# Patient Record
Sex: Male | Born: 1940
Health system: Southern US, Community
[De-identification: ages and names within clinical notes are randomized; demographics above are authoritative.]

## PROBLEM LIST (undated history)

## (undated) DIAGNOSIS — C801 Malignant (primary) neoplasm, unspecified: Secondary | ICD-10-CM

## (undated) DIAGNOSIS — M1990 Unspecified osteoarthritis, unspecified site: Secondary | ICD-10-CM

## (undated) DIAGNOSIS — G4730 Sleep apnea, unspecified: Secondary | ICD-10-CM

## (undated) DIAGNOSIS — M9260 Juvenile osteochondrosis of tarsus, unspecified ankle: Secondary | ICD-10-CM

## (undated) DIAGNOSIS — E785 Hyperlipidemia, unspecified: Secondary | ICD-10-CM

## (undated) DIAGNOSIS — I10 Essential (primary) hypertension: Secondary | ICD-10-CM

## (undated) DIAGNOSIS — K219 Gastro-esophageal reflux disease without esophagitis: Secondary | ICD-10-CM

## (undated) DIAGNOSIS — E119 Type 2 diabetes mellitus without complications: Secondary | ICD-10-CM

## (undated) DIAGNOSIS — Z87442 Personal history of urinary calculi: Secondary | ICD-10-CM

## (undated) DIAGNOSIS — C61 Malignant neoplasm of prostate: Secondary | ICD-10-CM

## (undated) HISTORY — DX: Type 2 diabetes mellitus without complications: E11.9

## (undated) HISTORY — PX: OTHER SURGICAL HISTORY: SHX169

## (undated) HISTORY — DX: Essential (primary) hypertension: I10

## (undated) HISTORY — PX: APPENDECTOMY: SHX54

## (undated) HISTORY — DX: Hyperlipidemia, unspecified: E78.5

## (undated) HISTORY — DX: Gastro-esophageal reflux disease without esophagitis: K21.9

## (undated) HISTORY — PX: REPLACEMENT TOTAL KNEE: SUR1224

---

## 2006-09-04 HISTORY — PX: COLONOSCOPY: SHX174

## 2007-09-05 HISTORY — PX: JOINT REPLACEMENT: SHX530

## 2010-09-04 HISTORY — PX: PROSTATE BIOPSY: SHX241

## 2011-09-12 DIAGNOSIS — R972 Elevated prostate specific antigen [PSA]: Secondary | ICD-10-CM | POA: Diagnosis not present

## 2011-09-21 DIAGNOSIS — N318 Other neuromuscular dysfunction of bladder: Secondary | ICD-10-CM | POA: Diagnosis not present

## 2011-09-21 DIAGNOSIS — R972 Elevated prostate specific antigen [PSA]: Secondary | ICD-10-CM | POA: Diagnosis not present

## 2011-10-06 DIAGNOSIS — E119 Type 2 diabetes mellitus without complications: Secondary | ICD-10-CM | POA: Diagnosis not present

## 2011-10-06 DIAGNOSIS — D510 Vitamin B12 deficiency anemia due to intrinsic factor deficiency: Secondary | ICD-10-CM | POA: Diagnosis not present

## 2012-03-27 DIAGNOSIS — Z0000 Encounter for general adult medical examination without abnormal findings: Secondary | ICD-10-CM | POA: Diagnosis not present

## 2012-03-27 DIAGNOSIS — D510 Vitamin B12 deficiency anemia due to intrinsic factor deficiency: Secondary | ICD-10-CM | POA: Diagnosis not present

## 2012-03-27 DIAGNOSIS — Z79899 Other long term (current) drug therapy: Secondary | ICD-10-CM | POA: Diagnosis not present

## 2012-03-27 DIAGNOSIS — E119 Type 2 diabetes mellitus without complications: Secondary | ICD-10-CM | POA: Diagnosis not present

## 2012-03-27 DIAGNOSIS — Z125 Encounter for screening for malignant neoplasm of prostate: Secondary | ICD-10-CM | POA: Diagnosis not present

## 2012-05-08 DIAGNOSIS — Z1211 Encounter for screening for malignant neoplasm of colon: Secondary | ICD-10-CM | POA: Diagnosis not present

## 2012-05-20 DIAGNOSIS — D128 Benign neoplasm of rectum: Secondary | ICD-10-CM | POA: Diagnosis not present

## 2012-05-20 DIAGNOSIS — Z1211 Encounter for screening for malignant neoplasm of colon: Secondary | ICD-10-CM | POA: Diagnosis not present

## 2012-05-20 DIAGNOSIS — K620 Anal polyp: Secondary | ICD-10-CM | POA: Diagnosis not present

## 2012-05-20 DIAGNOSIS — K621 Rectal polyp: Secondary | ICD-10-CM | POA: Diagnosis not present

## 2012-05-20 DIAGNOSIS — Z800 Family history of malignant neoplasm of digestive organs: Secondary | ICD-10-CM | POA: Diagnosis not present

## 2012-05-20 DIAGNOSIS — K648 Other hemorrhoids: Secondary | ICD-10-CM | POA: Diagnosis not present

## 2012-05-20 DIAGNOSIS — Z86010 Personal history of colonic polyps: Secondary | ICD-10-CM | POA: Diagnosis not present

## 2012-05-20 DIAGNOSIS — K5730 Diverticulosis of large intestine without perforation or abscess without bleeding: Secondary | ICD-10-CM | POA: Diagnosis not present

## 2012-05-20 DIAGNOSIS — D129 Benign neoplasm of anus and anal canal: Secondary | ICD-10-CM | POA: Diagnosis not present

## 2012-05-21 DIAGNOSIS — R030 Elevated blood-pressure reading, without diagnosis of hypertension: Secondary | ICD-10-CM | POA: Diagnosis not present

## 2012-07-04 DIAGNOSIS — IMO0002 Reserved for concepts with insufficient information to code with codable children: Secondary | ICD-10-CM | POA: Diagnosis not present

## 2012-07-04 DIAGNOSIS — M1710 Unilateral primary osteoarthritis, unspecified knee: Secondary | ICD-10-CM | POA: Diagnosis not present

## 2012-07-16 DIAGNOSIS — M1710 Unilateral primary osteoarthritis, unspecified knee: Secondary | ICD-10-CM | POA: Diagnosis not present

## 2012-07-16 DIAGNOSIS — IMO0002 Reserved for concepts with insufficient information to code with codable children: Secondary | ICD-10-CM | POA: Diagnosis not present

## 2012-08-12 ENCOUNTER — Ambulatory Visit: Payer: Self-pay | Admitting: General Practice

## 2012-08-12 LAB — URINALYSIS, COMPLETE
Bacteria: NONE SEEN
Blood: NEGATIVE
Leukocyte Esterase: NEGATIVE
Nitrite: NEGATIVE
Ph: 5 (ref 4.5–8.0)
Protein: NEGATIVE
RBC,UR: 1 /HPF (ref 0–5)
Squamous Epithelial: NONE SEEN

## 2012-08-12 LAB — BASIC METABOLIC PANEL
Anion Gap: 7 (ref 7–16)
BUN: 18 mg/dL (ref 7–18)
Calcium, Total: 9.4 mg/dL (ref 8.5–10.1)
Creatinine: 0.81 mg/dL (ref 0.60–1.30)
EGFR (Non-African Amer.): >60
Glucose: 214 mg/dL — ABNORMAL HIGH (ref 65–99)
Osmolality: 280 (ref 275–301)
Potassium: 4.1 mmol/L (ref 3.5–5.1)

## 2012-08-12 LAB — CBC
HGB: 15.8 g/dL (ref 13.0–18.0)
MCH: 34.1 pg — ABNORMAL HIGH (ref 26.0–34.0)
MCHC: 34.2 g/dL (ref 32.0–36.0)
MCV: 100 fL (ref 80–100)
Platelet: 192 10*3/uL (ref 150–440)
RBC: 4.63 10*6/uL (ref 4.40–5.90)
RDW: 12.5 % (ref 11.5–14.5)

## 2012-08-12 LAB — APTT: Activated PTT: 32.7 secs (ref 23.6–35.9)

## 2012-08-12 LAB — PROTIME-INR
INR: 1
Prothrombin Time: 13.4 secs (ref 11.5–14.7)

## 2012-08-13 LAB — URINE CULTURE

## 2012-08-26 ENCOUNTER — Inpatient Hospital Stay: Payer: Self-pay | Admitting: General Practice

## 2012-08-26 DIAGNOSIS — Z96659 Presence of unspecified artificial knee joint: Secondary | ICD-10-CM | POA: Diagnosis not present

## 2012-08-26 DIAGNOSIS — M1710 Unilateral primary osteoarthritis, unspecified knee: Secondary | ICD-10-CM | POA: Diagnosis not present

## 2012-08-26 DIAGNOSIS — G8918 Other acute postprocedural pain: Secondary | ICD-10-CM | POA: Diagnosis not present

## 2012-08-26 DIAGNOSIS — Z471 Aftercare following joint replacement surgery: Secondary | ICD-10-CM | POA: Diagnosis not present

## 2012-08-26 DIAGNOSIS — M25569 Pain in unspecified knee: Secondary | ICD-10-CM | POA: Diagnosis not present

## 2012-08-26 DIAGNOSIS — IMO0002 Reserved for concepts with insufficient information to code with codable children: Secondary | ICD-10-CM | POA: Diagnosis not present

## 2012-08-27 LAB — BASIC METABOLIC PANEL
Anion Gap: 8 (ref 7–16)
Chloride: 106 mmol/L (ref 98–107)
Creatinine: 0.8 mg/dL (ref 0.60–1.30)
EGFR (Non-African Amer.): >60
Glucose: 188 mg/dL — ABNORMAL HIGH (ref 65–99)
Osmolality: 280 (ref 275–301)

## 2012-08-27 LAB — PLATELET COUNT: Platelet: 169 10*3/uL (ref 150–440)

## 2012-08-28 DIAGNOSIS — R918 Other nonspecific abnormal finding of lung field: Secondary | ICD-10-CM | POA: Diagnosis not present

## 2012-08-28 DIAGNOSIS — I499 Cardiac arrhythmia, unspecified: Secondary | ICD-10-CM

## 2012-08-28 LAB — HEMOGLOBIN: HGB: 13.6 g/dL (ref 13.0–18.0)

## 2012-08-28 LAB — BASIC METABOLIC PANEL
Anion Gap: 6 — ABNORMAL LOW (ref 7–16)
BUN: 8 mg/dL (ref 7–18)
Calcium, Total: 8.3 mg/dL — ABNORMAL LOW (ref 8.5–10.1)
Chloride: 103 mmol/L (ref 98–107)
Co2: 25 mmol/L (ref 21–32)
Creatinine: 0.61 mg/dL (ref 0.60–1.30)
EGFR (African American): >60
Osmolality: 271 (ref 275–301)
Potassium: 3.8 mmol/L (ref 3.5–5.1)

## 2012-08-28 LAB — PLATELET COUNT: Platelet: 181 10*3/uL (ref 150–440)

## 2012-08-31 DIAGNOSIS — Z471 Aftercare following joint replacement surgery: Secondary | ICD-10-CM | POA: Diagnosis not present

## 2012-08-31 DIAGNOSIS — Z96659 Presence of unspecified artificial knee joint: Secondary | ICD-10-CM | POA: Diagnosis not present

## 2012-08-31 DIAGNOSIS — IMO0001 Reserved for inherently not codable concepts without codable children: Secondary | ICD-10-CM | POA: Diagnosis not present

## 2012-08-31 DIAGNOSIS — N189 Chronic kidney disease, unspecified: Secondary | ICD-10-CM | POA: Diagnosis not present

## 2012-08-31 DIAGNOSIS — E119 Type 2 diabetes mellitus without complications: Secondary | ICD-10-CM | POA: Diagnosis not present

## 2012-09-02 DIAGNOSIS — IMO0001 Reserved for inherently not codable concepts without codable children: Secondary | ICD-10-CM | POA: Diagnosis not present

## 2012-09-02 DIAGNOSIS — E119 Type 2 diabetes mellitus without complications: Secondary | ICD-10-CM | POA: Diagnosis not present

## 2012-09-02 DIAGNOSIS — Z471 Aftercare following joint replacement surgery: Secondary | ICD-10-CM | POA: Diagnosis not present

## 2012-09-02 DIAGNOSIS — Z96659 Presence of unspecified artificial knee joint: Secondary | ICD-10-CM | POA: Diagnosis not present

## 2012-09-02 DIAGNOSIS — N189 Chronic kidney disease, unspecified: Secondary | ICD-10-CM | POA: Diagnosis not present

## 2012-09-03 DIAGNOSIS — Z471 Aftercare following joint replacement surgery: Secondary | ICD-10-CM | POA: Diagnosis not present

## 2012-09-03 DIAGNOSIS — IMO0001 Reserved for inherently not codable concepts without codable children: Secondary | ICD-10-CM | POA: Diagnosis not present

## 2012-09-03 DIAGNOSIS — E119 Type 2 diabetes mellitus without complications: Secondary | ICD-10-CM | POA: Diagnosis not present

## 2012-09-03 DIAGNOSIS — N189 Chronic kidney disease, unspecified: Secondary | ICD-10-CM | POA: Diagnosis not present

## 2012-09-03 DIAGNOSIS — Z96659 Presence of unspecified artificial knee joint: Secondary | ICD-10-CM | POA: Diagnosis not present

## 2012-09-04 DIAGNOSIS — N189 Chronic kidney disease, unspecified: Secondary | ICD-10-CM | POA: Diagnosis not present

## 2012-09-04 DIAGNOSIS — Z96659 Presence of unspecified artificial knee joint: Secondary | ICD-10-CM | POA: Diagnosis not present

## 2012-09-04 DIAGNOSIS — Z471 Aftercare following joint replacement surgery: Secondary | ICD-10-CM | POA: Diagnosis not present

## 2012-09-04 DIAGNOSIS — IMO0001 Reserved for inherently not codable concepts without codable children: Secondary | ICD-10-CM | POA: Diagnosis not present

## 2012-09-04 DIAGNOSIS — E119 Type 2 diabetes mellitus without complications: Secondary | ICD-10-CM | POA: Diagnosis not present

## 2012-09-05 ENCOUNTER — Emergency Department: Payer: Self-pay | Admitting: Emergency Medicine

## 2012-09-05 DIAGNOSIS — R61 Generalized hyperhidrosis: Secondary | ICD-10-CM | POA: Diagnosis not present

## 2012-09-05 DIAGNOSIS — Z79899 Other long term (current) drug therapy: Secondary | ICD-10-CM | POA: Diagnosis not present

## 2012-09-05 DIAGNOSIS — I10 Essential (primary) hypertension: Secondary | ICD-10-CM | POA: Diagnosis not present

## 2012-09-05 DIAGNOSIS — R000 Tachycardia, unspecified: Secondary | ICD-10-CM | POA: Diagnosis not present

## 2012-09-05 DIAGNOSIS — Z471 Aftercare following joint replacement surgery: Secondary | ICD-10-CM | POA: Diagnosis not present

## 2012-09-05 DIAGNOSIS — IMO0001 Reserved for inherently not codable concepts without codable children: Secondary | ICD-10-CM | POA: Diagnosis not present

## 2012-09-05 DIAGNOSIS — Z96659 Presence of unspecified artificial knee joint: Secondary | ICD-10-CM | POA: Diagnosis not present

## 2012-09-05 DIAGNOSIS — E119 Type 2 diabetes mellitus without complications: Secondary | ICD-10-CM | POA: Diagnosis not present

## 2012-09-05 DIAGNOSIS — N189 Chronic kidney disease, unspecified: Secondary | ICD-10-CM | POA: Diagnosis not present

## 2012-09-05 DIAGNOSIS — Z9889 Other specified postprocedural states: Secondary | ICD-10-CM | POA: Diagnosis not present

## 2012-09-05 LAB — BASIC METABOLIC PANEL
Anion Gap: 8 (ref 7–16)
BUN: 12 mg/dL (ref 7–18)
Calcium, Total: 9.5 mg/dL (ref 8.5–10.1)
Co2: 25 mmol/L (ref 21–32)
Creatinine: 0.81 mg/dL (ref 0.60–1.30)
EGFR (African American): >60
EGFR (Non-African Amer.): >60
Glucose: 143 mg/dL — ABNORMAL HIGH (ref 65–99)
Sodium: 133 mmol/L — ABNORMAL LOW (ref 136–145)

## 2012-09-05 LAB — CBC WITH DIFFERENTIAL/PLATELET
Basophil #: 0.1 10*3/uL (ref 0.0–0.1)
Basophil %: 0.8 %
Eosinophil #: 0.2 10*3/uL (ref 0.0–0.7)
HCT: 44.2 % (ref 40.0–52.0)
HGB: 15 g/dL (ref 13.0–18.0)
MCH: 33.4 pg (ref 26.0–34.0)
MCHC: 34 g/dL (ref 32.0–36.0)
MCV: 98 fL (ref 80–100)
Monocyte #: 1.1 x10 3/mm — ABNORMAL HIGH (ref 0.2–1.0)
Neutrophil #: 10.2 10*3/uL — ABNORMAL HIGH (ref 1.4–6.5)
Platelet: 356 10*3/uL (ref 150–440)
RBC: 4.5 10*6/uL (ref 4.40–5.90)
RDW: 12.7 % (ref 11.5–14.5)
WBC: 13.4 10*3/uL — ABNORMAL HIGH (ref 3.8–10.6)

## 2012-09-05 LAB — URINALYSIS, COMPLETE
Bacteria: NONE SEEN
Bilirubin,UR: NEGATIVE
Glucose,UR: NEGATIVE mg/dL (ref 0–75)
Ketone: NEGATIVE
Ph: 6 (ref 4.5–8.0)
RBC,UR: 1 /HPF (ref 0–5)
Specific Gravity: 1.005 (ref 1.003–1.030)
Squamous Epithelial: <1
WBC UR: 3 /HPF (ref 0–5)

## 2012-09-05 LAB — TROPONIN I: Troponin-I: <0.02 ng/mL

## 2012-09-06 DIAGNOSIS — Z471 Aftercare following joint replacement surgery: Secondary | ICD-10-CM | POA: Diagnosis not present

## 2012-09-06 DIAGNOSIS — Z96659 Presence of unspecified artificial knee joint: Secondary | ICD-10-CM | POA: Diagnosis not present

## 2012-09-06 DIAGNOSIS — E119 Type 2 diabetes mellitus without complications: Secondary | ICD-10-CM | POA: Diagnosis not present

## 2012-09-06 DIAGNOSIS — N189 Chronic kidney disease, unspecified: Secondary | ICD-10-CM | POA: Diagnosis not present

## 2012-09-06 DIAGNOSIS — IMO0001 Reserved for inherently not codable concepts without codable children: Secondary | ICD-10-CM | POA: Diagnosis not present

## 2012-09-09 DIAGNOSIS — Z96659 Presence of unspecified artificial knee joint: Secondary | ICD-10-CM | POA: Diagnosis not present

## 2012-09-09 DIAGNOSIS — Z471 Aftercare following joint replacement surgery: Secondary | ICD-10-CM | POA: Diagnosis not present

## 2012-09-09 DIAGNOSIS — E119 Type 2 diabetes mellitus without complications: Secondary | ICD-10-CM | POA: Diagnosis not present

## 2012-09-09 DIAGNOSIS — N189 Chronic kidney disease, unspecified: Secondary | ICD-10-CM | POA: Diagnosis not present

## 2012-09-09 DIAGNOSIS — IMO0001 Reserved for inherently not codable concepts without codable children: Secondary | ICD-10-CM | POA: Diagnosis not present

## 2012-09-10 DIAGNOSIS — Z96659 Presence of unspecified artificial knee joint: Secondary | ICD-10-CM | POA: Diagnosis not present

## 2012-09-10 DIAGNOSIS — M25569 Pain in unspecified knee: Secondary | ICD-10-CM | POA: Diagnosis not present

## 2012-09-10 DIAGNOSIS — M25669 Stiffness of unspecified knee, not elsewhere classified: Secondary | ICD-10-CM | POA: Diagnosis not present

## 2012-09-10 DIAGNOSIS — M6281 Muscle weakness (generalized): Secondary | ICD-10-CM | POA: Diagnosis not present

## 2012-09-13 DIAGNOSIS — M25669 Stiffness of unspecified knee, not elsewhere classified: Secondary | ICD-10-CM | POA: Diagnosis not present

## 2012-09-13 DIAGNOSIS — Z96659 Presence of unspecified artificial knee joint: Secondary | ICD-10-CM | POA: Diagnosis not present

## 2012-09-13 DIAGNOSIS — M6281 Muscle weakness (generalized): Secondary | ICD-10-CM | POA: Diagnosis not present

## 2012-09-13 DIAGNOSIS — M25569 Pain in unspecified knee: Secondary | ICD-10-CM | POA: Diagnosis not present

## 2012-09-16 DIAGNOSIS — M25669 Stiffness of unspecified knee, not elsewhere classified: Secondary | ICD-10-CM | POA: Diagnosis not present

## 2012-09-16 DIAGNOSIS — M6281 Muscle weakness (generalized): Secondary | ICD-10-CM | POA: Diagnosis not present

## 2012-09-16 DIAGNOSIS — Z96659 Presence of unspecified artificial knee joint: Secondary | ICD-10-CM | POA: Diagnosis not present

## 2012-09-16 DIAGNOSIS — M25569 Pain in unspecified knee: Secondary | ICD-10-CM | POA: Diagnosis not present

## 2012-09-19 DIAGNOSIS — M25669 Stiffness of unspecified knee, not elsewhere classified: Secondary | ICD-10-CM | POA: Diagnosis not present

## 2012-09-19 DIAGNOSIS — M6281 Muscle weakness (generalized): Secondary | ICD-10-CM | POA: Diagnosis not present

## 2012-09-19 DIAGNOSIS — Z96659 Presence of unspecified artificial knee joint: Secondary | ICD-10-CM | POA: Diagnosis not present

## 2012-09-19 DIAGNOSIS — M25569 Pain in unspecified knee: Secondary | ICD-10-CM | POA: Diagnosis not present

## 2012-09-23 DIAGNOSIS — M25569 Pain in unspecified knee: Secondary | ICD-10-CM | POA: Diagnosis not present

## 2012-09-23 DIAGNOSIS — M25669 Stiffness of unspecified knee, not elsewhere classified: Secondary | ICD-10-CM | POA: Diagnosis not present

## 2012-09-23 DIAGNOSIS — M6281 Muscle weakness (generalized): Secondary | ICD-10-CM | POA: Diagnosis not present

## 2012-09-23 DIAGNOSIS — Z96659 Presence of unspecified artificial knee joint: Secondary | ICD-10-CM | POA: Diagnosis not present

## 2012-09-27 DIAGNOSIS — Z96659 Presence of unspecified artificial knee joint: Secondary | ICD-10-CM | POA: Diagnosis not present

## 2012-09-27 DIAGNOSIS — M25669 Stiffness of unspecified knee, not elsewhere classified: Secondary | ICD-10-CM | POA: Diagnosis not present

## 2012-09-27 DIAGNOSIS — M25569 Pain in unspecified knee: Secondary | ICD-10-CM | POA: Diagnosis not present

## 2012-09-27 DIAGNOSIS — M6281 Muscle weakness (generalized): Secondary | ICD-10-CM | POA: Diagnosis not present

## 2012-09-30 DIAGNOSIS — Z96659 Presence of unspecified artificial knee joint: Secondary | ICD-10-CM | POA: Diagnosis not present

## 2012-09-30 DIAGNOSIS — M25569 Pain in unspecified knee: Secondary | ICD-10-CM | POA: Diagnosis not present

## 2012-09-30 DIAGNOSIS — M25669 Stiffness of unspecified knee, not elsewhere classified: Secondary | ICD-10-CM | POA: Diagnosis not present

## 2012-09-30 DIAGNOSIS — M6281 Muscle weakness (generalized): Secondary | ICD-10-CM | POA: Diagnosis not present

## 2012-10-07 DIAGNOSIS — M25669 Stiffness of unspecified knee, not elsewhere classified: Secondary | ICD-10-CM | POA: Diagnosis not present

## 2012-10-07 DIAGNOSIS — M25569 Pain in unspecified knee: Secondary | ICD-10-CM | POA: Diagnosis not present

## 2012-10-07 DIAGNOSIS — M6281 Muscle weakness (generalized): Secondary | ICD-10-CM | POA: Diagnosis not present

## 2012-10-07 DIAGNOSIS — Z96659 Presence of unspecified artificial knee joint: Secondary | ICD-10-CM | POA: Diagnosis not present

## 2012-10-08 DIAGNOSIS — Z96659 Presence of unspecified artificial knee joint: Secondary | ICD-10-CM | POA: Diagnosis not present

## 2012-10-14 DIAGNOSIS — E1149 Type 2 diabetes mellitus with other diabetic neurological complication: Secondary | ICD-10-CM | POA: Diagnosis not present

## 2012-10-14 DIAGNOSIS — B351 Tinea unguium: Secondary | ICD-10-CM | POA: Diagnosis not present

## 2012-10-14 DIAGNOSIS — L851 Acquired keratosis [keratoderma] palmaris et plantaris: Secondary | ICD-10-CM | POA: Diagnosis not present

## 2012-10-14 DIAGNOSIS — G909 Disorder of the autonomic nervous system, unspecified: Secondary | ICD-10-CM | POA: Diagnosis not present

## 2012-11-27 DIAGNOSIS — E119 Type 2 diabetes mellitus without complications: Secondary | ICD-10-CM | POA: Diagnosis not present

## 2013-01-13 DIAGNOSIS — M7730 Calcaneal spur, unspecified foot: Secondary | ICD-10-CM | POA: Diagnosis not present

## 2013-01-13 DIAGNOSIS — M7660 Achilles tendinitis, unspecified leg: Secondary | ICD-10-CM | POA: Diagnosis not present

## 2013-02-26 DIAGNOSIS — M6528 Calcific tendinitis, other site: Secondary | ICD-10-CM | POA: Insufficient documentation

## 2013-02-26 DIAGNOSIS — M79609 Pain in unspecified limb: Secondary | ICD-10-CM | POA: Diagnosis not present

## 2013-02-26 DIAGNOSIS — M7660 Achilles tendinitis, unspecified leg: Secondary | ICD-10-CM | POA: Insufficient documentation

## 2013-02-26 DIAGNOSIS — M7730 Calcaneal spur, unspecified foot: Secondary | ICD-10-CM | POA: Insufficient documentation

## 2013-04-08 DIAGNOSIS — M7660 Achilles tendinitis, unspecified leg: Secondary | ICD-10-CM | POA: Diagnosis not present

## 2013-04-08 DIAGNOSIS — T148XXA Other injury of unspecified body region, initial encounter: Secondary | ICD-10-CM | POA: Diagnosis not present

## 2013-04-08 DIAGNOSIS — M7730 Calcaneal spur, unspecified foot: Secondary | ICD-10-CM | POA: Diagnosis not present

## 2013-04-09 DIAGNOSIS — M659 Synovitis and tenosynovitis, unspecified: Secondary | ICD-10-CM | POA: Diagnosis not present

## 2013-04-09 DIAGNOSIS — M7660 Achilles tendinitis, unspecified leg: Secondary | ICD-10-CM | POA: Diagnosis not present

## 2013-05-01 DIAGNOSIS — N529 Male erectile dysfunction, unspecified: Secondary | ICD-10-CM | POA: Diagnosis not present

## 2013-05-01 DIAGNOSIS — G5600 Carpal tunnel syndrome, unspecified upper limb: Secondary | ICD-10-CM | POA: Diagnosis not present

## 2013-05-20 DIAGNOSIS — M659 Synovitis and tenosynovitis, unspecified: Secondary | ICD-10-CM | POA: Diagnosis not present

## 2013-05-20 DIAGNOSIS — M7660 Achilles tendinitis, unspecified leg: Secondary | ICD-10-CM | POA: Diagnosis not present

## 2013-05-24 IMAGING — CT CT CHEST W/ CM
1 series · 15 of 32 positions shown, 19 images · IV contrast (APPLIED)
Comparison: none

REASON FOR EXAM: TACHYCARDIA AND SWEATING, RECENT SURGERY
COMMENTS:

PROCEDURE:     CT  - CT CHEST (FOR PE) W  - September 05, 2012  [DATE]
RESULT:     Comparison: None
TECHNIQUE: Multiple thin section axial images were obtained from the lung
apices to the upper abdomen following 100 ml Isovue 370 intravenous
contrast, according to the PE protocol. These images were also reviewed on a
[employer] multiplanar work station.

[Series 4: soft tissue · axial · 0.82mm/px · z∈[-345,-84]mm · 15 of 99 slices shown, 19 images]
[im 8/99  mediastinal]
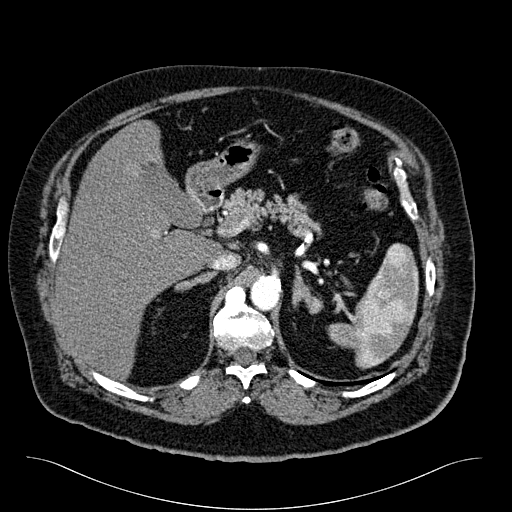
[im 8/99  lung]
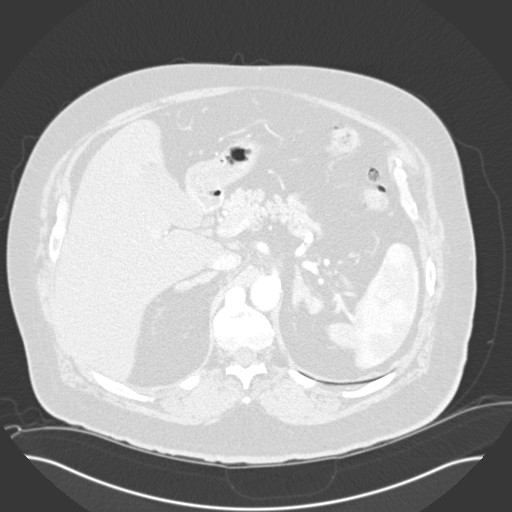
[im 15/99  lung]
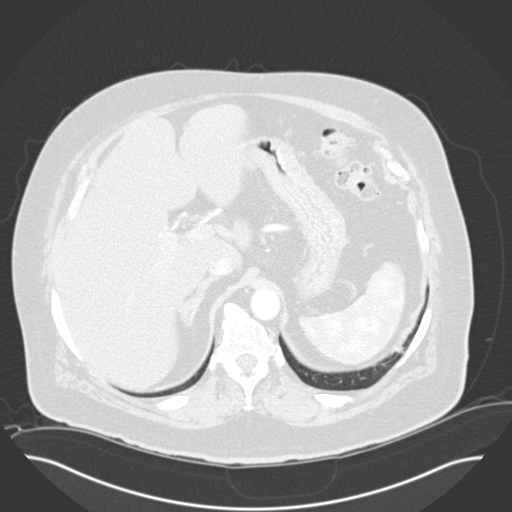
[im 20/99  lung]
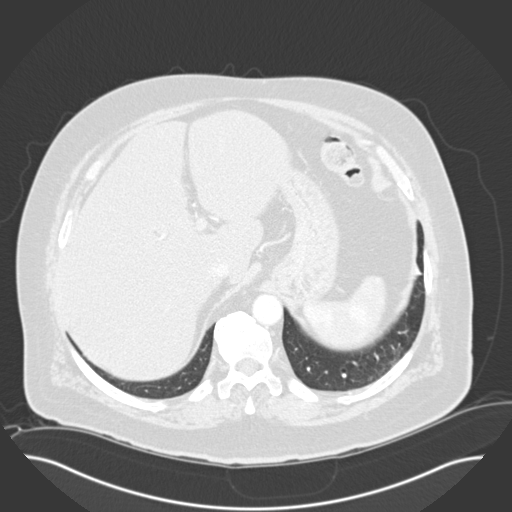
[im 26/99  lung]
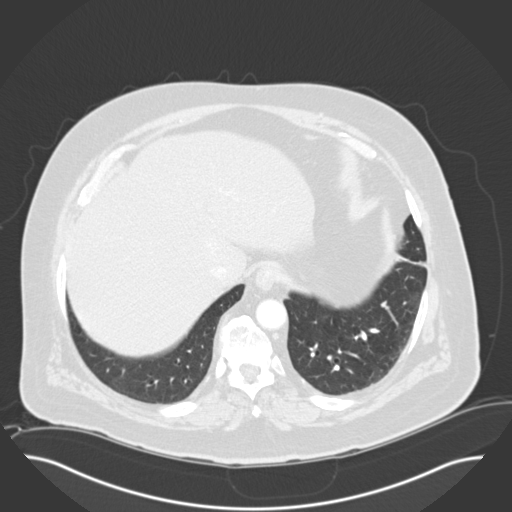
[im 33/99  mediastinal]
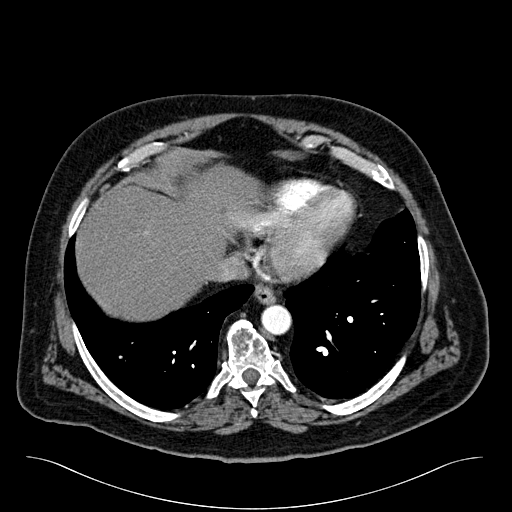
[im 33/99  lung]
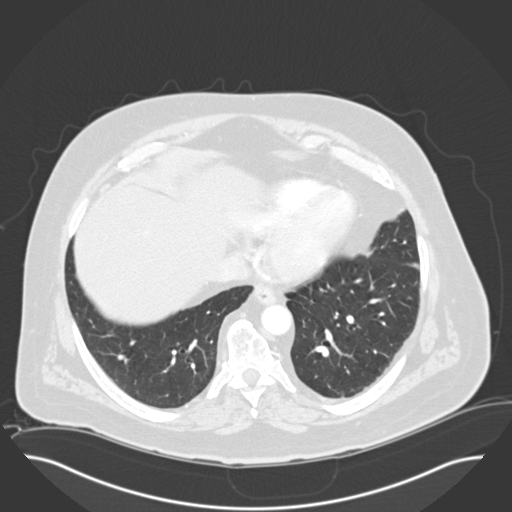
[im 40/99  lung]
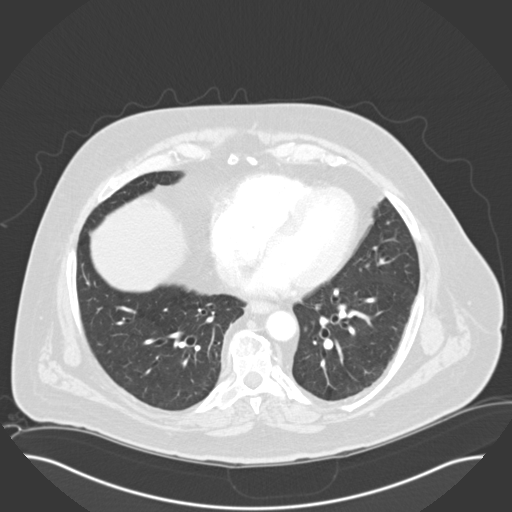
[im 47/99  lung]
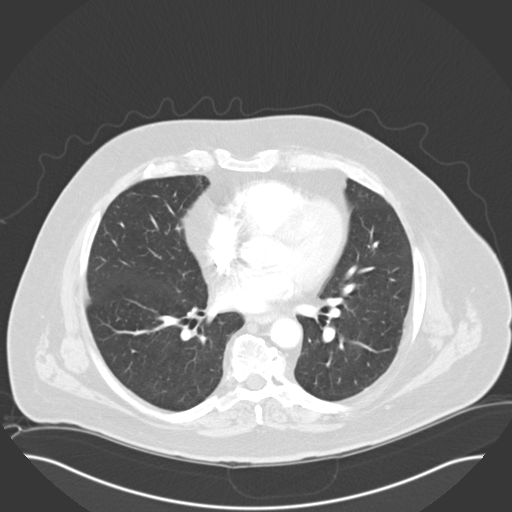
[im 51/99  lung]
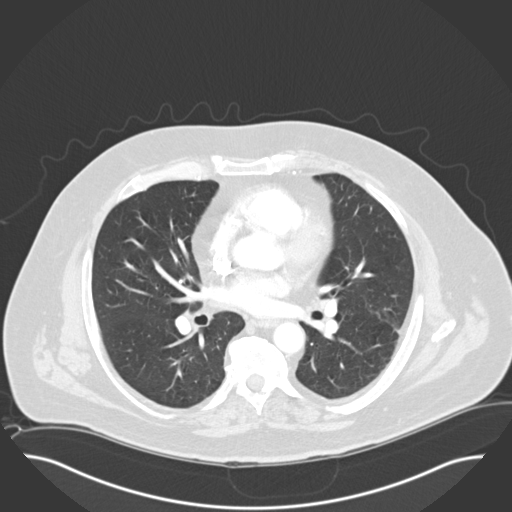
[im 55/99  mediastinal]
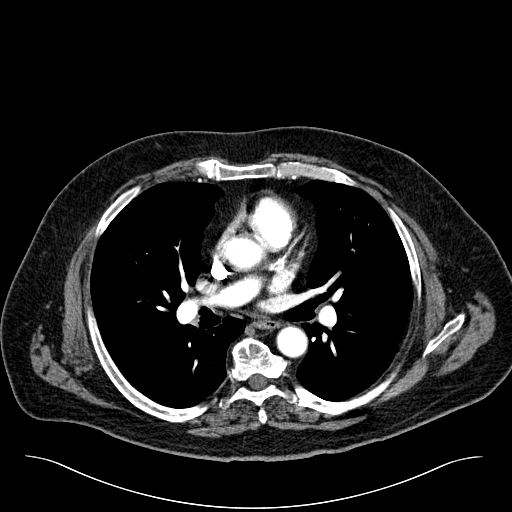
[im 55/99  lung]
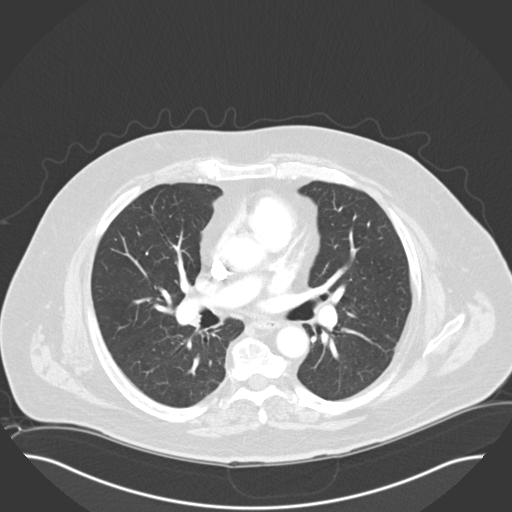
[im 62/99  lung]
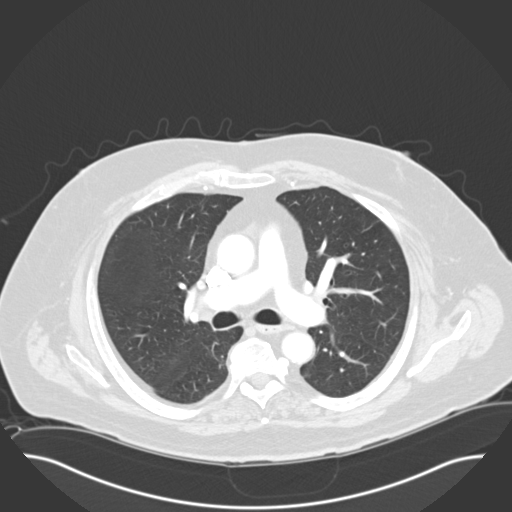
[im 69/99  lung]
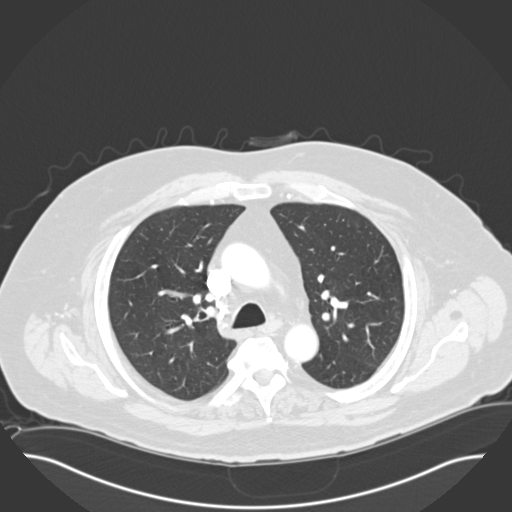
[im 77/99  lung]
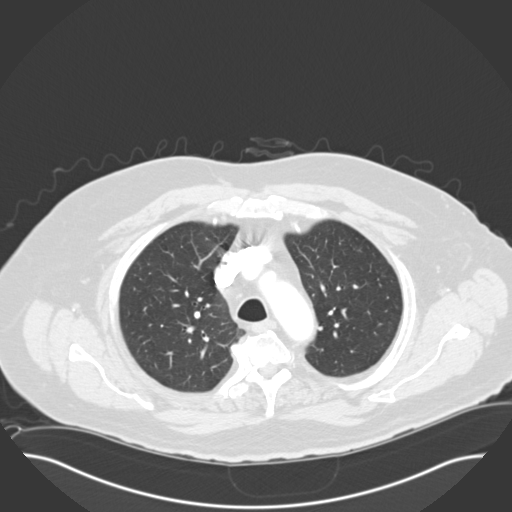
[im 80/99  mediastinal]
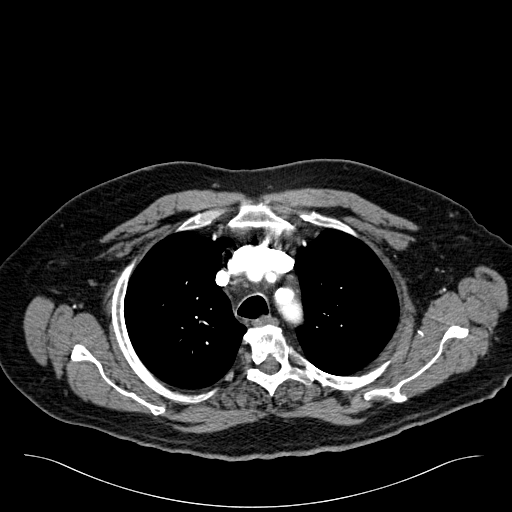
[im 80/99  lung]
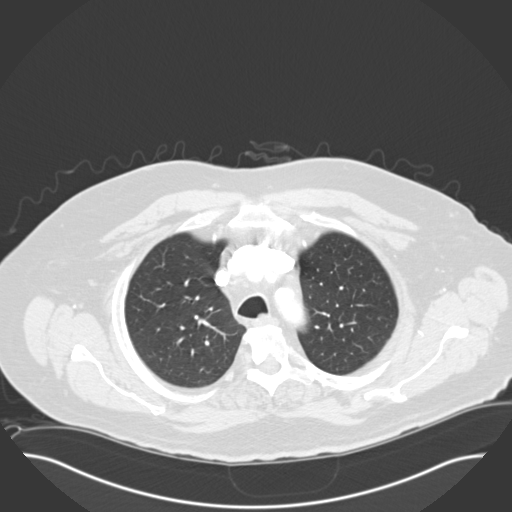
[im 88/99  lung]
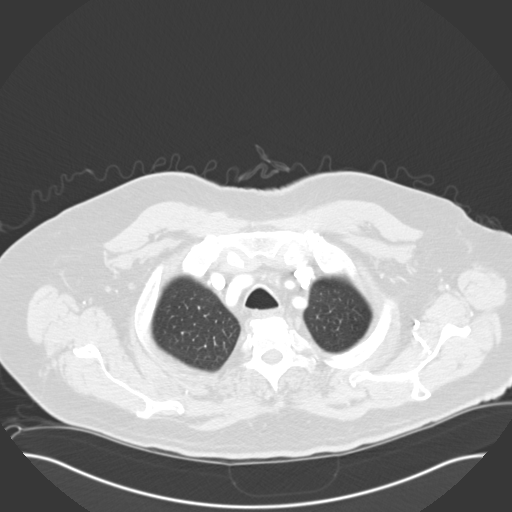
[im 95/99  lung]
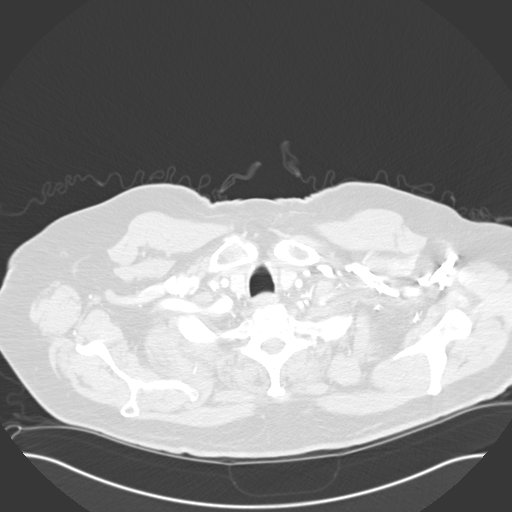

[15 of 32 positions shown; findings below may reference images not displayed]

FINDINGS: No mediastinal, hilar or, or axillary lymphadenopathy. There are
calcifications in the coronary arteries. Calcified stones are present within
the gallbladder. There is mild thickening of the left adrenal gland, without
definite discrete mass.

The thoracic aorta is normal in caliber. There is poor opacification of the
segmental pulmonary arteries in the upper lobes, which limits evaluation in
this region.

Minimal basilar opacities are likely secondary to atelectasis. Minimal
dependent low-attenuation in the trachea likely represents retained mucus.

No aggressive lytic or sclerotic osseous lesions are identified.
IMPRESSION: 1. No pulmonary embolus identified. Evaluation of the segmental pulmonary
arteries in the upper lobes is limited.
2. Cholelithiasis.

## 2013-05-28 DIAGNOSIS — Z125 Encounter for screening for malignant neoplasm of prostate: Secondary | ICD-10-CM | POA: Diagnosis not present

## 2013-05-28 DIAGNOSIS — E119 Type 2 diabetes mellitus without complications: Secondary | ICD-10-CM | POA: Diagnosis not present

## 2013-05-28 DIAGNOSIS — Z79899 Other long term (current) drug therapy: Secondary | ICD-10-CM | POA: Diagnosis not present

## 2013-05-28 DIAGNOSIS — Z0000 Encounter for general adult medical examination without abnormal findings: Secondary | ICD-10-CM | POA: Diagnosis not present

## 2013-07-18 DIAGNOSIS — N400 Enlarged prostate without lower urinary tract symptoms: Secondary | ICD-10-CM | POA: Diagnosis not present

## 2013-07-18 DIAGNOSIS — R972 Elevated prostate specific antigen [PSA]: Secondary | ICD-10-CM | POA: Diagnosis not present

## 2013-07-18 DIAGNOSIS — N390 Urinary tract infection, site not specified: Secondary | ICD-10-CM | POA: Diagnosis not present

## 2013-07-20 DIAGNOSIS — R972 Elevated prostate specific antigen [PSA]: Secondary | ICD-10-CM | POA: Insufficient documentation

## 2013-08-19 DIAGNOSIS — C61 Malignant neoplasm of prostate: Secondary | ICD-10-CM | POA: Diagnosis not present

## 2013-08-19 DIAGNOSIS — R972 Elevated prostate specific antigen [PSA]: Secondary | ICD-10-CM | POA: Diagnosis not present

## 2013-08-22 ENCOUNTER — Inpatient Hospital Stay: Payer: Self-pay | Admitting: Specialist

## 2013-08-22 DIAGNOSIS — I498 Other specified cardiac arrhythmias: Secondary | ICD-10-CM | POA: Diagnosis not present

## 2013-08-22 DIAGNOSIS — N390 Urinary tract infection, site not specified: Secondary | ICD-10-CM | POA: Diagnosis not present

## 2013-08-22 DIAGNOSIS — A419 Sepsis, unspecified organism: Secondary | ICD-10-CM | POA: Diagnosis not present

## 2013-08-22 DIAGNOSIS — R7881 Bacteremia: Secondary | ICD-10-CM | POA: Diagnosis not present

## 2013-08-22 LAB — CBC
HCT: 41.4 % (ref 40.0–52.0)
HGB: 14.1 g/dL (ref 13.0–18.0)
MCH: 33 pg (ref 26.0–34.0)
MCHC: 34 g/dL (ref 32.0–36.0)
MCV: 97 fL (ref 80–100)
Platelet: 135 10*3/uL — ABNORMAL LOW (ref 150–440)
RBC: 4.27 10*6/uL — ABNORMAL LOW (ref 4.40–5.90)
RDW: 12.6 % (ref 11.5–14.5)
WBC: 11.6 10*3/uL — ABNORMAL HIGH (ref 3.8–10.6)

## 2013-08-22 LAB — URINALYSIS, COMPLETE
Bilirubin,UR: NEGATIVE
Nitrite: POSITIVE
Ph: 5 (ref 4.5–8.0)
Protein: 100
RBC,UR: 6 /HPF (ref 0–5)
Specific Gravity: 1.026 (ref 1.003–1.030)

## 2013-08-22 LAB — COMPREHENSIVE METABOLIC PANEL
Bilirubin,Total: 1 mg/dL (ref 0.2–1.0)
Calcium, Total: 8.9 mg/dL (ref 8.5–10.1)
Chloride: 100 mmol/L (ref 98–107)
Co2: 25 mmol/L (ref 21–32)
Creatinine: 0.92 mg/dL (ref 0.60–1.30)
EGFR (African American): >60
Osmolality: 271 (ref 275–301)
Potassium: 3.5 mmol/L (ref 3.5–5.1)
SGOT(AST): 25 U/L (ref 15–37)

## 2013-08-23 LAB — CBC WITH DIFFERENTIAL/PLATELET
Basophil #: 0.1 10*3/uL (ref 0.0–0.1)
Eosinophil #: 0 10*3/uL (ref 0.0–0.7)
Eosinophil %: 0.1 %
HCT: 38.8 % — ABNORMAL LOW (ref 40.0–52.0)
HGB: 13.3 g/dL (ref 13.0–18.0)
Lymphocyte #: 0.8 10*3/uL — ABNORMAL LOW (ref 1.0–3.6)
Lymphocyte %: 7.2 %
MCH: 33.6 pg (ref 26.0–34.0)
MCHC: 34.3 g/dL (ref 32.0–36.0)
MCV: 98 fL (ref 80–100)
Monocyte #: 0.8 x10 3/mm (ref 0.2–1.0)
Monocyte %: 7.6 %
Neutrophil %: 84.6 %
Platelet: 129 10*3/uL — ABNORMAL LOW (ref 150–440)
RBC: 3.96 10*6/uL — ABNORMAL LOW (ref 4.40–5.90)
RDW: 12.6 % (ref 11.5–14.5)
WBC: 10.9 10*3/uL — ABNORMAL HIGH (ref 3.8–10.6)

## 2013-08-23 LAB — COMPREHENSIVE METABOLIC PANEL
Albumin: 2.8 g/dL — ABNORMAL LOW (ref 3.4–5.0)
Anion Gap: 8 (ref 7–16)
BUN: 10 mg/dL (ref 7–18)
Bilirubin,Total: 0.8 mg/dL (ref 0.2–1.0)
Chloride: 101 mmol/L (ref 98–107)
Creatinine: 0.85 mg/dL (ref 0.60–1.30)
EGFR (African American): >60
EGFR (Non-African Amer.): >60
Glucose: 178 mg/dL — ABNORMAL HIGH (ref 65–99)
Potassium: 3.1 mmol/L — ABNORMAL LOW (ref 3.5–5.1)
SGPT (ALT): 37 U/L (ref 12–78)
Sodium: 132 mmol/L — ABNORMAL LOW (ref 136–145)
Total Protein: 6.3 g/dL — ABNORMAL LOW (ref 6.4–8.2)

## 2013-08-24 LAB — BASIC METABOLIC PANEL
BUN: 8 mg/dL (ref 7–18)
Chloride: 104 mmol/L (ref 98–107)
Co2: 25 mmol/L (ref 21–32)
Creatinine: 0.76 mg/dL (ref 0.60–1.30)
Osmolality: 273 (ref 275–301)

## 2013-08-24 LAB — CBC WITH DIFFERENTIAL/PLATELET
Basophil #: 0.1 10*3/uL (ref 0.0–0.1)
Basophil %: 0.8 %
HGB: 13.5 g/dL (ref 13.0–18.0)
MCH: 33.7 pg (ref 26.0–34.0)
MCHC: 34.4 g/dL (ref 32.0–36.0)
MCV: 98 fL (ref 80–100)
Monocyte #: 0.8 x10 3/mm (ref 0.2–1.0)
Monocyte %: 10.6 %
Neutrophil #: 5.2 10*3/uL (ref 1.4–6.5)
Neutrophil %: 73.3 %
RBC: 4.03 10*6/uL — ABNORMAL LOW (ref 4.40–5.90)
RDW: 12.9 % (ref 11.5–14.5)
WBC: 7.1 10*3/uL (ref 3.8–10.6)

## 2013-08-24 LAB — URINE CULTURE

## 2013-08-27 LAB — CULTURE, BLOOD (SINGLE)

## 2013-09-02 DIAGNOSIS — C61 Malignant neoplasm of prostate: Secondary | ICD-10-CM | POA: Insufficient documentation

## 2013-09-02 DIAGNOSIS — Z8546 Personal history of malignant neoplasm of prostate: Secondary | ICD-10-CM | POA: Insufficient documentation

## 2013-09-02 DIAGNOSIS — A4151 Sepsis due to Escherichia coli [E. coli]: Secondary | ICD-10-CM | POA: Insufficient documentation

## 2013-09-04 HISTORY — PX: JOINT REPLACEMENT: SHX530

## 2013-09-05 DIAGNOSIS — C61 Malignant neoplasm of prostate: Secondary | ICD-10-CM | POA: Diagnosis not present

## 2013-09-05 DIAGNOSIS — N390 Urinary tract infection, site not specified: Secondary | ICD-10-CM | POA: Diagnosis not present

## 2013-09-05 DIAGNOSIS — Z86010 Personal history of colonic polyps: Secondary | ICD-10-CM | POA: Diagnosis not present

## 2013-09-05 DIAGNOSIS — N289 Disorder of kidney and ureter, unspecified: Secondary | ICD-10-CM | POA: Diagnosis not present

## 2013-09-12 DIAGNOSIS — N210 Calculus in bladder: Secondary | ICD-10-CM | POA: Diagnosis not present

## 2013-09-12 DIAGNOSIS — C61 Malignant neoplasm of prostate: Secondary | ICD-10-CM | POA: Diagnosis not present

## 2013-09-16 DIAGNOSIS — C61 Malignant neoplasm of prostate: Secondary | ICD-10-CM | POA: Diagnosis not present

## 2013-09-19 DIAGNOSIS — C61 Malignant neoplasm of prostate: Secondary | ICD-10-CM | POA: Diagnosis not present

## 2013-09-26 DIAGNOSIS — Z800 Family history of malignant neoplasm of digestive organs: Secondary | ICD-10-CM | POA: Diagnosis not present

## 2013-09-26 DIAGNOSIS — D126 Benign neoplasm of colon, unspecified: Secondary | ICD-10-CM | POA: Diagnosis not present

## 2013-10-01 DIAGNOSIS — A419 Sepsis, unspecified organism: Secondary | ICD-10-CM | POA: Diagnosis not present

## 2013-10-01 DIAGNOSIS — N210 Calculus in bladder: Secondary | ICD-10-CM | POA: Diagnosis present

## 2013-10-01 DIAGNOSIS — R319 Hematuria, unspecified: Secondary | ICD-10-CM | POA: Diagnosis not present

## 2013-10-01 DIAGNOSIS — C679 Malignant neoplasm of bladder, unspecified: Secondary | ICD-10-CM | POA: Diagnosis not present

## 2013-10-01 DIAGNOSIS — C61 Malignant neoplasm of prostate: Secondary | ICD-10-CM | POA: Diagnosis not present

## 2013-10-01 DIAGNOSIS — A4151 Sepsis due to Escherichia coli [E. coli]: Secondary | ICD-10-CM | POA: Diagnosis not present

## 2013-10-01 DIAGNOSIS — E119 Type 2 diabetes mellitus without complications: Secondary | ICD-10-CM | POA: Diagnosis not present

## 2013-10-09 DIAGNOSIS — R300 Dysuria: Secondary | ICD-10-CM | POA: Diagnosis not present

## 2013-10-09 DIAGNOSIS — R6883 Chills (without fever): Secondary | ICD-10-CM | POA: Diagnosis not present

## 2013-10-13 ENCOUNTER — Ambulatory Visit: Payer: Self-pay | Admitting: Gastroenterology

## 2013-10-13 DIAGNOSIS — Z86010 Personal history of colonic polyps: Secondary | ICD-10-CM | POA: Diagnosis not present

## 2013-10-13 DIAGNOSIS — D128 Benign neoplasm of rectum: Secondary | ICD-10-CM | POA: Diagnosis not present

## 2013-10-13 DIAGNOSIS — C61 Malignant neoplasm of prostate: Secondary | ICD-10-CM | POA: Diagnosis not present

## 2013-10-13 DIAGNOSIS — I252 Old myocardial infarction: Secondary | ICD-10-CM | POA: Diagnosis not present

## 2013-10-13 DIAGNOSIS — Z09 Encounter for follow-up examination after completed treatment for conditions other than malignant neoplasm: Secondary | ICD-10-CM | POA: Diagnosis not present

## 2013-10-13 DIAGNOSIS — E119 Type 2 diabetes mellitus without complications: Secondary | ICD-10-CM | POA: Diagnosis not present

## 2013-10-13 DIAGNOSIS — D129 Benign neoplasm of anus and anal canal: Secondary | ICD-10-CM | POA: Diagnosis not present

## 2013-10-13 DIAGNOSIS — Z96659 Presence of unspecified artificial knee joint: Secondary | ICD-10-CM | POA: Diagnosis not present

## 2013-10-13 DIAGNOSIS — Z800 Family history of malignant neoplasm of digestive organs: Secondary | ICD-10-CM | POA: Diagnosis not present

## 2013-10-13 DIAGNOSIS — Z79899 Other long term (current) drug therapy: Secondary | ICD-10-CM | POA: Diagnosis not present

## 2013-10-14 LAB — PATHOLOGY REPORT

## 2013-10-23 DIAGNOSIS — R059 Cough, unspecified: Secondary | ICD-10-CM | POA: Diagnosis not present

## 2013-10-23 DIAGNOSIS — E119 Type 2 diabetes mellitus without complications: Secondary | ICD-10-CM | POA: Diagnosis not present

## 2013-10-23 DIAGNOSIS — R7401 Elevation of levels of liver transaminase levels: Secondary | ICD-10-CM | POA: Diagnosis not present

## 2013-10-23 DIAGNOSIS — R7402 Elevation of levels of lactic acid dehydrogenase (LDH): Secondary | ICD-10-CM | POA: Diagnosis not present

## 2013-10-23 DIAGNOSIS — R7989 Other specified abnormal findings of blood chemistry: Secondary | ICD-10-CM | POA: Diagnosis not present

## 2013-10-23 DIAGNOSIS — R05 Cough: Secondary | ICD-10-CM | POA: Diagnosis not present

## 2013-10-23 DIAGNOSIS — N390 Urinary tract infection, site not specified: Secondary | ICD-10-CM | POA: Diagnosis not present

## 2013-11-04 DIAGNOSIS — C61 Malignant neoplasm of prostate: Secondary | ICD-10-CM | POA: Diagnosis not present

## 2013-11-12 DIAGNOSIS — Z86010 Personal history of colonic polyps: Secondary | ICD-10-CM | POA: Diagnosis not present

## 2013-11-14 DIAGNOSIS — C61 Malignant neoplasm of prostate: Secondary | ICD-10-CM | POA: Diagnosis not present

## 2013-11-17 ENCOUNTER — Ambulatory Visit: Payer: Self-pay | Admitting: Gastroenterology

## 2013-11-17 DIAGNOSIS — D128 Benign neoplasm of rectum: Secondary | ICD-10-CM | POA: Diagnosis not present

## 2013-11-17 DIAGNOSIS — Z8546 Personal history of malignant neoplasm of prostate: Secondary | ICD-10-CM | POA: Diagnosis not present

## 2013-11-17 DIAGNOSIS — Z79899 Other long term (current) drug therapy: Secondary | ICD-10-CM | POA: Diagnosis not present

## 2013-11-17 DIAGNOSIS — D126 Benign neoplasm of colon, unspecified: Secondary | ICD-10-CM | POA: Diagnosis not present

## 2013-11-17 DIAGNOSIS — E669 Obesity, unspecified: Secondary | ICD-10-CM | POA: Diagnosis not present

## 2013-11-17 DIAGNOSIS — Z860100 Personal history of colon polyps, unspecified: Secondary | ICD-10-CM | POA: Diagnosis not present

## 2013-11-17 DIAGNOSIS — K5730 Diverticulosis of large intestine without perforation or abscess without bleeding: Secondary | ICD-10-CM | POA: Diagnosis not present

## 2013-11-17 DIAGNOSIS — E119 Type 2 diabetes mellitus without complications: Secondary | ICD-10-CM | POA: Diagnosis not present

## 2013-11-17 DIAGNOSIS — Z96659 Presence of unspecified artificial knee joint: Secondary | ICD-10-CM | POA: Diagnosis not present

## 2013-11-17 DIAGNOSIS — Z800 Family history of malignant neoplasm of digestive organs: Secondary | ICD-10-CM | POA: Diagnosis not present

## 2013-11-17 DIAGNOSIS — D129 Benign neoplasm of anus and anal canal: Secondary | ICD-10-CM | POA: Diagnosis not present

## 2013-11-17 LAB — HM COLONOSCOPY

## 2013-11-18 DIAGNOSIS — C61 Malignant neoplasm of prostate: Secondary | ICD-10-CM | POA: Diagnosis not present

## 2013-11-19 DIAGNOSIS — C61 Malignant neoplasm of prostate: Secondary | ICD-10-CM | POA: Diagnosis not present

## 2013-11-19 LAB — PATHOLOGY REPORT

## 2013-11-20 DIAGNOSIS — C61 Malignant neoplasm of prostate: Secondary | ICD-10-CM | POA: Diagnosis not present

## 2013-11-21 DIAGNOSIS — C61 Malignant neoplasm of prostate: Secondary | ICD-10-CM | POA: Diagnosis not present

## 2013-11-24 DIAGNOSIS — C61 Malignant neoplasm of prostate: Secondary | ICD-10-CM | POA: Diagnosis not present

## 2013-11-25 DIAGNOSIS — C61 Malignant neoplasm of prostate: Secondary | ICD-10-CM | POA: Diagnosis not present

## 2013-11-26 DIAGNOSIS — C61 Malignant neoplasm of prostate: Secondary | ICD-10-CM | POA: Diagnosis not present

## 2013-11-27 DIAGNOSIS — C61 Malignant neoplasm of prostate: Secondary | ICD-10-CM | POA: Diagnosis not present

## 2013-11-28 DIAGNOSIS — C61 Malignant neoplasm of prostate: Secondary | ICD-10-CM | POA: Diagnosis not present

## 2013-12-01 DIAGNOSIS — C61 Malignant neoplasm of prostate: Secondary | ICD-10-CM | POA: Diagnosis not present

## 2013-12-02 DIAGNOSIS — C61 Malignant neoplasm of prostate: Secondary | ICD-10-CM | POA: Diagnosis not present

## 2013-12-03 DIAGNOSIS — C61 Malignant neoplasm of prostate: Secondary | ICD-10-CM | POA: Diagnosis not present

## 2013-12-05 DIAGNOSIS — C61 Malignant neoplasm of prostate: Secondary | ICD-10-CM | POA: Diagnosis not present

## 2013-12-08 DIAGNOSIS — C61 Malignant neoplasm of prostate: Secondary | ICD-10-CM | POA: Diagnosis not present

## 2013-12-09 DIAGNOSIS — C61 Malignant neoplasm of prostate: Secondary | ICD-10-CM | POA: Diagnosis not present

## 2013-12-10 DIAGNOSIS — C61 Malignant neoplasm of prostate: Secondary | ICD-10-CM | POA: Diagnosis not present

## 2013-12-11 DIAGNOSIS — C61 Malignant neoplasm of prostate: Secondary | ICD-10-CM | POA: Diagnosis not present

## 2013-12-12 DIAGNOSIS — C61 Malignant neoplasm of prostate: Secondary | ICD-10-CM | POA: Diagnosis not present

## 2013-12-15 DIAGNOSIS — C61 Malignant neoplasm of prostate: Secondary | ICD-10-CM | POA: Diagnosis not present

## 2013-12-16 DIAGNOSIS — C61 Malignant neoplasm of prostate: Secondary | ICD-10-CM | POA: Diagnosis not present

## 2013-12-17 DIAGNOSIS — C61 Malignant neoplasm of prostate: Secondary | ICD-10-CM | POA: Diagnosis not present

## 2013-12-18 DIAGNOSIS — C61 Malignant neoplasm of prostate: Secondary | ICD-10-CM | POA: Diagnosis not present

## 2013-12-19 DIAGNOSIS — C61 Malignant neoplasm of prostate: Secondary | ICD-10-CM | POA: Diagnosis not present

## 2013-12-22 DIAGNOSIS — C61 Malignant neoplasm of prostate: Secondary | ICD-10-CM | POA: Diagnosis not present

## 2013-12-23 DIAGNOSIS — C61 Malignant neoplasm of prostate: Secondary | ICD-10-CM | POA: Diagnosis not present

## 2013-12-24 DIAGNOSIS — C61 Malignant neoplasm of prostate: Secondary | ICD-10-CM | POA: Diagnosis not present

## 2013-12-25 DIAGNOSIS — C61 Malignant neoplasm of prostate: Secondary | ICD-10-CM | POA: Diagnosis not present

## 2013-12-26 DIAGNOSIS — C61 Malignant neoplasm of prostate: Secondary | ICD-10-CM | POA: Diagnosis not present

## 2013-12-29 DIAGNOSIS — C61 Malignant neoplasm of prostate: Secondary | ICD-10-CM | POA: Diagnosis not present

## 2013-12-30 DIAGNOSIS — C61 Malignant neoplasm of prostate: Secondary | ICD-10-CM | POA: Diagnosis not present

## 2013-12-31 DIAGNOSIS — C61 Malignant neoplasm of prostate: Secondary | ICD-10-CM | POA: Diagnosis not present

## 2014-01-01 DIAGNOSIS — C61 Malignant neoplasm of prostate: Secondary | ICD-10-CM | POA: Diagnosis not present

## 2014-01-02 DIAGNOSIS — C61 Malignant neoplasm of prostate: Secondary | ICD-10-CM | POA: Diagnosis not present

## 2014-01-05 DIAGNOSIS — C61 Malignant neoplasm of prostate: Secondary | ICD-10-CM | POA: Diagnosis not present

## 2014-01-06 DIAGNOSIS — C61 Malignant neoplasm of prostate: Secondary | ICD-10-CM | POA: Diagnosis not present

## 2014-01-08 DIAGNOSIS — C61 Malignant neoplasm of prostate: Secondary | ICD-10-CM | POA: Diagnosis not present

## 2014-01-12 DIAGNOSIS — C61 Malignant neoplasm of prostate: Secondary | ICD-10-CM | POA: Diagnosis not present

## 2014-01-13 DIAGNOSIS — C61 Malignant neoplasm of prostate: Secondary | ICD-10-CM | POA: Diagnosis not present

## 2014-01-14 DIAGNOSIS — C61 Malignant neoplasm of prostate: Secondary | ICD-10-CM | POA: Diagnosis not present

## 2014-01-19 DIAGNOSIS — C61 Malignant neoplasm of prostate: Secondary | ICD-10-CM | POA: Diagnosis not present

## 2014-03-19 DIAGNOSIS — M7660 Achilles tendinitis, unspecified leg: Secondary | ICD-10-CM | POA: Diagnosis not present

## 2014-03-20 DIAGNOSIS — M7660 Achilles tendinitis, unspecified leg: Secondary | ICD-10-CM | POA: Diagnosis not present

## 2014-03-20 DIAGNOSIS — M7730 Calcaneal spur, unspecified foot: Secondary | ICD-10-CM | POA: Diagnosis not present

## 2014-04-28 DIAGNOSIS — C61 Malignant neoplasm of prostate: Secondary | ICD-10-CM | POA: Diagnosis not present

## 2014-05-13 DIAGNOSIS — M779 Enthesopathy, unspecified: Secondary | ICD-10-CM | POA: Diagnosis not present

## 2014-07-21 DIAGNOSIS — Z79899 Other long term (current) drug therapy: Secondary | ICD-10-CM | POA: Diagnosis not present

## 2014-07-21 DIAGNOSIS — E1122 Type 2 diabetes mellitus with diabetic chronic kidney disease: Secondary | ICD-10-CM | POA: Diagnosis not present

## 2014-07-21 LAB — LIPID PANEL
Cholesterol: 185 mg/dL (ref 0–200)
HDL: 40 mg/dL (ref 35–70)
LDL CALC: 104 mg/dL
TRIGLYCERIDES: 204 mg/dL — AB (ref 40–160)

## 2014-07-21 LAB — HEPATIC FUNCTION PANEL
ALT: 100 U/L — AB (ref 10–40)
AST: 56 U/L — AB (ref 14–40)

## 2014-08-13 DIAGNOSIS — M65312 Trigger thumb, left thumb: Secondary | ICD-10-CM | POA: Diagnosis not present

## 2014-08-13 DIAGNOSIS — M79645 Pain in left finger(s): Secondary | ICD-10-CM | POA: Diagnosis not present

## 2014-09-01 DIAGNOSIS — C61 Malignant neoplasm of prostate: Secondary | ICD-10-CM | POA: Diagnosis not present

## 2014-09-15 DIAGNOSIS — L02821 Furuncle of head [any part, except face]: Secondary | ICD-10-CM | POA: Diagnosis not present

## 2014-09-15 DIAGNOSIS — L821 Other seborrheic keratosis: Secondary | ICD-10-CM | POA: Diagnosis not present

## 2014-09-15 DIAGNOSIS — D2371 Other benign neoplasm of skin of right lower limb, including hip: Secondary | ICD-10-CM | POA: Diagnosis not present

## 2014-09-15 DIAGNOSIS — L570 Actinic keratosis: Secondary | ICD-10-CM | POA: Diagnosis not present

## 2014-09-15 DIAGNOSIS — L738 Other specified follicular disorders: Secondary | ICD-10-CM | POA: Diagnosis not present

## 2014-09-18 ENCOUNTER — Ambulatory Visit: Payer: PRIVATE HEALTH INSURANCE | Admitting: Internal Medicine

## 2014-09-21 DIAGNOSIS — E139 Other specified diabetes mellitus without complications: Secondary | ICD-10-CM | POA: Diagnosis not present

## 2014-10-05 LAB — HM DIABETES EYE EXAM

## 2014-12-01 DIAGNOSIS — C61 Malignant neoplasm of prostate: Secondary | ICD-10-CM | POA: Diagnosis not present

## 2014-12-22 NOTE — Consult Note (Signed)
Brief Consult Note: Diagnosis: tachycardia likely related to ETOH withdrawal.   Patient was seen by consultant.   Consult note dictated.   Orders entered.   Comments: 74 yo male with h/o DM and etoh abituation, who comes for TKR now on POD#2 with tachycardia up to 128. pt has significant h/o etoh 3 scotch glases ( shots ) a night. he has exactly the same problem happening a year ago after his R TKR. ciwa protocol. ekg : sinus tachy.  Electronic Signatures: James Ivanoff, Roselie Awkward (MD)  (Signed 25-Dec-13 22:10)  Authored: Brief Consult Note   Last Updated: 25-Dec-13 22:10 by Corinth Sink (MD)

## 2014-12-22 NOTE — Consult Note (Signed)
PATIENT NAMEEliodoro, [name] MR#:  [id number] DATE OF BIRTH:  [birth year]  DATE OF CONSULTATION:  08/28/2012  REFERRING PHYSICIAN:  Darrick Penna, MD CONSULTING PHYSICIAN:  [place] Sink, MD  PRIMARY ORTHOPEDIC PHYSICIAN:  Hessie Knows, MD.   REASON FOR CONSULTATION: Tachycardia.   REASON FOR HOSPITALIZATION: Elective total knee replacement.   HISTORY OF PRESENT ILLNESS: The patient is a very nice 74 year old gentleman who has a history of significant osteoarthritis of the knees. He has also a history of diabetes which has been well controlled. He takes at home aspirin and metformin, and overall, he has been healthy. He has a history of alcohol habituation, drinking about 3 glasses of scotch at night. He underwent a total knee replacement of the right knee about a year ago, and he states that at that moment his only complication was going into detox and he feels like things are going similar right now.   I was asked to see the patient due to significant and sustained tachycardia with a heart rate which has been going around 100s to 128. His blood pressure has remained elevated during the whole hospitalization, likely due to pain but also due to withdrawal. The patient had borderline low oxygen saturations with lowest oxygen saturation of 90 today at 1400 and right now is staying around 92. The patient does not have any significant shortness of breath or significant chest pain. He is on Lovenox at 30 mg every 12 hours subcutaneously.   When I evaluated the patient, the patient stated that he has been feeling a little bit jittery, a little bit anxious. I have spoken with the nurse taking care of the patient. She tells me that he has been occasionally slightly confused. He is at this moment alert, oriented x3 but he is a little bit tremulous.   The patient underwent his surgery on Monday and has not had any significant complications other than this.   REVIEW OF SYSTEMS: A 12 system  review of systems was done.  GENERAL: The patient denies any weight loss, weight gain, increased pain. Actually, he says  the pain is better controlled this time than it was with the first surgery.  EYES: Denies any problems with his eyes like changes in his visual fields. No blurry vision. No double vision.  EAR, NOSE AND THROAT: No difficulty swallowing. No rhinorrhea. The patient is not wearing any oxygen right now despite the fact that his oxygen saturation has been borderline in the 90s to 92.  RESPIRATORY: Denies any cough or hemoptysis. He denies any significant shortness of breath, although on physical exam he has atelectasis audible.   CARDIOVASCULAR: No chest pain. No orthopnea. No paroxysmal nocturnal dyspnea.  GASTROINTESTINAL: No melena. No hematemesis. He has not had a bowel movement since prior to his surgery. He is feeling that he will have one either today or tomorrow. No GI upset.  GENITOURINARY: No dysuria. No hematuria. No significant changes in frequency. No prostatitis.  ENDOCRINOLOGY: No polyuria, polydipsia, polyphagia. He has diabetes. His blood sugars are significantly elevated in the 190s to 200s. No thyroid problems, cold or heat intolerance.  MUSCULOSKELETAL: Positive pain in the left lower extremity at the level of the knee after surgery. Positive mild swelling.  PSYCHIATRIC: No depression or anxiety. Positive alcohol habituation. The patient feels a little bit anxious today but not as baseline.   NEUROLOGIC: Positive tremors, likely due to withdraw. Negative CVAs and TIAs.   A 12 system review of systems is  done and all negative except for mentioned above.   PAST MEDICAL HISTORY:  1. Diabetes type 2, noninsulin dependent.  2. Osteoarthritis.  3. Alcohol habituation.    PAST SURGICAL HISTORY:  1. Positive for right total knee replacement 1 year ago.  2. Appendectomy in 1992.   FAMILY HISTORY: Positive for colon cancer in both father and mother and positive  diabetes in his mom. No coronary artery disease.   SOCIAL HISTORY: The patient is married, lives with his wife. He denies any tobacco use at this moment. He drinks about 3 scotch shots at night, and he has been doing that for many years. He is retired.   CURRENT MEDICATIONS AT HOME: Metformin 1000 mg twice daily, aspirin 325 mg once daily, multivitamins with iron once a day.   During the hospitalization, the patient also is taking docusate with senna, metoclopramide, multivitamins, Protonix 40 mg twice a day, Lovenox 30 mg subcutaneously twice daily, insulin sliding scale and metformin 1000 mg once a day.   PHYSICAL EXAMINATION:  VITAL SIGNS: Blood pressure 147/78, respiratory 20 to 22, oxygen saturation between 90 and 92 on room air, pulse of 128 and a temperature of 99.4 as T-max.  GENERAL: The patient is alert. He is oriented x3. There is no significant acute distress, although the patient is a little bit tremulous, slightly anxious but hemodynamically stable.  HEENT: Pupils are equal and reactive. Extraocular movements are intact. Mucosa is moist. Anicteric sclerae. Pink conjunctivae. No oral lesions. No oropharyngeal exudates.  NECK: Supple. No JVD. No thyromegaly. No adenopathy. Trachea is central. No carotid bruits. No rigidity.  CARDIOVASCULAR: Regular rate and rhythm, tachycardic. Negative S3 or S4. No displacement of PMI. No tenderness to palpation of anterior chest wall.  LUNGS: Showing some bibasilar crackles which are likely due to atelectasis. Good air expansion. Oxygen saturation about 92% on room air. No use of accessory muscles.  ABDOMEN: Soft, nontender, nondistended. No hepatosplenomegaly. No masses. Bowel sounds are positive. Small periumbilical hernia, not incarcerated. No guarding.  GENITALS: Negative for external lesions.  EXTREMITIES: Positive for mild pitting edema of the left lower extremity due to postsurgical changes and compression from bandages and stockings. Incision  is covered. Dressing is clean. Pulses +2. Capillary refill less than 3 distally. Strength seems to be appropriate in all 4 extremities, but a little bit difficult to assess in the left lower extremity. Bevelyn Buckles is indeterminate. The patient has some soreness of the calf muscles but not necessarily exquisite tenderness.  NEUROLOGIC: Cranial nerves II through XII are intact. No focal findings.  PSYCHIATRIC: Mood is a little bit anxious with tremor but alert, oriented x3.  LYMPHATIC: Negative for lymphadenopathy in neck or supraclavicular areas.  SKIN: Without any significant rashes or petechiae.   RESULTS: Glucose running in the 180s to 240s, sodium 134, potassium 3.8, chloride 103, calcium 8.3. Hemoglobin is 13.6, platelets 181.   EKG: Sinus tachycardia.   ASSESSMENT AND PLAN: A 74 year old gentleman status post left total knee replacement, has history of diabetes, has history of alcoholic habituation. Consulted to evaluate the patient due to tachycardia. The patient likely going into detoxification.  1. Postoperative day #2 of total knee replacement of the left knee. The patient recovering very well. Pain well controlled. No complications.  2. Tachycardia: The patient has sinus tachycardia which is likely a sign of beginning of detoxification. We are going to start him on the full [facility] Withdrawal Assessment (CIWA) protocol with Ativan, thiamine, folic acid and multivitamins. We are going  to add on Haldol in case the patient gets really agitated. At this moment, I think his tachycardia is due to his this reason, but since the patient has risk for deep vein thrombosis due to previous surgery immobilization and he has tachycardia with decreased oxygen saturations despite the fact that he has not had shortness of breath or chest pain, I will have to proceed to rule out the possibility of having a pulmonary embolism. We are going to get a CT scan of the chest with intravenous contrast stat. He is  on Lovenox twice a day which should protect him. He also has some crackles in the bases which is likely due to atelectasis, but since pulmonary embolism would be a life-threatening condition, I prefer to rule it out.  2. Diabetes seems to be uncontrolled. We are going to add on some premeal insulin and adjust his sliding scale.  3. Alcohol habituation: CIWA protocol.  4. Low sodium, likely due to intravenous fluids. At this moment, the patient is mostly receiving thiamine, folic acid and magnesium by CIWA protocol.   5. Other medical problems seem to be stable.   CODE STATUS: The patient is a FULL CODE.   TIME SPENT: I spent about 45 minutes with this consult.      ____________________________ [place] Sink, MD rsg:gb D: 08/28/2012 22:26:00 ET T: 08/29/2012 03:58:15 ET JOB#: [id number]  cc: [place] Sink, MD, <Dictator> Tamalyn Wadsworth America Brown MD ELECTRONICALLY SIGNED 09/03/2012 10:55

## 2014-12-22 NOTE — Op Note (Signed)
PATIENT NAMEAmadu Allen, Thomas Allen MR#:  [id number] DATE OF BIRTH:  [birth year]  DATE OF PROCEDURE:  08/26/2012  PREOPERATIVE DIAGNOSIS:  Degenerative arthrosis of the left knee.   POSTOPERATIVE DIAGNOSIS:  Degenerative arthrosis of the left knee.   PROCEDURE PERFORMED:  Left total knee arthroplasty using computer-assisted navigation.   SURGEON: Laurice Record. Hooten, M.D.   ASSISTANT: Vance Peper, PA-C (required to maintain retraction throughout the procedure)   ANESTHESIA:  Femoral nerve block and spinal.   ESTIMATED BLOOD LOSS:  50 mL.   FLUIDS REPLACED:  1700 mL of crystalloid.   TOURNIQUET TIME: 113 minutes.   DRAINS: Two medium drains to reinfusion system.   SOFT TISSUE RELEASES:  Anterior cruciate ligament, posterior cruciate ligament, superficial and deep medial collateral ligament, patellofemoral ligament.  IMPLANTS UTILIZED: DePuy PFC Sigma size 4N (narrow) posterior stabilized femoral component (cemented), size 3 MBT tibial component (cemented), 35 mm 3-peg oval dome patella (cemented), and a 10 mm stabilized rotating platform polyethylene insert.   INDICATIONS FOR SURGERY:  The patient is a 74 year old gentleman who has been seen for complaints of progressive left knee pain. X-rays demonstrated severe degenerative changes in a tricompartmental fashion with relative varus deformity. He also had developed significant flexion contracture. He had not seen any significant improvement despite conservative nonsurgical intervention. After discussion of the risks and benefits of surgical intervention, the patient expressed understanding of the risks and benefits and agreed with plans for surgical intervention.   PROCEDURE IN DETAIL: The patient was brought into the operating room, after adequate femoral nerve block and spinal anesthesia was achieved, a tourniquet was placed on the patient's upper left thigh. The patient's left knee and leg were cleaned and prepped with alcohol and DuraPrep draped in  the usual sterile fashion. A "timeout" was performed as per usual protocol. The left lower extremity was exsanguinated using an Esmarch, tourniquet was inflated to 300 mmHg.  An anterior longitudinal incision was made followed by a standard mid vastus approach. A large effusion was evacuated. The deep fibers of the medial collateral ligament were elevated in a subperiosteal fashion off the medial flare of the tibia so as to maintain a continuous soft tissue sleeve. The patella was subluxed laterally and the patellofemoral ligament was incised. Inspection of the knee demonstrated severe degenerative changes in a tricompartmental fashion with full thickness loss of articular cartilage to the medial compartment. Prominent osteophytes were debrided using a rongeur. Anterior and posterior cruciate ligaments were excised. Two 4.0 mm Schanz pins were inserted into the femur and into the tibia for attachment of the trackers  that were used for computer-assisted navigation. Hip center was identified using a circumduction technique.  Distal landmarks were mapped using the computer. Distal femur and proximal tibia were mapped using the computer. Distal femoral cutting guide was positioned using computer-assisted navigation so as to achieve a 5 degree distal valgus cut. Cut was performed and verified using the computer. Despite verification by the computer, there was some concern that insufficient amount of distal femur had been resected. However, the case was continued with placement of sizing guide for the distal femur, which measured at the size 4N (narrow) femoral component. The femoral cutting guide was positioned and the anterior cut was performed and verified using the computer. This was followed by completion of the posterior and chamfer cuts. Femoral cutting guide for central box was then positioned and the central box cut was performed.  Attention was then directed to the proximal tibia. Medial and lateral  menisci  were excised. The extramedullary tibial cutting guide was positioned using computer-assisted navigation so as to achieve a 0-degree varus valgus alignment and 0-degree posterior slope.  The cut was performed and verified using the computer. The proximal tibia was sized and it was felt that a size 3 tibial tray was appropriate. Tibial and femoral trials were inserted followed by insertion of a 10 mm polyethylene insert. Good balancing was noted in flexion; however, the knee was felt to be tight medially and it was difficult to achieve full extension. A Cobb elevator was used to elevate the superficial fibers of the medial collateral ligament. A curved osteotome was used to remove prominent posterior osteophytes off of the femoral condyles. Despite these measures, the knee was still felt to be tight in extension.  The femoral trial was removed. The cutting guide was reapplied and an additional 2 mm of bone was resected from the distal femur. This was confirmed using the computer. The cutting guide was placed for recutting the chamfer cuts and verifying appropriate position with both the posterior and anterior cuts. The cutting guide for central box was then repositioned and additional 2 mm of bone was resected from the box  proximally. Femoral and tibial trials were reinserted followed by insertion of a 10 mm polyethylene trial. Full extension was noted. Excellent mediolateral soft tissue balancing was appreciated. The patella was then cut and prepared so as to accommodate a 35 mm 3-peg oval dome patella. Patellar trial was placed and the knee was placed through a range of motion with excellent patellar tracking appreciated.   Femoral trial was removed. Central post hole for the tibial component was reamed followed by insertion of a keel punch. Tibial trial was then removed. Cut surfaces of bone were irrigated with copious amounts of normal saline with antibiotic solution using pulsatile lavage and then suctioned  dry. Polymethyl methacrylate cement with gentamicin was mixed in the usual fashion using a vacuum mixer. Cement was applied to the cut surface of the proximal tibia as well as along the undersurface of a size 3 MBT tibial component. The tibial component was positioned and impacted into place. Excess cement was removed using freer elevators. Cement was then applied to the cut surface of the femur as well as on the posterior flanges of a size 4N (narrow) posterior stabilized femoral component. Femoral component was positioned and impacted into place. Excess cement was removed using freer elevators. A 10 mm polyethylene trial was inserted and the knee was brought in full extension with steady axial compression applied. Finally, cement was applied to the backside of a 35 mm 3-peg oval dome patella and the patellar component was positioned and patellar clamp applied. Excess cement was removed using freer elevators.   After adequate curing of cement, the tourniquet was deflated after total tourniquet time of 113 minutes. Hemostasis was achieved using electrocautery. The knee was irrigated with copious amounts of normal saline with antibiotic solution using pulsatile lavage and then suctioned dry. The knee was inspected for any residual cement debris and 30 mL of 0.25% Marcaine with epinephrine was injected along the posterior capsule. A 10 mm stabilized rotating platform polyethylene insert was inserted and the knee was placed through a range of motion. Excellent patellar tracking was appreciated and excellent mediolateral soft tissue balancing was appreciated both in full extension and in flexion.  Two medium drains were placed in the wound bed and brought out through a separate stab incision to be attached to a reinfusion  system. The medial parapatellar portion of the incision was reapproximated using interrupted sutures of #1 Vicryl. The subcutaneous tissue was approximated in layers using first #0 Vicryl followed by  #2-0 Vicryl. Skin was closed with skin staples. A sterile dressing was applied.   The patient tolerated the procedure well. He was transported to the recovery room in stable condition.    ____________________________ Laurice Record. Holley Bouche., MD jph:ct D: 08/26/2012 19:47:54 ET T: 08/27/2012 09:42:32 ET JOB#: [id number]  cc: Laurice Record. Holley Bouche., MD, <Dictator> JAMES P Holley Bouche MD ELECTRONICALLY SIGNED 09/01/2012 20:35

## 2014-12-22 NOTE — Discharge Summary (Signed)
PATIENT NAMEDeionte Allen, Thomas Allen MR#:  [id number] DATE OF BIRTH:  05/08/1941  DATE OF ADMISSION:  08/26/2012 DATE OF DISCHARGE:  08/29/2012  ADMITTING DIAGNOSIS: Degenerative arthrosis of the left knee.   DISCHARGE DIAGNOSES:  1. Degenerative arthrosis of the knee. 2. Sinus tachycardia secondary to EtOH withdrawal.   CONSULTANT: [place]en[place] Sink, MD - Hospitalist.   HISTORY: The patient is a 74 year old who has been followed at [facility] for progression of left knee pain. The patient has had a long history of left knee pain that he states probably originated from injuries he sustained while wrestling in college. He had reported some medial left knee pain with weight-bearing activities. He denied any significant swelling of the knee. He was having difficulty with ascending and descending stairs. On occasion he was noted to have some night pain. He had been exercising on a regular basis, but due to the severity of pain he had to curtail his activities of weight training as well as swimming. The patient at the time of surgery was not using any ambulatory aid. He was limited with the use of antiinflammatories secondary to the potential renal effects. The left knee pain had increased to the point that it was significantly interfering with his activities of daily living. X-rays taken in [facility] showed narrowing of the medial cartilage space with associated varus alignment. Osteophyte as well as subchondral sclerosis was noted. After discussion of the risks and benefits of surgical intervention, the patient expressed his understanding of the risks and benefits and agreed with plans for surgical intervention.   PROCEDURE: Left total knee arthroplasty using computer-assisted navigation.   ANESTHESIA: Femoral nerve block with spinal.   SOFT TISSUE RELEASE: Anterior cruciate ligament, posterior cruciate ligament and superficial and deep medial collateral ligaments as well as patellofemoral  ligament.   IMPLANTS UTILIZED: DePuy PFC Sigma size 4 narrow posterior stabilized femoral component (cemented), size 3 MBT tibial component (cemented), 35 mm three pegged oval dome patella (cemented) and a 10 mm stabilized rotating platform polyethylene insert.   HOSPITAL COURSE: The patient tolerated the procedure very well. He had no complications. He was then taken to the PAC-U where he was stabilized and then transferred to the orthopedic floor. The patient began receiving anticoagulation therapy of Lovenox 30 mg subcutaneous every 12 hours per anesthesia protocol. He was fitted with TED stockings bilaterally. These were allowed to be removed 1 hour per 8 hour shift. The left one was applied on day 2 following removal of the Hemovac and dressing change. The patient was also fitted with the AV-I compression foot pumps bilaterally set at 80 mmHg. There has been no evidence of any DVTs of the lower extremities. Calves have been nontender. Negative Homans sign. His heels were elevated off the bed using rolled towels.   The patient has denied any chest pain or any shortness of breath. Vital signs have been stable, except for run of sinus tachycardia that occurred on day 2. He has been afebrile. Due to this sinus tachycardia, a medical consult was obtained by the hospitalist, Dr. Laurin Coder. After evaluation this was felt to be secondary to EtOH abuse. He was having no chest pain or any shortness of breath. The patient did appear to go into slight alcohol withdrawal and subsequently developed a little bit of tremors and was placed on CIWA protocol. This seemed to help him. After some discussion with the patient, the patient takes 3 jiggers per night before going to bed. Once the CIWA  protocol was initiated the patient has done very well. He had no other complications.   Physical therapy was initiated on day 1 gait training and transfers. He has done very well. Upon being discharged he was ambulating greater than  200 feet. He was able go up and down four sets of steps. He  was independent with bed to chair transfers. Occupational therapy was also initiated on day 1 for ADLs and assistive devices. The patient has progressed very nicely and has had no complications.   The patient's IV, Foley and Hemovac were discontinued on day 2 along with a dressing change. The wound was free of any drainage or signs of infection. The Polar Care was reapplied to the surgical leg maintaining a temperature of 40 to 50 degrees Fahrenheit.   The patient is being discharged to home in improved stable condition. He may weight bear as tolerated. He is to continue using a walker until cleared by physical therapy to go to a quad cane. He will receive home health PT. He is to continue wearing his TED stockings. These are to be worn during the day but may be removed at night. Recommend that he uses Polar Care around-the-clock as much as he can for the first 2 weeks. He is to maintain a temperature of 40 to 50 degrees Fahrenheit. He is placed on an ADA diet. He has a follow-up appointment with [facility] on January 7th at 8:45 with Vance Peper, PA. He is to call the clinic sooner if any temperatures of 101.5 or greater or any problems. He was instructed on wound care. He is not to get the wound wet until staples are removed in 2 weeks. At 2 weeks, he will restart outpatient physical therapy. He is to resume his regular medications that he was on prior to admission. He was given a prescription for Lovenox 40 mg subcutaneously daily for 14 days and then discontinue and begin taking one 81 mg enteric-coated aspirin. He was given a prescription for Ultram 50 to 100 mg every 4 to 6 hours p.r.Allen. for pain and Roxicodone 5 to 10 mg every 4 to 6 hours p.r.Allen. for pain.   PAST MEDICAL HISTORY: 1. Chickenpox.  2. Diabetes. 3. EtOH abuse.  ____________________________ Vance Peper, PA jrw:[id number] D: 08/30/2012 08:01:31 ET T: 08/30/2012 13:25:57  ET JOB#: [id number]  cc: Vance Peper, PA, <Dictator> Esco Joslyn PA ELECTRONICALLY SIGNED 09/02/2012 18:21

## 2014-12-25 NOTE — H&P (Signed)
PATIENT NAMELeomar Allen, Woodruff N MR#:  [id number] DATE OF BIRTH:  Feb 28, 1941  DATE OF ADMISSION:  08/22/2013  PRIMARY CARE PHYSICIAN: Dr. Army Melia.  The patient is a 74 year old Caucasian male with past medical history significant for history of diabetes mellitus, osteoarthritis, history of alcohol abuse, who presented to the hospital with complaints of fevers and chills. According to the patient, he was doing well up until yesterday when he said he had violent shivers and chills. He underwent a prostate biopsy on Tuesday, 3 days ago. He was given ampicillin before and after procedure; however, yesterday, he started having fatigue and weakness, as well as increasing fevers and chills. His temperature went as high as 101.7. He decided to come to the emergency room for further evaluation. He is also complaining of pain in penile shaft with urination since yesterday, which is described as a burning sensation. In the Emergency Room, he was noted to have pyuria with white blood cell count of approximately 106, and hospitalist services were contacted for admission.   PAST MEDICAL HISTORY: Significant for history of diabetes mellitus, on oral medications; osteoarthritis, alcohol abuse, history of bilateral knee replacements due to injury during college years. He had alcohol withdrawal symptoms in December 2013, when he was admitted for degenerative arthrosis of the knee, left total knee arthroplasty.   MEDICATIONS: Metformin 1 gram twice daily, as well as multivitamins.   ALLERGIES: No known drug allergies.   FAMILY HISTORY: Colon cancer in the patient's father, as well as mother, and diabetes mellitus in the patient's mother. No early coronary artery disease.   SOCIAL HISTORY: The patient is married, lives with his wife. No tobacco use at present; however, he used to smoke for at least 20 years, more than 1 pack, sometimes up to 2 packs a day. Quit approximately in 1980s. Drinks at least 1 scotch bottle a week,  and he has been doing this for many years. He denies any withdrawal symptoms at home. He is retired.   REVIEW OF SYSTEMS:  CONSTITUTIONAL:  Positive for fevers, chills, as well as weakness and dysuria. Otherwise, denies any weight loss or gain.  EYES:  In regards to eyes, denies blurry vision, double vision, glaucoma or cataracts.  EARS, NOSE, THROAT: Denies any tinnitus, allergies, epistaxis, sinus pain, dentures or difficulty swallowing. RESPIRATORY: Denies any wheezes, asthma, COPD.  CARDIOVASCULAR: Denies chest pains, orthopnea, edema, arrhythmias, palpitations or syncope.  GASTROINTESTINAL:  Denies any nausea, vomiting, diarrhea or constipation.  GENITOURINARY: Denies any hematuria, increased frequency or incontinence.  ENDOCRINOLOGY:  Denies any polydipsia,, nocturia, thyroid problems, heat or cold intolerance.  HEMATOLOGIC AND LYMPHATICS:  Denies any anemia, easy bruising, bleeding or swollen glands. SKIN: Denies any acne, rashes or change in moles.  MUSCULOSKELETAL: Denies arthritis, cramps, swelling or gout.  NEUROLOGIC: No numbness, weakness, epilepsy.  PSYCHIATRY: Denies anxiety, insomnia or depression.   PHYSICAL EXAMINATION: VITAL SIGNS: On arrival to the hospital, temperature was 99.4. Pulse 104, respirations 22,  blood pressure 135/66, saturation was 95% on room air.  GENERAL: This is well-developed, well-nourished, obese Caucasian male in no significant distress, sitting on the stretcher.  HEENT: His pupils are equal and reactive to light.  Extraocular muscles are intact. No icterus or conjunctivitis.  Has normal hearing.  No pharyngeal erythema. Mucosa is moist.  He is wrapping himself into blankets because he is chilly, and he had some shaking chills.  NECK: No masses.  Supple, nontender. Thyroid is not enlarged. No adenopathy. No JVD, carotid bruits bilaterally.  Full range of motion. LUNGS:  Clear to auscultation with diminished breath sounds bilaterally. No wheezes, rales,  rhonchi or respiratory distress.  CARDIOVASCULAR: S1, S2 appreciated. No murmurs, gallops or rubs noted. PMI not lateralized.  Chest is nontender to palpation.  EXTREMITIES:  1+ pedal pulses. No lower extremity edema, calf tenderness or cyanosis was noted.  ABDOMEN: Soft, nontender. Bowel sounds are present. No hepatosplenomegaly or masses were noted.  RECTAL: Deferred.  MUSCULOSKELETAL: Able to move all extremities. No cyanosis, degenerative joint disease or kyphosis. Gait is not tested.  SKIN:  Denies any rashes, lesions, erythema, nodularity or induration. It was warm and dry to palpation.  LYMPHATIC: No adenopathy in the cervical region.  NEUROLOGIC: Cranial nerves grossly intact. Sensory is intact. No dysarthria or dysphasia.  PSYCHIATRIC:  The patient is alert and oriented to time, person and place, cooperative. Memory is good. No significant confusion, agitation or depression noted.   LABORATORY AND RADIOLOGICAL DATA: BMP revealed sodium 132. Phosphorus was 1.7 and magnesium was 1.3, albumin level was 3.1; otherwise, liver enzymes were unremarkable, as well as BMP. White blood cell count is elevated to 11.6. Hemoglobin was 14.1. Platelet count was 135. Coagulation panel was normal with pro time of 14.6, INR 1.1. Influenza test was negative. Urinalysis: Amber, hazy urine. Negative for glucose or bilirubin, 1+ ketones, specific gravity 1.026. PH was 5.0, 2+ blood, 100 mg/dL of protein. Positive for nitrites, 2+ leukocyte esterase, 6 red blood cells, 106 white blood cells, 2+ bacteria, 1 epithelial cell, as well as mucus was present. ABGs were done on venous blood. The pH was 7.40, pCO2 was 40, pO2 was not checked. Lactic acid level was 1.3, which was within normal limits.   ASSESSMENT AND PLAN:  Admit the patient to the medical floor. Get blood cultures as well as urine cultures taken. Would likely suspect bacteremia after prostate biopsy. We will  initiate the patient on Zosyn. We will follow the  patient's culture results.  1.  Urinary tract infection. Get cultures, continue Zosyn.  2. Diabetes mellitus.  Start the patient on sliding scale insulin, and continue the patient on metformin.  Get hemoglobin A1c. 3.  History of alcohol abuse. Watch for withdrawal.   TIME SPENT: 50 minutes.   ____________________________ Theodoro Grist, MD rv:[id number] D: 08/22/2013 19:52:49 ET T: 08/22/2013 21:16:24 ET JOB#: [id number]  cc: Theodoro Grist, MD, <Dictator> Halina Maidens, MD Noel MD ELECTRONICALLY SIGNED 08/30/2013 19:13

## 2014-12-25 NOTE — Discharge Summary (Signed)
PATIENT NAMEHavier Allen, [name] MR#:  [id number] DATE OF BIRTH:  June 08, 1941  DATE OF ADMISSION:  08/22/2013 DATE OF DISCHARGE:  08/25/2013  For a detailed note, please see the history and physical done on admission by Dr. Ether Griffins.   DIAGNOSES AT DISCHARGE: 1.  Sepsis secondary to urinary tract infection.  2.  Urinary tract infection secondary to Escherichia coli.  3.  Acute renal failure secondary to sepsis and acute tubular necrosis, now resolved.  4.  Diabetes.  5.  Hypokalemia.   DIET: The patient is being discharged on a carb-controlled diet.   ACTIVITY: As tolerated.   FOLLOW-UP: In the next 1 to 2 weeks with Dr. Halina Maidens.   DISCHARGE MEDICATIONS:  Metformin 1000 mg b.i.d., multivitamin daily and Ceftin 5 mg b.i.d. x 10 days.   PERTINENT STUDIES DONE DURING THE HOSPITAL COURSE: Are as follows: A chest x-ray done on admission showing bibasilar atelectasis. A urine culture to be positive for Escherichia coli, greater than 100,000 colonies. Blood cultures positive for Escherichia coli.   HOSPITAL COURSE: This is a 74 year old male with medical problems as mentioned above, presented to the hospital with fever, tachycardia and an abnormal urinalysis suspected of a urinary tract infection.  1.  Sepsis. The most likely cause of the patient's sepsis was urinary tract infection. The patient presented with an abnormal urinalysis with dysuria. He had fever and leukocytosis. The patient was started on aggressive IV fluids, and also started on broad-spectrum IV antibiotics with Zosyn. Blood cultures and urine cultures were obtained. The patient's blood cultures are positive for Escherichia coli and so were the urine cultures, which was likely the source of the patient's sepsis. After getting a few days of IV antibiotics, the patient has not been afebrile and hemodynamically stable over the past 48 hours. His white cell count has now normalized. He is, therefore, being discharged on oral Ceftin for  the next 10 days to finish treatment for his sepsis secondary to urinary tract infection.  2.  Urinary tract infection, likely the source of the patient's sepsis. The patient's urine cultures were positive for Escherichia coli. The patient was empirically treated in the hospital with IV Zosyn although is being discharged on oral Ceftin to finish treatment for his urinary tract infection.  3.  Acute renal failure. This was likely secondary to the sepsis and hypotension. The patient was given aggressive IV fluids and his renal function since then has improved and now normalized.  4.  Hypokalemia. The patient's potassium has been supplemented and is currently normal.  5.  Diabetes. The patient was maintained on some sliding scale insulin while in the hospital, but will  resume his metformin upon discharge.   CODE STATUS: The patient is a full code.   DISPOSITION: He is being discharged home.   TIME SPENT: 40 minutes   ____________________________ Belia Heman. Verdell Carmine, MD vjs:cc D: 08/25/2013 16:04:26 ET T: 08/25/2013 23:07:09 ET JOB#: [id number]  cc: Belia Heman. Verdell Carmine, MD, <Dictator> Halina Maidens, MD Henreitta Leber MD ELECTRONICALLY SIGNED 08/28/2013 13:42

## 2015-01-13 DIAGNOSIS — E1165 Type 2 diabetes mellitus with hyperglycemia: Secondary | ICD-10-CM | POA: Diagnosis not present

## 2015-01-13 DIAGNOSIS — N183 Chronic kidney disease, stage 3 (moderate): Secondary | ICD-10-CM | POA: Diagnosis not present

## 2015-01-13 DIAGNOSIS — N182 Chronic kidney disease, stage 2 (mild): Secondary | ICD-10-CM | POA: Diagnosis not present

## 2015-01-13 DIAGNOSIS — E1129 Type 2 diabetes mellitus with other diabetic kidney complication: Secondary | ICD-10-CM | POA: Diagnosis not present

## 2015-01-13 DIAGNOSIS — I10 Essential (primary) hypertension: Secondary | ICD-10-CM | POA: Diagnosis not present

## 2015-01-13 DIAGNOSIS — E1122 Type 2 diabetes mellitus with diabetic chronic kidney disease: Secondary | ICD-10-CM | POA: Diagnosis not present

## 2015-01-13 DIAGNOSIS — N951 Menopausal and female climacteric states: Secondary | ICD-10-CM | POA: Diagnosis not present

## 2015-01-13 DIAGNOSIS — K529 Noninfective gastroenteritis and colitis, unspecified: Secondary | ICD-10-CM | POA: Diagnosis not present

## 2015-01-13 LAB — BASIC METABOLIC PANEL
BUN: 14 mg/dL (ref 4–21)
Creatinine: 0.8 mg/dL (ref <=1.3)

## 2015-01-13 LAB — HEMOGLOBIN A1C: Hgb A1c MFr Bld: 6.7 % — AB (ref 4.0–6.0)

## 2015-03-30 ENCOUNTER — Encounter: Payer: Self-pay | Admitting: Internal Medicine

## 2015-03-30 DIAGNOSIS — I10 Essential (primary) hypertension: Secondary | ICD-10-CM | POA: Insufficient documentation

## 2015-03-30 DIAGNOSIS — N289 Disorder of kidney and ureter, unspecified: Secondary | ICD-10-CM | POA: Insufficient documentation

## 2015-03-30 DIAGNOSIS — N529 Male erectile dysfunction, unspecified: Secondary | ICD-10-CM | POA: Insufficient documentation

## 2015-03-30 DIAGNOSIS — F17201 Nicotine dependence, unspecified, in remission: Secondary | ICD-10-CM | POA: Insufficient documentation

## 2015-03-30 DIAGNOSIS — C61 Malignant neoplasm of prostate: Secondary | ICD-10-CM | POA: Insufficient documentation

## 2015-03-30 DIAGNOSIS — G5600 Carpal tunnel syndrome, unspecified upper limb: Secondary | ICD-10-CM | POA: Insufficient documentation

## 2015-03-30 DIAGNOSIS — R945 Abnormal results of liver function studies: Secondary | ICD-10-CM | POA: Insufficient documentation

## 2015-03-30 DIAGNOSIS — R030 Elevated blood-pressure reading, without diagnosis of hypertension: Secondary | ICD-10-CM | POA: Insufficient documentation

## 2015-03-30 DIAGNOSIS — F1020 Alcohol dependence, uncomplicated: Secondary | ICD-10-CM | POA: Insufficient documentation

## 2015-03-30 DIAGNOSIS — E119 Type 2 diabetes mellitus without complications: Secondary | ICD-10-CM | POA: Insufficient documentation

## 2015-03-30 DIAGNOSIS — Z86010 Personal history of colonic polyps: Secondary | ICD-10-CM | POA: Insufficient documentation

## 2015-03-30 DIAGNOSIS — R7989 Other specified abnormal findings of blood chemistry: Secondary | ICD-10-CM | POA: Insufficient documentation

## 2015-07-26 ENCOUNTER — Encounter: Payer: Self-pay | Admitting: Internal Medicine

## 2015-07-26 ENCOUNTER — Ambulatory Visit (INDEPENDENT_AMBULATORY_CARE_PROVIDER_SITE_OTHER): Payer: Medicare Other | Admitting: Internal Medicine

## 2015-07-26 VITALS — BP 130/76 | HR 84 | Ht 68.0 in | Wt 246.2 lb

## 2015-07-26 DIAGNOSIS — R030 Elevated blood-pressure reading, without diagnosis of hypertension: Secondary | ICD-10-CM

## 2015-07-26 DIAGNOSIS — Z0000 Encounter for general adult medical examination without abnormal findings: Secondary | ICD-10-CM | POA: Diagnosis not present

## 2015-07-26 DIAGNOSIS — F1020 Alcohol dependence, uncomplicated: Secondary | ICD-10-CM | POA: Diagnosis not present

## 2015-07-26 DIAGNOSIS — E119 Type 2 diabetes mellitus without complications: Secondary | ICD-10-CM | POA: Diagnosis not present

## 2015-07-26 DIAGNOSIS — F17201 Nicotine dependence, unspecified, in remission: Secondary | ICD-10-CM

## 2015-07-26 DIAGNOSIS — C61 Malignant neoplasm of prostate: Secondary | ICD-10-CM

## 2015-07-26 DIAGNOSIS — N289 Disorder of kidney and ureter, unspecified: Secondary | ICD-10-CM | POA: Diagnosis not present

## 2015-07-26 DIAGNOSIS — Z720 Tobacco use: Secondary | ICD-10-CM | POA: Diagnosis not present

## 2015-07-26 DIAGNOSIS — E118 Type 2 diabetes mellitus with unspecified complications: Secondary | ICD-10-CM | POA: Insufficient documentation

## 2015-07-26 LAB — POCT URINALYSIS DIPSTICK
Bilirubin, UA: NEGATIVE
Blood, UA: NEGATIVE
Glucose, UA: NEGATIVE
Ketones, UA: NEGATIVE
Leukocytes, UA: NEGATIVE
Nitrite, UA: NEGATIVE
Protein, UA: NEGATIVE
Spec Grav, UA: 1.02
Urobilinogen, UA: 0.2
pH, UA: 5

## 2015-07-26 NOTE — Progress Notes (Signed)
Patient: Thomas Allen, Male    DOB: Oct 24, 1940, 74 y.o.   MRN: [id number] Visit Date: 07/26/2015  Today's Provider: Halina Maidens, MD   Chief Complaint  Patient presents with  . Medicare Wellness  . Hypertension  . Diabetes   Subjective:    Annual wellness visit Thomas Allen is a 74 y.o. male who presents today for his Subsequent Annual Wellness Visit. He feels fairly well. He reports exercising walking and treadmill daily. He reports he is sleeping well.   ----------------------------------------------------------- Diabetes He presents for his follow-up diabetic visit. He has type 2 diabetes mellitus. His disease course has been stable. There are no hypoglycemic associated symptoms. Pertinent negatives for hypoglycemia include no confusion, headaches or nervousness/anxiousness. (Last A1C in September 6.8) Pertinent negatives for diabetes include no chest pain, no fatigue, no polydipsia and no polyuria. Current diabetic treatment includes oral agent (monotherapy). He is compliant with treatment all of the time. His breakfast blood glucose is taken between 7-8 am. His breakfast blood glucose range is generally 130-140 mg/dl.  Hypertension This is a chronic problem. The current episode started more than 1 year ago. The problem is unchanged. The problem is controlled. Pertinent negatives include no chest pain, headaches, neck pain, palpitations or shortness of breath. Past treatments include ACE inhibitors. The current treatment provides significant improvement. There are no compliance problems.    Prostate cancer - he is status post radiation therapy. He follows up with the radiation oncologist on a regular basis. Last PSA was 0.18 in September. He denies urinary issues such as frequency, dysuria, or blood in his urine. He does have some hot flashes during the night and a very low testosterone level which cannot be supplemented due to his history of prostate cancer.  Review of Systems   Constitutional: Negative for fever, chills and fatigue.  HENT: Negative for ear pain, hearing loss, sore throat, tinnitus, trouble swallowing and voice change.   Eyes: Negative for visual disturbance.  Respiratory: Negative for chest tightness and shortness of breath.   Cardiovascular: Negative for chest pain, palpitations and leg swelling.  Gastrointestinal: Positive for abdominal pain (reflux). Negative for diarrhea and constipation.  Endocrine: Negative for polydipsia and polyuria.  Genitourinary: Negative for dysuria and frequency.  Musculoskeletal: Positive for arthralgias (right carpal tunnel). Negative for myalgias, joint swelling, neck pain and neck stiffness.  Skin: Negative for color change and rash.  Allergic/Immunologic: Negative for environmental allergies and food allergies.  Neurological: Negative for headaches.  Psychiatric/Behavioral: Positive for sleep disturbance. Negative for confusion and dysphoric mood. The patient is not nervous/anxious.     Social History   Social History  . Marital Status: Married    Spouse Name: N/A  . Number of Children: N/A  . Years of Education: N/A   Occupational History  . Not on file.   Social History Main Topics  . Smoking status: Former Research scientist (life sciences)  . Smokeless tobacco: Not on file  . Alcohol Use: 12.0 oz/week    20 Standard drinks or equivalent per week  . Drug Use: Not on file  . Sexual Activity: Not on file   Other Topics Concern  . Not on file   Social History Narrative    Patient Active Problem List   Diagnosis Date Noted  . Continuous chronic alcoholism ([place]) 03/30/2015  . Carpal tunnel syndrome 03/30/2015  . Impaired renal function 03/30/2015  . Elevated blood pressure, situational 03/30/2015  . Abnormal LFTs 03/30/2015  . Failure of erection 03/30/2015  . H/O adenomatous  polyp of colon 03/30/2015  . CA of prostate ([place]) 03/30/2015  . Current tobacco use 03/30/2015  . Diabetes mellitus, type 2 ([place]) 03/30/2015   . Malignant neoplasm of prostate ([place]) 09/02/2013  . Sepsis due to Escherichia coli ([place]) 09/02/2013  . Elevated prostate specific antigen (PSA) 07/20/2013  . Posterior calcaneal exostosis 02/26/2013  . Calcific Achilles tendinitis 02/26/2013    Past Surgical History  Procedure Laterality Date  . Replacement total knee Bilateral   . Prostate biopsy  2012    malignant  . Colonoscopy  2008    benign polyps    His family history includes Diabetes in his mother.    Previous Medications   ASPIRIN 325 MG TABLET    Take by mouth.   LISINOPRIL (PRINIVIL,ZESTRIL) 5 MG TABLET    Take 1 tablet by mouth daily.   METFORMIN (GLUCOPHAGE) 1000 MG TABLET    Take 1 tablet by mouth 2 (two) times daily.    Patient Care Team: Glean Hess, MD as PCP - General (Internal Medicine) Lollie Sails, MD as Consulting Physician (Gastroenterology)     Objective:   Vitals: BP 130/76 mmHg  Pulse 84  Ht 5\' 8"  (1.727 m)  Wt 246 lb 3.2 oz (111.676 kg)  BMI 37.44 kg/m2  Physical Exam  Constitutional: He is oriented to person, place, and time. He appears well-developed and well-nourished. No distress.  HENT:  Head: Normocephalic and atraumatic.  Eyes: Conjunctivae are normal. Right eye exhibits no discharge. Left eye exhibits no discharge. No scleral icterus.  Neck: Normal range of motion. Neck supple. Carotid bruit is not present. No thyromegaly present.  Cardiovascular: Normal rate, regular rhythm and normal heart sounds.   Pulmonary/Chest: Effort normal and breath sounds normal. No respiratory distress.  Abdominal: Soft. Bowel sounds are normal. He exhibits no distension and no mass. There is no tenderness. There is no rebound and no guarding.  Musculoskeletal: Normal range of motion.  Lymphadenopathy:    He has no cervical adenopathy.    He has no axillary adenopathy.  Neurological: He is alert and oriented to person, place, and time.  Skin: Skin is warm and dry. No rash noted.   Psychiatric: He has a normal mood and affect. His behavior is normal. Thought content normal.  Nursing note and vitals reviewed.   Activities of Daily Living In your present state of health, do you have any difficulty performing the following activities: 07/26/2015  Hearing? N  Vision? N  Difficulty concentrating or making decisions? N  Walking or climbing stairs? N  Dressing or bathing? N  Doing errands, shopping? N    Fall Risk Assessment Fall Risk  07/26/2015  Falls in the past year? No     Patient reports there are safety devices in place in shower at home.   Depression Screen PHQ 2/9 Scores 07/26/2015  PHQ - 2 Score 0    Cognitive Testing - 6-CIT   Correct? Score   What year is it? yes 0 Yes = 0    No = 4  What month is it? yes 0 Yes = 0    No = 3  Remember:     Pia [address]au, [place], [place]     What time is it? yes 0 Yes = 0    No = 3  Count backwards from 20 to 1 yes 0 Correct = 0    1 error = 2   More than 1 error = 4  Say the  months of the year in reverse. yes 0 Correct = 0    1 error = 2   More than 1 error = 4  What address did I ask you to remember? yes 0 Correct = 0  1 error = 2    2 error = 4    3 error = 6    4 error = 8    All wrong = 10       TOTAL SCORE  0/28   Interpretation:  Normal  Normal (0-7) Abnormal (8-28)   Lab Results  Component Value Date   HGBA1C 6.7* 01/13/2015   Lab Results  Component Value Date   CREATININE 0.8 01/13/2015   Wt Readings from Last 3 Encounters:  07/26/15 246 lb 3.2 oz (111.676 kg)  01/13/15 246 lb (111.585 kg)        This SmartLink has not been configured with any valid records.    Assessment & Plan:     Annual Wellness Visit  Reviewed patient's Family Medical History Reviewed and updated list of patient's medical providers Assessment of cognitive impairment was done Assessed patient's functional ability Established a written schedule for health screening [place]  Completed and Reviewed  Exercise Activities and Dietary recommendations Goals    None       There is no immunization history on file for this patient.  Health Maintenance  Topic Date Due  . FOOT EXAM  11/22/1951  . URINE MICROALBUMIN  11/22/1951  . TETANUS/TDAP  11/21/1960  . ZOSTAVAX  11/21/2001  . PNA vac Low Risk Adult (1 of 2 - PCV13) 11/22/2006  . INFLUENZA VACCINE  04/05/2015  . HEMOGLOBIN A1C  07/16/2015  . OPHTHALMOLOGY EXAM  10/06/2015  . COLONOSCOPY  11/18/2023     Discussed health benefits of physical activity, and encouraged him to engage in regular exercise appropriate for his age and condition.    ------------------------------------------------------------------------------------------------------------ 1. Medicare annual wellness visit, subsequent Medicare wellness measure satisfied Patient declines all immunizations and vaccinations - POCT urinalysis dipstick  2. Type 2 diabetes mellitus without complication, without long-term current use of insulin (HCC) Last A1c was 6.8 2 months ago Continue oral agents and diet  3. Elevated blood pressure, situational Controlled on lisinopril  4. Impaired renal function Most recent creatinine was normal  5. Continuous chronic alcoholism ([place]) Patient continues to consume 3 shots of liquor per day Discussed reducing intake to 2 shots per day and increasing exercise  6. Tobacco use disorder, moderate, in sustained remission  7. Malignant neoplasm of prostate [facility]) Followed by radiation oncology Most recent PSA was 0.18  Halina Maidens, MD [facility]  07/26/2015

## 2015-07-26 NOTE — Patient Instructions (Signed)
Health Maintenance  Topic Date Due  . TETANUS/TDAP  11/21/1960  . ZOSTAVAX  11/21/2001  . PNA vac Low Risk Adult (1 of 2 - PCV13) 11/22/2006  . INFLUENZA VACCINE  04/05/2015  . HEMOGLOBIN A1C  07/16/2015  . OPHTHALMOLOGY EXAM  10/06/2015  . FOOT EXAM  07/25/2016  . URINE MICROALBUMIN  07/25/2016  . COLONOSCOPY  11/18/2023

## 2015-08-06 ENCOUNTER — Other Ambulatory Visit: Payer: Self-pay | Admitting: Internal Medicine

## 2015-11-17 ENCOUNTER — Other Ambulatory Visit: Payer: Self-pay | Admitting: Internal Medicine

## 2015-11-23 DIAGNOSIS — C61 Malignant neoplasm of prostate: Secondary | ICD-10-CM | POA: Diagnosis not present

## 2016-01-19 ENCOUNTER — Ambulatory Visit (INDEPENDENT_AMBULATORY_CARE_PROVIDER_SITE_OTHER): Payer: Medicare Other | Admitting: Internal Medicine

## 2016-01-19 ENCOUNTER — Encounter: Payer: Self-pay | Admitting: Internal Medicine

## 2016-01-19 VITALS — BP 117/72 | HR 97 | Temp 97.7°F | Resp 16 | Ht 68.0 in | Wt 239.0 lb

## 2016-01-19 DIAGNOSIS — G5603 Carpal tunnel syndrome, bilateral upper limbs: Secondary | ICD-10-CM

## 2016-01-19 DIAGNOSIS — R030 Elevated blood-pressure reading, without diagnosis of hypertension: Secondary | ICD-10-CM

## 2016-01-19 DIAGNOSIS — N183 Chronic kidney disease, stage 3 unspecified: Secondary | ICD-10-CM | POA: Insufficient documentation

## 2016-01-19 DIAGNOSIS — Z720 Tobacco use: Secondary | ICD-10-CM

## 2016-01-19 DIAGNOSIS — F1099 Alcohol use, unspecified with unspecified alcohol-induced disorder: Secondary | ICD-10-CM | POA: Diagnosis not present

## 2016-01-19 DIAGNOSIS — C61 Malignant neoplasm of prostate: Secondary | ICD-10-CM

## 2016-01-19 DIAGNOSIS — IMO0002 Reserved for concepts with insufficient information to code with codable children: Secondary | ICD-10-CM | POA: Insufficient documentation

## 2016-01-19 DIAGNOSIS — F17201 Nicotine dependence, unspecified, in remission: Secondary | ICD-10-CM

## 2016-01-19 DIAGNOSIS — E1122 Type 2 diabetes mellitus with diabetic chronic kidney disease: Secondary | ICD-10-CM | POA: Insufficient documentation

## 2016-01-19 MED ORDER — LOSARTAN POTASSIUM 50 MG PO TABS: 50.0000 mg | ORAL_TABLET | Freq: Every day | ORAL | Status: DC

## 2016-01-19 NOTE — Progress Notes (Signed)
Date:  01/19/2016   Name:  Thomas Allen   DOB:  May 26, 1941   MRN:  [id number]   Chief Complaint: Cough; Hypertension; and Diabetes Cough This is a chronic problem. The current episode started more than 1 year ago. The problem has been unchanged. The cough is non-productive. Pertinent negatives include no chest pain, ear pain, headaches, rash, shortness of breath or wheezing. Associated symptoms comments: Started with lisinopril therapy.  Hypertension This is a chronic problem. The current episode started more than 1 year ago. The problem is unchanged. The problem is controlled. Pertinent negatives include no chest pain, headaches, palpitations or shortness of breath. Past treatments include ACE inhibitors.  Diabetes He presents for his follow-up diabetic visit. He has type 2 diabetes mellitus. His disease course has been stable. Pertinent negatives for hypoglycemia include no dizziness, headaches or tremors. Pertinent negatives for diabetes include no chest pain, no fatigue, no polydipsia and no polyuria. His weight is stable. He monitors blood glucose at home 3-4 x per week. His breakfast blood glucose is taken between 7-8 am. His breakfast blood glucose range is generally 130-140 mg/dl.  He is still drinking 3 shots of alcohol daily.  We discussed trying to cut back to 2 to reach the acceptable male alcohol intake recommendations. He is also still having hot flashes since his radiation for prostate cancer.  His oncologist thought that they would have resolved by now. CTS - using wrist splints at night with good effect.  Not eager to have surgery but not sure if he needs it.  He denies weakness in his hands as well as persistent numbness.  Lab Results  Component Value Date   HGBA1C 6.7* 01/13/2015     Review of Systems  Constitutional: Positive for diaphoresis (sweats from testosterone depletion). Negative for appetite change, fatigue and unexpected weight change.  HENT: Negative for ear  pain and hearing loss.   Eyes: Negative for visual disturbance.  Respiratory: Positive for cough. Negative for shortness of breath and wheezing.   Cardiovascular: Negative for chest pain, palpitations and leg swelling.  Gastrointestinal: Negative for abdominal pain and blood in stool.  Endocrine: Negative for polydipsia and polyuria.  Genitourinary: Negative for dysuria, hematuria and difficulty urinating.  Musculoskeletal: Negative for back pain and arthralgias.  Skin: Negative for color change and rash.  Neurological: Negative for dizziness, tremors, numbness and headaches.  Psychiatric/Behavioral: Negative for sleep disturbance and dysphoric mood.    Patient Active Problem List   Diagnosis Date Noted  . Type 2 diabetes mellitus without complication, without long-term current use of insulin ([place]) 07/26/2015  . Continuous chronic alcoholism ([place]) 03/30/2015  . Carpal tunnel syndrome 03/30/2015  . Impaired renal function 03/30/2015  . Elevated blood pressure, situational 03/30/2015  . Abnormal LFTs 03/30/2015  . Failure of erection 03/30/2015  . H/O adenomatous polyp of colon 03/30/2015  . Tobacco use disorder, moderate, in sustained remission 03/30/2015  . Malignant neoplasm of prostate ([place]) 09/02/2013  . Posterior calcaneal exostosis 02/26/2013  . Calcific Achilles tendinitis 02/26/2013    Prior to Admission medications   Medication Sig Start Date End Date Taking? Authorizing Provider  aspirin 325 MG tablet Take by mouth.   Yes Historical Provider, MD  lisinopril (PRINIVIL,ZESTRIL) 5 MG tablet TAKE 1 TABLET BY MOUTH EVERY DAY 08/06/15  Yes Glean Hess, MD  metFORMIN (GLUCOPHAGE) 1000 MG tablet TAKE 1 TABLET BY MOUTH TWICE DAILY 11/17/15  Yes Glean Hess, MD    No Known Allergies  Past Surgical History  Procedure Laterality Date  . Replacement total knee Bilateral   . Prostate biopsy  2012    malignant  . Colonoscopy  2008    benign polyps    Social History    Substance Use Topics  . Smoking status: Former Research scientist (life sciences)  . Smokeless tobacco: None  . Alcohol Use: 12.0 oz/week    20 Standard drinks or equivalent per week     Medication list has been reviewed and updated.   Physical Exam  Constitutional: He is oriented to person, place, and time. He appears well-developed. No distress.  HENT:  Head: Normocephalic and atraumatic.  Neck: Normal range of motion. Neck supple. Carotid bruit is not present.  Cardiovascular: Normal rate, regular rhythm and normal heart sounds.   No murmur heard. Pulmonary/Chest: Effort normal and breath sounds normal. No respiratory distress. He has no wheezes. He has no rales.  Abdominal: Soft.  Musculoskeletal: Normal range of motion.  Neurological: He is alert and oriented to person, place, and time. He has normal reflexes.  Skin: Skin is warm and dry. No rash noted.  Psychiatric: He has a normal mood and affect. His behavior is normal. Thought content normal.  Nursing note and vitals reviewed.   BP 117/72 mmHg  Pulse 97  Temp(Src) 97.7 F (36.5 C)  Resp 16  Ht 5\' 8"  (1.727 m)  Wt 239 lb (108.41 kg)  BMI 36.35 kg/m2  SpO2 96%  Assessment and Plan: 1. Type 2 diabetes mellitus with stage 3 chronic kidney disease, without long-term current use of insulin (HCC) Stop ACEI; wait one week then gegin cozaar - losartan (COZAAR) 50 MG tablet; Take 1 tablet (50 mg total) by mouth daily.  Dispense: 90 tablet; Refill: 3 - Basic metabolic panel - Hemoglobin A1c  2. Elevated blood pressure, situational Begin cozaar  3. Bilateral carpal tunnel syndrome Continue use of splints while sleeping No indication for surgery at this time  4. Malignant neoplasm of prostate ([place]) Followed by Oncology Consult oncology regarding therapy to reduce hot flashes other than testosterone  5. Alcohol use disorder ([place]) Encouraged patient to cut back to no more than 2 drinks per day  6. Tobacco use disorder, moderate, in  sustained remission   Halina Maidens, MD [facility]  01/19/2016

## 2016-01-19 NOTE — Patient Instructions (Signed)
DASH Eating Plan  DASH stands for "Dietary Approaches to Stop Hypertension." The DASH eating plan is a healthy eating plan that has been shown to reduce high blood pressure (hypertension). Additional health benefits may include reducing the risk of type 2 diabetes mellitus, heart disease, and stroke. The DASH eating plan may also help with weight loss.  WHAT DO I NEED TO KNOW ABOUT THE DASH EATING PLAN?  For the DASH eating plan, you will follow these general guidelines:  · Choose foods with a percent daily value for sodium of less than 5% (as listed on the food label).  · Use salt-free seasonings or herbs instead of table salt or sea salt.  · Check with your health care provider or pharmacist before using salt substitutes.  · Eat lower-sodium products, often labeled as "lower sodium" or "no salt added."  · Eat fresh foods.  · Eat more vegetables, fruits, and low-fat dairy products.  · Choose whole grains. Look for the word "whole" as the first word in the ingredient list.  · Choose fish and skinless chicken or turkey more often than red meat. Limit fish, poultry, and meat to 6 oz (170 g) each day.  · Limit sweets, desserts, sugars, and sugary drinks.  · Choose heart-healthy fats.  · Limit cheese to 1 oz (28 g) per day.  · Eat more home-cooked food and less restaurant, buffet, and fast food.  · Limit fried foods.  · Cook foods using methods other than frying.  · Limit canned vegetables. If you do use them, rinse them well to decrease the sodium.  · When eating at a restaurant, ask that your food be prepared with less salt, or no salt if possible.  WHAT FOODS CAN I EAT?  Seek help from a dietitian for individual calorie needs.  Grains  Whole grain or whole wheat bread. Brown rice. Whole grain or whole wheat pasta. Quinoa, bulgur, and whole grain cereals. Low-sodium cereals. Corn or whole wheat flour tortillas. Whole grain cornbread. Whole grain crackers. Low-sodium crackers.  Vegetables  Fresh or frozen vegetables  (raw, steamed, roasted, or grilled). Low-sodium or reduced-sodium tomato and vegetable juices. Low-sodium or reduced-sodium tomato sauce and paste. Low-sodium or reduced-sodium canned vegetables.   Fruits  All fresh, canned (in natural juice), or frozen fruits.  Meat and Other Protein Products  Ground beef (85% or leaner), grass-fed beef, or beef trimmed of fat. Skinless chicken or turkey. Ground chicken or turkey. Pork trimmed of fat. All fish and seafood. Eggs. Dried beans, peas, or lentils. Unsalted nuts and seeds. Unsalted canned beans.  Dairy  Low-fat dairy products, such as skim or 1% milk, 2% or reduced-fat cheeses, low-fat ricotta or cottage cheese, or plain low-fat yogurt. Low-sodium or reduced-sodium cheeses.  Fats and Oils  Tub margarines without trans fats. Light or reduced-fat mayonnaise and salad dressings (reduced sodium). Avocado. Safflower, olive, or canola oils. Natural peanut or almond butter.  Other  Unsalted popcorn and pretzels.  The items listed above may not be a complete list of recommended foods or beverages. Contact your dietitian for more options.  WHAT FOODS ARE NOT RECOMMENDED?  Grains  White bread. White pasta. White rice. Refined cornbread. Bagels and croissants. Crackers that contain trans fat.  Vegetables  Creamed or fried vegetables. Vegetables in a cheese sauce. Regular canned vegetables. Regular canned tomato sauce and paste. Regular tomato and vegetable juices.  Fruits  Dried fruits. Canned fruit in light or heavy syrup. Fruit juice.  Meat and Other Protein   Products  Fatty cuts of meat. Ribs, chicken wings, bacon, sausage, bologna, salami, chitterlings, fatback, hot dogs, bratwurst, and packaged luncheon meats. Salted nuts and seeds. Canned beans with salt.  Dairy  Whole or 2% milk, cream, half-and-half, and cream cheese. Whole-fat or sweetened yogurt. Full-fat cheeses or blue cheese. Nondairy creamers and whipped toppings. Processed cheese, cheese spreads, or cheese  curds.  Condiments  Onion and garlic salt, seasoned salt, table salt, and sea salt. Canned and packaged gravies. Worcestershire sauce. Tartar sauce. Barbecue sauce. Teriyaki sauce. Soy sauce, including reduced sodium. Steak sauce. Fish sauce. Oyster sauce. Cocktail sauce. Horseradish. Ketchup and mustard. Meat flavorings and tenderizers. Bouillon cubes. Hot sauce. Tabasco sauce. Marinades. Taco seasonings. Relishes.  Fats and Oils  Butter, stick margarine, lard, shortening, ghee, and bacon fat. Coconut, palm kernel, or palm oils. Regular salad dressings.  Other  Pickles and olives. Salted popcorn and pretzels.  The items listed above may not be a complete list of foods and beverages to avoid. Contact your dietitian for more information.  WHERE CAN I FIND MORE INFORMATION?  National Heart, Lung, and Blood Institute: www.nhlbi.nih.gov/health/health-topics/topics/dash/     This information is not intended to replace advice given to you by your health care provider. Make sure you discuss any questions you have with your health care provider.     Document Released: 08/10/2011 Document Revised: 09/11/2014 Document Reviewed: 06/25/2013  Elsevier Interactive Patient Education ©2016 [employer]

## 2016-01-20 LAB — BASIC METABOLIC PANEL
BUN/Creatinine Ratio: 23 (ref 10–24)
BUN: 20 mg/dL (ref 8–27)
CO2: 21 mmol/L (ref 18–29)
CREATININE: 0.87 mg/dL (ref 0.76–1.27)
Calcium: 9.9 mg/dL (ref 8.6–10.2)
Chloride: 98 mmol/L (ref 96–106)
GFR calc Af Amer: 98 mL/min/{1.73_m2} (ref >59)
GFR, EST NON AFRICAN AMERICAN: 85 mL/min/{1.73_m2} (ref >59)
GLUCOSE: 223 mg/dL — AB (ref 65–99)
Potassium: 4.7 mmol/L (ref 3.5–5.2)
Sodium: 140 mmol/L (ref 134–144)

## 2016-01-20 LAB — HEMOGLOBIN A1C
ESTIMATED AVERAGE GLUCOSE: 163 mg/dL
HEMOGLOBIN A1C: 7.3 % — AB (ref 4.8–5.6)

## 2016-01-24 ENCOUNTER — Ambulatory Visit: Payer: Medicare Other | Admitting: Internal Medicine

## 2016-02-01 ENCOUNTER — Other Ambulatory Visit: Payer: Self-pay | Admitting: Internal Medicine

## 2016-02-01 MED ORDER — METFORMIN HCL 1000 MG PO TABS: 1000.0000 mg | ORAL_TABLET | Freq: Two times a day (BID) | ORAL | Status: DC

## 2016-07-28 ENCOUNTER — Encounter: Payer: Medicare Other | Admitting: Internal Medicine

## 2016-08-07 ENCOUNTER — Ambulatory Visit (INDEPENDENT_AMBULATORY_CARE_PROVIDER_SITE_OTHER): Payer: Medicare Other | Admitting: Internal Medicine

## 2016-08-07 ENCOUNTER — Encounter: Payer: Self-pay | Admitting: Internal Medicine

## 2016-08-07 ENCOUNTER — Other Ambulatory Visit: Payer: Self-pay | Admitting: Internal Medicine

## 2016-08-07 VITALS — BP 112/78 | HR 87 | Resp 16 | Ht 68.0 in | Wt 236.0 lb

## 2016-08-07 DIAGNOSIS — R030 Elevated blood-pressure reading, without diagnosis of hypertension: Secondary | ICD-10-CM

## 2016-08-07 DIAGNOSIS — IMO0002 Reserved for concepts with insufficient information to code with codable children: Secondary | ICD-10-CM

## 2016-08-07 DIAGNOSIS — C61 Malignant neoplasm of prostate: Secondary | ICD-10-CM

## 2016-08-07 DIAGNOSIS — Z0000 Encounter for general adult medical examination without abnormal findings: Secondary | ICD-10-CM

## 2016-08-07 DIAGNOSIS — E119 Type 2 diabetes mellitus without complications: Secondary | ICD-10-CM

## 2016-08-07 DIAGNOSIS — R7989 Other specified abnormal findings of blood chemistry: Secondary | ICD-10-CM

## 2016-08-07 DIAGNOSIS — R945 Abnormal results of liver function studies: Secondary | ICD-10-CM

## 2016-08-07 DIAGNOSIS — F1099 Alcohol use, unspecified with unspecified alcohol-induced disorder: Secondary | ICD-10-CM | POA: Diagnosis not present

## 2016-08-07 LAB — POCT URINALYSIS DIPSTICK
Bilirubin, UA: NEGATIVE
Glucose, UA: NEGATIVE
KETONES UA: NEGATIVE
LEUKOCYTES UA: NEGATIVE
NITRITE UA: NEGATIVE
PH UA: 6
PROTEIN UA: NEGATIVE
RBC UA: NEGATIVE
Spec Grav, UA: 1.02
Urobilinogen, UA: 0.2

## 2016-08-07 MED ORDER — METFORMIN HCL 1000 MG PO TABS: 1000.0000 mg | ORAL_TABLET | Freq: Two times a day (BID) | ORAL | 1 refills | Status: DC

## 2016-08-07 NOTE — Progress Notes (Signed)
Patient: Thomas Allen, Male    DOB: [birth year], 75 y.o.   MRN: [id number] Visit Date: 08/07/2016  Today's Provider: Halina Maidens, MD   Chief Complaint  Patient presents with  . Medicare Wellness   Subjective:    Annual wellness visit Thomas Allen is a 75 y.o. male who presents today for his Subsequent Annual Wellness Visit. He feels fairly well. He reports exercising walking three times a week. He reports he is sleeping well.   ----------------------------------------------------------- Diabetes  He presents for his follow-up diabetic visit. He has type 2 diabetes mellitus. His disease course has been stable. Pertinent negatives for hypoglycemia include no dizziness or headaches. Pertinent negatives for diabetes include no chest pain and no fatigue. Current diabetic treatment includes oral agent (monotherapy). He is compliant with treatment most of the time. His weight is stable. He monitors blood glucose at home 1-2 x per week. His breakfast blood glucose is taken between 6-7 am. His breakfast blood glucose range is generally 130-140 mg/dl. An ACE inhibitor/angiotensin II receptor blocker is not being taken. Eye exam is not current.  Hypertension  This is a recurrent problem. The problem has been resolved since onset. Pertinent negatives include no chest pain, headaches, palpitations or shortness of breath. Risk factors for coronary artery disease include diabetes mellitus. Past treatments include angiotensin blockers. There is no history of kidney disease or CAD/MI.  Prostate cancer - completed external beam radiation and Lupron therapy.  He was started on Megace for hot flashes by his oncologist. Sleep Apnea - dx with OSA about 8 years ago in [place].  Used CPAP for a while but when he moved here the machine was stolen.  He is not sure that he felt better using it.  His wife has not witnessed any apnea or gasping.   Lab Results  Component Value Date   HGBA1C 7.3 (H) 01/19/2016   Lab  Results  Component Value Date   ALT 100 (A) 07/21/2014   AST 56 (A) 07/21/2014   ALKPHOS 52 08/23/2013   BILITOT 0.8 08/23/2013   Lab Results  Component Value Date   CREATININE 0.87 01/19/2016     Review of Systems  Constitutional: Negative for appetite change, chills, diaphoresis, fatigue and unexpected weight change.  HENT: Negative for hearing loss, tinnitus, trouble swallowing and voice change.   Eyes: Negative for visual disturbance.  Respiratory: Negative for choking, chest tightness, shortness of breath and wheezing.   Cardiovascular: Negative for chest pain, palpitations and leg swelling.  Gastrointestinal: Negative for abdominal pain, blood in stool, constipation and diarrhea.  Genitourinary: Negative for difficulty urinating, dysuria, frequency and hematuria.  Musculoskeletal: Negative for arthralgias, back pain and myalgias.  Skin: Negative for color change and rash.  Neurological: Negative for dizziness, syncope and headaches.  Hematological: Negative for adenopathy.  Psychiatric/Behavioral: Negative for dysphoric mood and sleep disturbance.    Social History   Social History  . Marital status: Married    Spouse name: N/A  . Number of children: N/A  . Years of education: N/A   Occupational History  . Not on file.   Social History Main Topics  . Smoking status: Former Research scientist (life sciences)  . Smokeless tobacco: Never Used  . Alcohol use 12.0 oz/week    20 Standard drinks or equivalent per week  . Drug use: No  . Sexual activity: Not on file   Other Topics Concern  . Not on file   Social History Narrative  . No narrative on file  Patient Active Problem List   Diagnosis Date Noted  . Alcohol use disorder ([place]) 01/19/2016  . Type 2 diabetes mellitus without complication, without long-term current use of insulin ([place]) 07/26/2015  . Carpal tunnel syndrome 03/30/2015  . Elevated blood pressure, situational 03/30/2015  . Abnormal LFTs 03/30/2015  . Failure of  erection 03/30/2015  . H/O adenomatous polyp of colon 03/30/2015  . Tobacco use disorder, moderate, in sustained remission 03/30/2015  . Malignant neoplasm of prostate ([place]) 09/02/2013  . Posterior calcaneal exostosis 02/26/2013  . Calcific Achilles tendinitis 02/26/2013    Past Surgical History:  Procedure Laterality Date  . COLONOSCOPY  2008   benign polyps  . PROSTATE BIOPSY  2012   malignant  . REPLACEMENT TOTAL KNEE Bilateral     His family history includes Diabetes in his mother.     Previous Medications   ASPIRIN 325 MG TABLET    Take by mouth.   MEGESTROL (MEGACE) 20 MG TABLET    Take 1 tablet by mouth daily.   METFORMIN (GLUCOPHAGE) 1000 MG TABLET    Take 1 tablet (1,000 mg total) by mouth 2 (two) times daily.    Patient Care Team: Glean Hess, MD as PCP - General (Internal Medicine) Lollie Sails, MD as Consulting Physician (Gastroenterology)      Objective:   Vitals: BP 112/78   Pulse 87   Resp 16   Ht 5\' 8"  (1.727 m)   Wt 236 lb (107 kg)   SpO2 100%   BMI 35.88 kg/m   Physical Exam  Constitutional: He is oriented to person, place, and time. He appears well-developed and well-nourished.  HENT:  Head: Normocephalic.  Right Ear: Tympanic membrane, external ear and ear canal normal.  Left Ear: Tympanic membrane, external ear and ear canal normal.  Nose: Nose normal.  Mouth/Throat: Uvula is midline and oropharynx is clear and moist.  Eyes: Conjunctivae and EOM are normal. Pupils are equal, round, and reactive to light.  Neck: Normal range of motion. Neck supple. Carotid bruit is not present. No thyromegaly present.  Cardiovascular: Normal rate, regular rhythm, normal heart sounds and intact distal pulses.   Pulmonary/Chest: Effort normal and breath sounds normal. He has no wheezes. Right breast exhibits no mass. Left breast exhibits no mass.  Abdominal: Soft. Normal appearance and bowel sounds are normal. There is no hepatosplenomegaly. There is no  tenderness.  Musculoskeletal: Normal range of motion.  Lymphadenopathy:    He has no cervical adenopathy.  Neurological: He is alert and oriented to person, place, and time. He has normal reflexes.  Skin: Skin is warm, dry and intact.  Psychiatric: He has a normal mood and affect. His speech is normal and behavior is normal. Judgment and thought content normal.  Nursing note and vitals reviewed.   Activities of Daily Living In your present state of health, do you have any difficulty performing the following activities: 08/07/2016 01/19/2016  Hearing? N N  Vision? N N  Difficulty concentrating or making decisions? N N  Walking or climbing stairs? N N  Dressing or bathing? N N  Doing errands, shopping? N N  Preparing Food and eating ? N -  Using the Toilet? N -  In the past six months, have you accidently leaked urine? N -  Do you have problems with loss of bowel control? N -  Managing your Medications? N -  Managing your Finances? N -  Housekeeping or managing your Housekeeping? N -  Some recent data might  be hidden    Fall Risk Assessment Fall Risk  08/07/2016 01/19/2016 07/26/2015  Falls in the past year? No No No     Depression Screen PHQ 2/9 Scores 08/07/2016 01/19/2016 07/26/2015  PHQ - 2 Score 0 0 0   6CIT Screen 08/07/2016  What Year? 0 points  What month? 0 points  What time? 0 points  Count back from 20 0 points  Months in reverse 0 points  Repeat phrase 0 points  Total Score 0      Medicare Annual Wellness Visit Summary:  Reviewed patient's Family Medical History Reviewed and updated list of patient's medical providers Assessment of cognitive impairment was done Assessed patient's functional ability Established a written schedule for health screening [place] Completed and Reviewed  Exercise Activities and Dietary recommendations Goals    None       There is no immunization history on file for this patient.  Health Maintenance   Topic Date Due  . OPHTHALMOLOGY EXAM  10/06/2015  . HEMOGLOBIN A1C  07/21/2016  . FOOT EXAM  07/25/2016  . INFLUENZA VACCINE  09/05/2023 (Originally 04/04/2016)  . ZOSTAVAX  09/05/2023 (Originally 11/21/2001)  . TETANUS/TDAP  09/05/2023 (Originally 11/21/1960)  . PNA vac Low Risk Adult (1 of 2 - PCV13) 09/05/2023 (Originally 11/22/2006)  . COLONOSCOPY  11/18/2023    Discussed health benefits of physical activity, and encouraged him to engage in regular exercise appropriate for his age and condition.    ------------------------------------------------------------------------------------------------------------  Assessment & Plan:   1. Medicare annual wellness visit, subsequent Measures satisfied Patient declines all vaccinations - POCT urinalysis dipstick  2. Type 2 diabetes mellitus without complication, without long-term current use of insulin (HCC) Controlled - adjust medication if needed - Comprehensive metabolic panel - Hemoglobin A1c - Lipid panel - TSH - Microalbumin / creatinine urine ratio  3. Elevated blood pressure, situational Controlled on no medication Should probably be on ACE or ARB but pt declines - CBC with Differential/Platelet  4. Malignant neoplasm of prostate ([place]) Followed by [facility] on megace for hot flashes  5. Alcohol use disorder (HCC) Improved - down to 2 drinks per day  6. Abnormal LFTs Need Hep C testing - Comprehensive metabolic panel   Halina Maidens, MD [facility]  08/07/2016

## 2016-08-07 NOTE — Patient Instructions (Addendum)
Schedule Eye exam - [place] [phone] [facility])   Health Maintenance  Topic Date Due  . OPHTHALMOLOGY EXAM  10/06/2015  . INFLUENZA VACCINE  09/05/2023 (Originally 04/04/2016)  . ZOSTAVAX  09/05/2023 (Originally 11/21/2001)  . TETANUS/TDAP  09/05/2023 (Originally 11/21/1960)  . PNA vac Low Risk Adult (1 of 2 - PCV13) 09/05/2023 (Originally 11/22/2006)  . HEMOGLOBIN A1C  02/05/2017  . FOOT EXAM  08/07/2017  . COLONOSCOPY  11/18/2023

## 2016-08-08 LAB — COMPREHENSIVE METABOLIC PANEL
A/G RATIO: 1.4 (ref 1.2–2.2)
ALBUMIN: 4.3 g/dL (ref 3.5–4.8)
ALT: 59 IU/L — ABNORMAL HIGH (ref 0–44)
AST: 35 IU/L (ref 0–40)
Alkaline Phosphatase: 57 IU/L (ref 39–117)
BUN / CREAT RATIO: 18 (ref 10–24)
BUN: 15 mg/dL (ref 8–27)
Bilirubin Total: 0.7 mg/dL (ref 0.0–1.2)
CALCIUM: 9.7 mg/dL (ref 8.6–10.2)
CO2: 17 mmol/L — ABNORMAL LOW (ref 18–29)
Chloride: 100 mmol/L (ref 96–106)
Creatinine, Ser: 0.85 mg/dL (ref 0.76–1.27)
GFR, EST AFRICAN AMERICAN: 99 mL/min/{1.73_m2} (ref >59)
GFR, EST NON AFRICAN AMERICAN: 86 mL/min/{1.73_m2} (ref >59)
GLOBULIN, TOTAL: 3 g/dL (ref 1.5–4.5)
Glucose: 178 mg/dL — ABNORMAL HIGH (ref 65–99)
POTASSIUM: 4.6 mmol/L (ref 3.5–5.2)
SODIUM: 137 mmol/L (ref 134–144)
TOTAL PROTEIN: 7.3 g/dL (ref 6.0–8.5)

## 2016-08-08 LAB — HEMOGLOBIN A1C
Est. average glucose Bld gHb Est-mCnc: 166 mg/dL
Hgb A1c MFr Bld: 7.4 % — ABNORMAL HIGH (ref 4.8–5.6)

## 2016-08-08 LAB — LIPID PANEL
CHOL/HDL RATIO: 4.4 ratio (ref 0.0–5.0)
CHOLESTEROL TOTAL: 166 mg/dL (ref 100–199)
HDL: 38 mg/dL — ABNORMAL LOW (ref >39)
LDL CALC: 94 mg/dL (ref 0–99)
Triglycerides: 171 mg/dL — ABNORMAL HIGH (ref 0–149)
VLDL CHOLESTEROL CAL: 34 mg/dL (ref 5–40)

## 2016-08-08 LAB — CBC WITH DIFFERENTIAL/PLATELET
BASOS: 0 %
Basophils Absolute: 0 10*3/uL (ref 0.0–0.2)
EOS (ABSOLUTE): 0.2 10*3/uL (ref 0.0–0.4)
EOS: 2 %
HEMATOCRIT: 42.3 % (ref 37.5–51.0)
Hemoglobin: 14.6 g/dL (ref 13.0–17.7)
IMMATURE GRANS (ABS): 0 10*3/uL (ref 0.0–0.1)
IMMATURE GRANULOCYTES: 0 %
LYMPHS: 18 %
Lymphocytes Absolute: 1.6 10*3/uL (ref 0.7–3.1)
MCH: 34.4 pg — AB (ref 26.6–33.0)
MCHC: 34.5 g/dL (ref 31.5–35.7)
MCV: 100 fL — AB (ref 79–97)
Monocytes Absolute: 0.8 10*3/uL (ref 0.1–0.9)
Monocytes: 9 %
NEUTROS ABS: 6.2 10*3/uL (ref 1.4–7.0)
NEUTROS PCT: 71 %
Platelets: 231 10*3/uL (ref 150–379)
RBC: 4.25 x10E6/uL (ref 4.14–5.80)
RDW: 13.1 % (ref 12.3–15.4)
WBC: 8.8 10*3/uL (ref 3.4–10.8)

## 2016-08-08 LAB — HEPATITIS C ANTIBODY: HEP C VIRUS AB: 0.4 {s_co_ratio} (ref 0.0–0.9)

## 2016-08-08 LAB — TSH: TSH: 2.3 u[IU]/mL (ref 0.450–4.500)

## 2016-08-10 LAB — MICROALBUMIN / CREATININE URINE RATIO
CREATININE, UR: 83.9 mg/dL
Microalb/Creat Ratio: 6 mg/g creat (ref 0.0–30.0)
Microalbumin, Urine: 5 ug/mL

## 2016-08-19 ENCOUNTER — Other Ambulatory Visit: Payer: Self-pay | Admitting: Internal Medicine

## 2016-08-19 DIAGNOSIS — E119 Type 2 diabetes mellitus without complications: Secondary | ICD-10-CM

## 2016-11-21 DIAGNOSIS — C61 Malignant neoplasm of prostate: Secondary | ICD-10-CM | POA: Diagnosis not present

## 2017-02-05 ENCOUNTER — Encounter: Payer: Self-pay | Admitting: Internal Medicine

## 2017-02-05 ENCOUNTER — Ambulatory Visit (INDEPENDENT_AMBULATORY_CARE_PROVIDER_SITE_OTHER): Payer: Medicare Other | Admitting: Internal Medicine

## 2017-02-05 VITALS — BP 132/80 | HR 70 | Ht 68.0 in | Wt 235.0 lb

## 2017-02-05 DIAGNOSIS — R066 Hiccough: Secondary | ICD-10-CM

## 2017-02-05 DIAGNOSIS — R945 Abnormal results of liver function studies: Secondary | ICD-10-CM | POA: Diagnosis not present

## 2017-02-05 DIAGNOSIS — R030 Elevated blood-pressure reading, without diagnosis of hypertension: Secondary | ICD-10-CM

## 2017-02-05 DIAGNOSIS — I493 Ventricular premature depolarization: Secondary | ICD-10-CM | POA: Diagnosis not present

## 2017-02-05 DIAGNOSIS — K219 Gastro-esophageal reflux disease without esophagitis: Secondary | ICD-10-CM | POA: Diagnosis not present

## 2017-02-05 DIAGNOSIS — I498 Other specified cardiac arrhythmias: Secondary | ICD-10-CM

## 2017-02-05 DIAGNOSIS — E119 Type 2 diabetes mellitus without complications: Secondary | ICD-10-CM | POA: Diagnosis not present

## 2017-02-05 DIAGNOSIS — R7989 Other specified abnormal findings of blood chemistry: Secondary | ICD-10-CM

## 2017-02-05 HISTORY — DX: Hiccough: R06.6

## 2017-02-05 MED ORDER — RANITIDINE HCL 300 MG PO TABS: 300.0000 mg | ORAL_TABLET | Freq: Every day | ORAL | 5 refills | Status: DC

## 2017-02-05 NOTE — Patient Instructions (Signed)
Gastroesophageal Reflux Scan A gastroesophageal reflux scan is a procedure that is used to check for gastroesophageal reflux, which is the backward flow of stomach contents into the tube that carries food from the mouth to the stomach (esophagus). The scan can also show if any stomach contents are inhaled (aspirated) into your lungs. You may need this scan if you have symptoms such as heartburn, vomiting, swallowing problems, or regurgitation. Regurgitation means that swallowed food is returning from the stomach to the esophagus. For this scan, you will drink a liquid that contains a small amount of a radioactive substance (tracer). A scanner with a camera that detects the radioactive tracer is used to see if any of the material backs up into your esophagus. Tell a health care provider about:  Any allergies you have.  All medicines you are taking, including vitamins, herbs, eye drops, creams, and over-the-counter medicines.  Any blood disorders you have.  Any surgeries you have had.  Any medical conditions you have.  If you are pregnant or you think that you may be pregnant.  If you are breastfeeding. What are the risks? Generally, this is a safe procedure. However, problems may occur, including:  Exposure to radiation (a small amount).  Allergic reaction to the radioactive substance. This is rare.  What happens before the procedure?  Ask your health care provider about changing or stopping your regular medicines. This is especially important if you are taking diabetes medicines or blood thinners.  Follow your health care provider's instructions about eating or drinking restrictions. What happens during the procedure?  You will be asked to drink a liquid that contains a small amount of a radioactive tracer. This liquid will probably be similar to orange juice.  You will assume a position lying on your back.  A series of images will be taken of your esophagus and upper  stomach.  You may be asked to move into different positions to help determine if reflux occurs more often when you are in specific positions.  For adults, an abdominal binder with an inflatable cuff may be placed on the belly (abdomen). This may be used to increase abdominal pressure. More images will be taken to see if the increased pressure causes reflux to occur. The procedure may vary among health care providers and hospitals. What happens after the procedure?  Return to your normal activities and your normal diet as directed by your health care provider.  The radioactive tracer will leave your body over the next few days. Drink enough fluid to keep your urine clear or pale yellow. This will help to flush the tracer out of your body.  It is your responsibility to obtain your test results. Ask your health care provider or the department performing the test when and how you will get your results. This information is not intended to replace advice given to you by your health care provider. Make sure you discuss any questions you have with your health care provider. Document Released: 10/12/2005 Document Revised: 05/15/2016 Document Reviewed: 06/02/2014 Elsevier Interactive Patient Education  2018 Elsevier Inc.  

## 2017-02-05 NOTE — Progress Notes (Signed)
Date:  02/05/2017   Name:  Thomas Allen   DOB:  08/06/1941   MRN:  [id number]   Chief Complaint: Diabetes (Last checked Friday but unsure of what number was.) and Belching (Has been continuously belching for 3 weeks now. no change to diet. No pain. )  Diabetes  He presents for his follow-up diabetic visit. He has type 2 diabetes mellitus. Pertinent negatives for hypoglycemia include no dizziness or headaches. Associated symptoms include chest pain (with hiccups while exercising). Pertinent negatives for diabetes include no blurred vision, no fatigue, no foot ulcerations, no polyuria, no visual change and no weakness.  Gastroesophageal Reflux  He complains of chest pain (with hiccups while exercising) and heartburn. He reports no belching or no coughing. This is a new problem. The current episode started 1 to 4 weeks ago. The problem occurs frequently. The problem has been unchanged. The heartburn is located in the substernum. The heartburn does not wake him from sleep. The heartburn does not limit his activity. The heartburn doesn't change with position. The symptoms are aggravated by ETOH. Pertinent negatives include no fatigue. He has tried nothing for the symptoms.  Hiccups - patient reports onset three weeks ago.  Occur throughout the day but do no interrupt sleep.  Mild heartburn noted and had to stop walking on treadmill due to discomfort with hiccups.  Lab Results  Component Value Date   HGBA1C 7.4 (H) 08/07/2016     Review of Systems  Constitutional: Negative for chills, fatigue and unexpected weight change.  HENT: Negative for trouble swallowing.   Eyes: Negative for blurred vision and visual disturbance.  Respiratory: Negative for cough, chest tightness and shortness of breath.   Cardiovascular: Positive for chest pain (with hiccups while exercising). Negative for palpitations and leg swelling.  Gastrointestinal: Positive for diarrhea and heartburn. Negative for blood in stool.    Endocrine: Negative for polyuria.  Musculoskeletal: Negative for arthralgias.  Neurological: Negative for dizziness, weakness, light-headedness and headaches.  Psychiatric/Behavioral: Negative for sleep disturbance.    Patient Active Problem List   Diagnosis Date Noted  . Alcohol use disorder ([place]) 01/19/2016  . Type 2 diabetes mellitus without complication, without long-term current use of insulin ([place]) 07/26/2015  . Carpal tunnel syndrome 03/30/2015  . Elevated blood pressure, situational 03/30/2015  . Abnormal LFTs 03/30/2015  . Failure of erection 03/30/2015  . H/O adenomatous polyp of colon 03/30/2015  . Tobacco use disorder, moderate, in sustained remission 03/30/2015  . Malignant neoplasm of prostate ([place]) 09/02/2013  . Posterior calcaneal exostosis 02/26/2013  . Calcific Achilles tendinitis 02/26/2013    Prior to Admission medications   Medication Sig Start Date End Date Taking? Authorizing Provider  aspirin 325 MG tablet Take by mouth.   Yes [provider]  megestrol (MEGACE) 20 MG tablet Take 1 tablet by mouth daily. 06/30/16  Yes [provider]  metFORMIN (GLUCOPHAGE) 1000 MG tablet TAKE 1 TABLET BY MOUTH TWICE DAILY 08/21/16  Yes Glean Hess, MD  Multiple Vitamins-Minerals (MULTIVITAMIN WITH MINERALS) tablet Take 1 tablet by mouth daily.   Yes [provider]    Allergies  Allergen Reactions  . Ace Inhibitors Cough    Past Surgical History:  Procedure Laterality Date  . COLONOSCOPY  2008   benign polyps  . PROSTATE BIOPSY  2012   malignant  . REPLACEMENT TOTAL KNEE Bilateral     Social History  Substance Use Topics  . Smoking status: Former Smoker    Quit date:  1976  . Smokeless tobacco: Never Used  . Alcohol use 20.4 oz/week    20 Standard drinks or equivalent, 14 Shots of liquor per week   BP Readings from Last 3 Encounters:  02/05/17 138/68  08/07/16 112/78  01/19/16 117/72    Medication list has been  reviewed and updated.   Physical Exam  Constitutional: He is oriented to person, place, and time. He appears well-developed. No distress.  HENT:  Head: Normocephalic and atraumatic.  Neck: Normal range of motion.  Cardiovascular: Normal rate, normal heart sounds and intact distal pulses.  An irregular rhythm present.  No murmur heard. Pulmonary/Chest: Effort normal and breath sounds normal. No respiratory distress. He has no wheezes. He has no rhonchi.  Abdominal: Soft. Normal appearance. There is no tenderness.  Musculoskeletal: Normal range of motion.  Neurological: He is alert and oriented to person, place, and time.  Intermittent hiccups observed  Skin: Skin is warm and dry. No rash noted.  Psychiatric: He has a normal mood and affect. His speech is normal and behavior is normal. Thought content normal.  Nursing note and vitals reviewed.   BP 138/68 (BP Location: Right Arm, Patient Position: Sitting, Cuff Size: Large)   Pulse 88   Ht 5\' 8"  (1.727 m)   Wt 235 lb (106.6 kg)   SpO2 97%   BMI 35.73 kg/m   Assessment and Plan: 1. Elevated blood pressure, situational Pt declines BP meds - may consider starting beta blocker if PVCs persist  2. Type 2 diabetes mellitus without complication, without long-term current use of insulin (HCC) Continue oral agents Check BS at least once a week - Hemoglobin A1c  3. Abnormal LFTs - Comprehensive metabolic panel  4. Other cardiac arrhythmia EKG with PVCs only - pt reassured - EKG 12-Lead - TSH  5. Intractable hiccups Will hopefully resolve - follow up one month Discussed thorazine but pt declines at this time  6. Gastroesophageal reflux disease, esophagitis presence not specified May be contributing to hiccups - will treat with high dose Zanatc and follow up one month - H. pylori antibody, IgG   No orders of the defined types were placed in this encounter.   Halina Maidens, MD [facility]  02/05/2017

## 2017-02-06 ENCOUNTER — Other Ambulatory Visit: Payer: Self-pay | Admitting: Internal Medicine

## 2017-02-06 DIAGNOSIS — E119 Type 2 diabetes mellitus without complications: Secondary | ICD-10-CM

## 2017-02-06 DIAGNOSIS — A048 Other specified bacterial intestinal infections: Secondary | ICD-10-CM

## 2017-02-06 LAB — COMPREHENSIVE METABOLIC PANEL
A/G RATIO: 1.7 (ref 1.2–2.2)
ALBUMIN: 4.4 g/dL (ref 3.5–4.8)
ALT: 36 IU/L (ref 0–44)
AST: 18 IU/L (ref 0–40)
Alkaline Phosphatase: 52 IU/L (ref 39–117)
BUN/Creatinine Ratio: 21 (ref 10–24)
BUN: 21 mg/dL (ref 8–27)
Bilirubin Total: 0.7 mg/dL (ref 0.0–1.2)
CALCIUM: 9.7 mg/dL (ref 8.6–10.2)
CO2: 19 mmol/L (ref 18–29)
CREATININE: 0.98 mg/dL (ref 0.76–1.27)
Chloride: 105 mmol/L (ref 96–106)
GFR, EST AFRICAN AMERICAN: 87 mL/min/{1.73_m2} (ref >59)
GFR, EST NON AFRICAN AMERICAN: 75 mL/min/{1.73_m2} (ref >59)
GLOBULIN, TOTAL: 2.6 g/dL (ref 1.5–4.5)
Glucose: 179 mg/dL — ABNORMAL HIGH (ref 65–99)
POTASSIUM: 4.9 mmol/L (ref 3.5–5.2)
SODIUM: 142 mmol/L (ref 134–144)
TOTAL PROTEIN: 7 g/dL (ref 6.0–8.5)

## 2017-02-06 LAB — HEMOGLOBIN A1C
Est. average glucose Bld gHb Est-mCnc: 177 mg/dL
Hgb A1c MFr Bld: 7.8 % — ABNORMAL HIGH (ref 4.8–5.6)

## 2017-02-06 LAB — TSH: TSH: 2.04 u[IU]/mL (ref 0.450–4.500)

## 2017-02-06 LAB — H. PYLORI ANTIBODY, IGG: H. pylori, IgG AbS: 0.97 Index Value — ABNORMAL HIGH (ref 0.00–0.79)

## 2017-02-06 MED ORDER — OMEPRAZOLE 20 MG PO CPDR: 20.0000 mg | DELAYED_RELEASE_CAPSULE | Freq: Two times a day (BID) | ORAL | 3 refills | Status: DC

## 2017-02-06 MED ORDER — CLARITHROMYCIN 500 MG PO TABS: 500.0000 mg | ORAL_TABLET | Freq: Two times a day (BID) | ORAL | 0 refills | Status: AC

## 2017-02-06 MED ORDER — EMPAGLIFLOZIN 10 MG PO TABS: 10.0000 mg | ORAL_TABLET | Freq: Every day | ORAL | 5 refills | Status: DC

## 2017-02-06 MED ORDER — AMOXICILLIN 500 MG PO CAPS: 1000.0000 mg | ORAL_CAPSULE | Freq: Two times a day (BID) | ORAL | 0 refills | Status: AC

## 2017-02-13 DIAGNOSIS — H2513 Age-related nuclear cataract, bilateral: Secondary | ICD-10-CM | POA: Diagnosis not present

## 2017-02-13 DIAGNOSIS — E119 Type 2 diabetes mellitus without complications: Secondary | ICD-10-CM | POA: Diagnosis not present

## 2017-02-13 DIAGNOSIS — H40003 Preglaucoma, unspecified, bilateral: Secondary | ICD-10-CM | POA: Diagnosis not present

## 2017-02-13 LAB — HM DIABETES EYE EXAM

## 2017-02-16 DIAGNOSIS — Z86010 Personal history of colonic polyps: Secondary | ICD-10-CM | POA: Diagnosis not present

## 2017-02-16 DIAGNOSIS — R066 Hiccough: Secondary | ICD-10-CM | POA: Diagnosis not present

## 2017-02-16 DIAGNOSIS — R142 Eructation: Secondary | ICD-10-CM | POA: Diagnosis not present

## 2017-03-12 ENCOUNTER — Ambulatory Visit (INDEPENDENT_AMBULATORY_CARE_PROVIDER_SITE_OTHER): Payer: Medicare Other | Admitting: Internal Medicine

## 2017-03-12 ENCOUNTER — Encounter: Payer: Self-pay | Admitting: Internal Medicine

## 2017-03-12 VITALS — BP 126/62 | HR 90 | Ht 68.0 in | Wt 232.0 lb

## 2017-03-12 DIAGNOSIS — A048 Other specified bacterial intestinal infections: Secondary | ICD-10-CM

## 2017-03-12 DIAGNOSIS — E785 Hyperlipidemia, unspecified: Secondary | ICD-10-CM | POA: Diagnosis not present

## 2017-03-12 DIAGNOSIS — E1169 Type 2 diabetes mellitus with other specified complication: Secondary | ICD-10-CM

## 2017-03-12 DIAGNOSIS — E119 Type 2 diabetes mellitus without complications: Secondary | ICD-10-CM | POA: Diagnosis not present

## 2017-03-12 DIAGNOSIS — R030 Elevated blood-pressure reading, without diagnosis of hypertension: Secondary | ICD-10-CM

## 2017-03-12 MED ORDER — OMEPRAZOLE 20 MG PO CPDR: 20.0000 mg | DELAYED_RELEASE_CAPSULE | Freq: Two times a day (BID) | ORAL | 3 refills | Status: DC

## 2017-03-12 NOTE — Progress Notes (Signed)
Date:  03/12/2017   Name:  Thomas Allen   DOB:  [birth year]   MRN:  [id number]   Chief Complaint: Diabetes (BS RANGE- 140-165. Been out of Jardiance for a week- pt states "i don't know if she wanted to renew it or not." - when ran out of jardiance he claims " didn't seem to make a difference." ) Hypertension  This is a recurrent problem. The problem has been waxing and waning since onset. Pertinent negatives include no chest pain, headaches or shortness of breath. Risk factors for coronary artery disease include diabetes mellitus and dyslipidemia. Past treatments include nothing (pt has declined medications).  Diabetes  He presents for his follow-up diabetic visit. He has type 2 diabetes mellitus. His disease course has been worsening. Pertinent negatives for hypoglycemia include no dizziness or headaches. Pertinent negatives for diabetes include no chest pain, no fatigue, no polydipsia and no polyuria. Current diabetic treatment includes oral agent (dual therapy) (metformin and jardiance added last month - ran out last week). His breakfast blood glucose is taken between 6-7 am. His breakfast blood glucose range is generally 110-130 mg/dl.  Hyperlipidemia  This is a chronic problem. The problem is uncontrolled. Exacerbating diseases include diabetes. Pertinent negatives include no chest pain or shortness of breath. He is currently on no antihyperlipidemic treatment (he thinks he took a medication years ago and had a side effect but he does not know what the SE was or the name of the medication).   Lab Results  Component Value Date   HGBA1C 7.8 (H) 02/05/2017   The 10-year ASCVD risk score Mikey Bussing DC Jr., et al., 2013) is: 42.4%   Values used to calculate the score:     Age: 76 years     Sex: Male     Is Non-Hispanic African American: No     Diabetic: Yes     Tobacco smoker: No     Systolic Blood Pressure: 379 mmHg     Is BP treated: No     HDL Cholesterol: 38 mg/dL     Total Cholesterol:  166 mg/dL  GERD/H Pylori - treated in June with amox and biaxin and told to take omeprazole bid for 2 more months.  He has stopped the omeprazole and is no longer taking ranitidine.  Review of Systems  Constitutional: Negative for chills, fatigue and fever.  Respiratory: Negative for cough, chest tightness and shortness of breath.   Cardiovascular: Negative for chest pain.  Gastrointestinal: Negative for abdominal pain, blood in stool and constipation.  Endocrine: Negative for polydipsia and polyuria.  Genitourinary:       Hiccups are almost completely resolved  Neurological: Negative for dizziness and headaches.    Patient Active Problem List   Diagnosis Date Noted  . Hyperlipidemia associated with type 2 diabetes mellitus ([place]) 03/12/2017  . H. pylori infection 02/06/2017  . Asymptomatic PVCs 02/05/2017  . Intractable hiccups 02/05/2017  . Gastroesophageal reflux disease 02/05/2017  . Alcohol use disorder ([place]) 01/19/2016  . Type 2 diabetes mellitus without complication, without long-term current use of insulin ([place]) 07/26/2015  . Carpal tunnel syndrome 03/30/2015  . Elevated blood pressure, situational 03/30/2015  . Abnormal LFTs 03/30/2015  . Failure of erection 03/30/2015  . H/O adenomatous polyp of colon 03/30/2015  . Tobacco use disorder, moderate, in sustained remission 03/30/2015  . Malignant neoplasm of prostate ([place]) 09/02/2013  . Posterior calcaneal exostosis 02/26/2013  . Calcific Achilles tendinitis 02/26/2013    Prior to Admission medications  Medication Sig Start Date End Date Taking? Authorizing Provider  aspirin 325 MG tablet Take by mouth.    [provider]  empagliflozin (JARDIANCE) 10 MG TABS tablet Take 10 mg by mouth daily. 02/06/17   Glean Hess, MD  megestrol (MEGACE) 20 MG tablet Take 1 tablet by mouth daily. 06/30/16   [provider]  metFORMIN (GLUCOPHAGE) 1000 MG tablet TAKE 1 TABLET BY MOUTH TWICE DAILY 08/21/16    Glean Hess, MD  Multiple Vitamins-Minerals (MULTIVITAMIN WITH MINERALS) tablet Take 1 tablet by mouth daily.    [provider]  omeprazole (PRILOSEC) 20 MG capsule Take 1 capsule (20 mg total) by mouth 2 (two) times daily before a meal. 02/06/17   Glean Hess, MD  ranitidine (ZANTAC) 300 MG tablet Take 1 tablet (300 mg total) by mouth at bedtime. 02/05/17   Glean Hess, MD    Allergies  Allergen Reactions  . Ace Inhibitors Cough    Past Surgical History:  Procedure Laterality Date  . COLONOSCOPY  2008   benign polyps  . PROSTATE BIOPSY  2012   malignant  . REPLACEMENT TOTAL KNEE Bilateral     Social History  Substance Use Topics  . Smoking status: Former Smoker    Quit date: 1976  . Smokeless tobacco: Never Used  . Alcohol use 20.4 oz/week    20 Standard drinks or equivalent, 14 Shots of liquor per week     Medication list has been reviewed and updated.   Physical Exam  Constitutional: He is oriented to person, place, and time. He appears well-developed. No distress.  HENT:  Head: Normocephalic and atraumatic.  Neck: Carotid bruit is not present.  Cardiovascular: Normal rate, regular rhythm and normal heart sounds.   Pulmonary/Chest: Effort normal and breath sounds normal. No respiratory distress. He has no wheezes.  Musculoskeletal: Normal range of motion.  Neurological: He is alert and oriented to person, place, and time.  Skin: Skin is warm and dry. No rash noted.  Psychiatric: He has a normal mood and affect. His behavior is normal. Thought content normal.  Nursing note and vitals reviewed.   BP 126/62   Pulse 90   Ht 5\' 8"  (1.727 m)   Wt 232 lb (105.2 kg)   SpO2 98%   BMI 35.28 kg/m   Assessment and Plan: 1. Elevated blood pressure, situational Improved on no medication Patient does not want to take meds for renal protection  2. Type 2 diabetes mellitus without complication, without long-term current use of insulin  (HCC) Continue metformin Resume Jardiance  3. Hyperlipidemia associated with type 2 diabetes mellitus ([place]) Will discuss statin therapy next visit since risk is very high  4. H. pylori infection Resume omeprazole bid for the next 2 months - omeprazole (PRILOSEC) 20 MG capsule; Take 1 capsule (20 mg total) by mouth 2 (two) times daily before a meal.  Dispense: 60 capsule; Refill: 3   Meds ordered this encounter  Medications  . omeprazole (PRILOSEC) 20 MG capsule    Sig: Take 1 capsule (20 mg total) by mouth 2 (two) times daily before a meal.    Dispense:  60 capsule    Refill:  [place], MD [facility]  03/12/2017

## 2017-03-19 ENCOUNTER — Other Ambulatory Visit: Payer: Self-pay | Admitting: Internal Medicine

## 2017-03-19 DIAGNOSIS — E119 Type 2 diabetes mellitus without complications: Secondary | ICD-10-CM

## 2017-06-02 ENCOUNTER — Other Ambulatory Visit: Payer: Self-pay | Admitting: Internal Medicine

## 2017-06-02 DIAGNOSIS — E119 Type 2 diabetes mellitus without complications: Secondary | ICD-10-CM

## 2017-06-04 ENCOUNTER — Other Ambulatory Visit: Payer: Self-pay | Admitting: Internal Medicine

## 2017-06-04 ENCOUNTER — Telehealth: Payer: Self-pay | Admitting: Internal Medicine

## 2017-06-04 DIAGNOSIS — E119 Type 2 diabetes mellitus without complications: Secondary | ICD-10-CM

## 2017-06-04 MED ORDER — EMPAGLIFLOZIN 10 MG PO TABS: 10.0000 mg | ORAL_TABLET | Freq: Every day | ORAL | 5 refills | Status: DC

## 2017-06-04 NOTE — Telephone Encounter (Signed)
PATIENT IS REQUESTING A REFILL FOR empagliflozin (JARDIANCE) 10 MG TABS tablet -PLEASE ADVISE.

## 2017-06-04 NOTE — Telephone Encounter (Signed)
Please advise- Refill.

## 2017-06-13 ENCOUNTER — Encounter: Payer: Self-pay | Admitting: Internal Medicine

## 2017-06-13 ENCOUNTER — Ambulatory Visit (INDEPENDENT_AMBULATORY_CARE_PROVIDER_SITE_OTHER): Payer: Medicare Other | Admitting: Internal Medicine

## 2017-06-13 ENCOUNTER — Other Ambulatory Visit: Payer: Self-pay | Admitting: Internal Medicine

## 2017-06-13 VITALS — BP 122/66 | HR 97 | Ht 68.0 in

## 2017-06-13 DIAGNOSIS — E119 Type 2 diabetes mellitus without complications: Secondary | ICD-10-CM | POA: Diagnosis not present

## 2017-06-13 DIAGNOSIS — E1169 Type 2 diabetes mellitus with other specified complication: Secondary | ICD-10-CM

## 2017-06-13 DIAGNOSIS — R350 Frequency of micturition: Secondary | ICD-10-CM

## 2017-06-13 DIAGNOSIS — K219 Gastro-esophageal reflux disease without esophagitis: Secondary | ICD-10-CM

## 2017-06-13 DIAGNOSIS — E785 Hyperlipidemia, unspecified: Secondary | ICD-10-CM

## 2017-06-13 LAB — POCT URINALYSIS DIPSTICK
Bilirubin, UA: NEGATIVE
Blood, UA: NEGATIVE
Glucose, UA: NEGATIVE
Ketones, UA: NEGATIVE
LEUKOCYTES UA: NEGATIVE
Nitrite, UA: NEGATIVE
PROTEIN UA: NEGATIVE
Spec Grav, UA: 1.01 (ref 1.010–1.025)
UROBILINOGEN UA: 0.2 U/dL
pH, UA: 5 (ref 5.0–8.0)

## 2017-06-13 MED ORDER — CIPROFLOXACIN HCL 250 MG PO TABS: 250.0000 mg | ORAL_TABLET | Freq: Two times a day (BID) | ORAL | 0 refills | Status: DC

## 2017-06-13 MED ORDER — OMEPRAZOLE 20 MG PO CPDR: 20.0000 mg | DELAYED_RELEASE_CAPSULE | Freq: Two times a day (BID) | ORAL | 12 refills | Status: DC

## 2017-06-13 NOTE — Progress Notes (Signed)
Date:  06/13/2017   Name:  Thomas Allen   DOB:  10-14-1940   MRN:  [id number]   Chief Complaint: Urinary Frequency (Having a lot of urgency to go, with frequent urination. X 2 weeks. )  Urinary Frequency   This is a new problem. The current episode started 1 to 4 weeks ago. The problem has been unchanged. The patient is experiencing no pain. There has been no fever. Associated symptoms include frequency. Pertinent negatives include no chills, hematuria or urgency.  Diabetes  He presents for his follow-up diabetic visit. He has type 2 diabetes mellitus. Pertinent negatives for hypoglycemia include no dizziness or headaches. Associated symptoms include polyuria. Pertinent negatives for diabetes include no chest pain and no fatigue. Symptoms are improving.  He's been on Jardinace  with good weight loss and good blood sugar results in the 1:30 to 140 range. He reports statin prescribed in the past but he never took it. He would be willing to try if his cholesterol indicated a need. He is not on a sore arm due to normal blood pressure and patient's unwillingness to take medication.  He reports frequency of urination The past couple of weeks. No change in medication or diet. He did briefly stop Jardiance. He denies excessive intake of salt. He denies burning stinging odor to the urine or blood. He is status post rustic cancer treatment with irradiation. Denies any trauma or fevers.  Review of Systems  Constitutional: Negative for chills, fatigue and fever.  Respiratory: Negative for chest tightness, shortness of breath and wheezing.   Cardiovascular: Negative for chest pain and palpitations.  Gastrointestinal: Negative for abdominal pain.  Endocrine: Positive for polyuria.  Genitourinary: Positive for frequency. Negative for hematuria, testicular pain and urgency.  Skin: Negative for color change and rash.  Neurological: Negative for dizziness and headaches.    Patient Active Problem List   Diagnosis Date Noted  . Hyperlipidemia associated with type 2 diabetes mellitus ([place]) 03/12/2017  . H. pylori infection 02/06/2017  . Asymptomatic PVCs 02/05/2017  . Intractable hiccups 02/05/2017  . Gastroesophageal reflux disease 02/05/2017  . Alcohol use disorder 01/19/2016  . Type 2 diabetes mellitus without complication, without long-term current use of insulin ([place]) 07/26/2015  . Carpal tunnel syndrome 03/30/2015  . Elevated blood pressure, situational 03/30/2015  . Abnormal LFTs 03/30/2015  . Failure of erection 03/30/2015  . H/O adenomatous polyp of colon 03/30/2015  . Tobacco use disorder, moderate, in sustained remission 03/30/2015  . Malignant neoplasm of prostate ([place]) 09/02/2013  . Posterior calcaneal exostosis 02/26/2013  . Calcific Achilles tendinitis 02/26/2013    Prior to Admission medications   Medication Sig Start Date End Date Taking? Authorizing Provider  aspirin 325 MG tablet Take by mouth.   Yes [provider]  empagliflozin (JARDIANCE) 10 MG TABS tablet Take 10 mg by mouth daily. 06/04/17  Yes Glean Hess, MD  megestrol (MEGACE) 20 MG tablet Take 1 tablet by mouth daily. 06/30/16  Yes [provider]  metFORMIN (GLUCOPHAGE) 1000 MG tablet TAKE 1 TABLET BY MOUTH TWICE DAILY 06/02/17  Yes Glean Hess, MD  Multiple Vitamins-Minerals (MULTIVITAMIN WITH MINERALS) tablet Take 1 tablet by mouth daily.   Yes [provider]  omeprazole (PRILOSEC) 20 MG capsule Take 1 capsule (20 mg total) by mouth 2 (two) times daily before a meal. Patient not taking: Reported on 06/13/2017 03/12/17   Glean Hess, MD    Allergies  Allergen Reactions  . Ace Inhibitors  Cough    Past Surgical History:  Procedure Laterality Date  . COLONOSCOPY  2008   benign polyps  . PROSTATE BIOPSY  2012   malignant  . REPLACEMENT TOTAL KNEE Bilateral     Social History  Substance Use Topics  . Smoking status: Former Smoker    Quit date: 1976  .  Smokeless tobacco: Never Used  . Alcohol use 20.4 oz/week    20 Standard drinks or equivalent, 14 Shots of liquor per week     Medication list has been reviewed and updated.  PHQ 2/9 Scores 08/07/2016 01/19/2016 07/26/2015  PHQ - 2 Score 0 0 0    Physical Exam  Constitutional: He is oriented to person, place, and time. He appears well-developed. No distress.  HENT:  Head: Normocephalic and atraumatic.  Neck: Normal range of motion. Neck supple. No thyromegaly present.  Cardiovascular: Normal rate, regular rhythm and normal heart sounds.   Pulmonary/Chest: Effort normal and breath sounds normal. No respiratory distress.  Neurological: He is alert and oriented to person, place, and time.  Skin: Skin is warm and dry. No rash noted.  Psychiatric: He has a normal mood and affect. His behavior is normal. Thought content normal.  Nursing note and vitals reviewed.   BP 122/66   Pulse 97   Ht 5\' 8"  (1.727 m)   SpO2 97%   Assessment and Plan: 1. Urinary frequency Treat empirically for low grade infection If no improvement, will refer to Urology - ciprofloxacin (CIPRO) 250 MG tablet; Take 1 tablet (250 mg total) by mouth 2 (two) times daily.  Dispense: 6 tablet; Refill: 0 - POCT Urinalysis Dipstick  2. Type 2 diabetes mellitus without complication, without long-term current use of insulin (HCC) Continue current medication including Jardiance Add statin after labs return - Basic metabolic panel - Hemoglobin A1c - Microalbumin / creatinine urine ratio  3. Hyperlipidemia associated with type 2 diabetes mellitus ([place]) - Lipid panel  4. GERD Continue PPI - omeprazole (PRILOSEC) 20 MG capsule; Take 1 capsule (20 mg total) by mouth 2 (two) times daily before a meal.  Dispense: 60 capsule; Refill: 12   Meds ordered this encounter  Medications  . ciprofloxacin (CIPRO) 250 MG tablet    Sig: Take 1 tablet (250 mg total) by mouth 2 (two) times daily.    Dispense:  6 tablet    Refill:   0  . omeprazole (PRILOSEC) 20 MG capsule    Sig: Take 1 capsule (20 mg total) by mouth 2 (two) times daily before a meal.    Dispense:  60 capsule    Refill:  12    Partially dictated using Editor, commissioning. Any errors are unintentional.  Halina Maidens, MD [facility]  06/13/2017

## 2017-06-14 LAB — HEMOGLOBIN A1C
ESTIMATED AVERAGE GLUCOSE: 131 mg/dL
HEMOGLOBIN A1C: 6.2 % — AB (ref 4.8–5.6)

## 2017-06-14 LAB — BASIC METABOLIC PANEL
BUN / CREAT RATIO: 13 (ref 10–24)
BUN: 12 mg/dL (ref 8–27)
CO2: 21 mmol/L (ref 20–29)
Calcium: 10.1 mg/dL (ref 8.6–10.2)
Chloride: 97 mmol/L (ref 96–106)
Creatinine, Ser: 0.9 mg/dL (ref 0.76–1.27)
GFR calc Af Amer: 96 mL/min/{1.73_m2} (ref >59)
GFR, EST NON AFRICAN AMERICAN: 83 mL/min/{1.73_m2} (ref >59)
GLUCOSE: 173 mg/dL — AB (ref 65–99)
POTASSIUM: 4.8 mmol/L (ref 3.5–5.2)
SODIUM: 139 mmol/L (ref 134–144)

## 2017-06-14 LAB — LIPID PANEL
CHOL/HDL RATIO: 3.7 ratio (ref 0.0–5.0)
Cholesterol, Total: 130 mg/dL (ref 100–199)
HDL: 35 mg/dL — ABNORMAL LOW (ref >39)
LDL Calculated: 65 mg/dL (ref 0–99)
Triglycerides: 148 mg/dL (ref 0–149)
VLDL CHOLESTEROL CAL: 30 mg/dL (ref 5–40)

## 2017-06-16 LAB — MICROALBUMIN / CREATININE URINE RATIO
CREATININE, UR: 47 mg/dL
Microalb/Creat Ratio: <6.4 mg/g creat (ref 0.0–30.0)
Microalbumin, Urine: <3 ug/mL

## 2017-06-18 ENCOUNTER — Telehealth: Payer: Self-pay

## 2017-06-18 ENCOUNTER — Other Ambulatory Visit: Payer: Self-pay | Admitting: Internal Medicine

## 2017-06-18 DIAGNOSIS — R350 Frequency of micturition: Secondary | ICD-10-CM | POA: Insufficient documentation

## 2017-06-18 NOTE — Telephone Encounter (Signed)
Patient called stating after taking cirpo for the last few days the urgency to urinate has gone away but still have frequency. Patient agrees to see urology and would like referral. Please Advise.

## 2017-06-26 ENCOUNTER — Encounter: Payer: Self-pay | Admitting: Urology

## 2017-06-26 ENCOUNTER — Ambulatory Visit (INDEPENDENT_AMBULATORY_CARE_PROVIDER_SITE_OTHER): Payer: Medicare Other | Admitting: Urology

## 2017-06-26 VITALS — BP 126/77 | HR 94 | Ht 68.0 in | Wt 218.7 lb

## 2017-06-26 DIAGNOSIS — C61 Malignant neoplasm of prostate: Secondary | ICD-10-CM | POA: Diagnosis not present

## 2017-06-26 DIAGNOSIS — N5235 Erectile dysfunction following radiation therapy: Secondary | ICD-10-CM | POA: Diagnosis not present

## 2017-06-26 DIAGNOSIS — R3915 Urgency of urination: Secondary | ICD-10-CM | POA: Diagnosis not present

## 2017-06-26 DIAGNOSIS — Z125 Encounter for screening for malignant neoplasm of prostate: Secondary | ICD-10-CM | POA: Insufficient documentation

## 2017-06-26 LAB — URINALYSIS, COMPLETE
BILIRUBIN UA: NEGATIVE
Leukocytes, UA: NEGATIVE
Nitrite, UA: NEGATIVE
PH UA: 5.5 (ref 5.0–7.5)
PROTEIN UA: NEGATIVE
RBC UA: NEGATIVE
SPEC GRAV UA: 1.015 (ref 1.005–1.030)
Urobilinogen, Ur: 0.2 mg/dL (ref 0.2–1.0)

## 2017-06-26 LAB — BLADDER SCAN AMB NON-IMAGING: Scan Result: 94

## 2017-06-26 NOTE — Progress Notes (Signed)
06/26/2017 12:51 PM   Thomas Allen 1941-02-22 [id number]  Referring provider: Glean Hess, MD [address] [place], [place] [address]  Chief Complaint  Patient presents with  . Urinary Frequency    HPI:  1. Urinary urgency / Lower Urinary Tract Symptoms - Pt with new urinary urgency / frequency after starting Jardiance. UA without infectious parameters but expected large glycosuria. DRE 2018 scarrified fossa. PVR 2018 "90 mL" (normal for age). He is on Jardiance (SGLT-2) inhibitor that has urinary urgency / frequency as known side effect due to purposeful glycosuria. His overall bother is modest.    2. Prostate cancer-  Treated with 3mos androgen deprivation and EBRT at [facility] for unknown grade / stage cancer. PSA most recently neat undetectable 10/2015 per report.   3 - Erectile Dysfunction - long h/o progressive decline in ability to achieve erection, now no fullness with stimulation after prostate radiation. He is not interested in aggressive treatment.   PMH sig for DM2, knee replacement, open appy. His PCP is Halina Maidens MD with [facility].   PMH: No past medical history on file.  Surgical History: Past Surgical History:  Procedure Laterality Date  . COLONOSCOPY  2008   benign polyps  . PROSTATE BIOPSY  2012   malignant  . REPLACEMENT TOTAL KNEE Bilateral     Home Medications:  Allergies as of 06/26/2017      Reactions   Ace Inhibitors Cough      Medication List       Accurate as of 06/26/17 12:51 PM. Always use your most recent med list.          aspirin 325 MG tablet Take by mouth.   ciprofloxacin 250 MG tablet Commonly known as:  CIPRO Take 1 tablet (250 mg total) by mouth 2 (two) times daily.   empagliflozin 10 MG Tabs tablet Commonly known as:  JARDIANCE Take 10 mg by mouth daily.   megestrol 20 MG tablet Commonly known as:  MEGACE Take 1 tablet by mouth daily.   metFORMIN 1000 MG tablet Commonly known  as:  GLUCOPHAGE TAKE 1 TABLET BY MOUTH TWICE DAILY   multivitamin with minerals tablet Take 1 tablet by mouth daily.   omeprazole 20 MG capsule Commonly known as:  PRILOSEC Take 1 capsule (20 mg total) by mouth 2 (two) times daily before a meal.       Allergies:  Allergies  Allergen Reactions  . Ace Inhibitors Cough    Family History: Family History  Problem Relation Age of Onset  . Diabetes Mother     Social History:  reports that he quit smoking about 42 years ago. He has never used smokeless tobacco. He reports that he drinks about 20.4 oz of alcohol per week . He reports that he does not use drugs.   Review of Systems  Gastrointestinal (upper)  : Negative for upper GI symptoms  Gastrointestinal (lower) : Negative for lower GI symptoms  Constitutional : Negative for symptoms  Skin: Negative for skin symptoms  Eyes: Negative for eye symptoms  Ear/Nose/Throat : Negative for Ear/Nose/Throat symptoms  Hematologic/Lymphatic: Negative for Hematologic/Lymphatic symptoms  Cardiovascular : Negative for cardiovascular symptoms  Respiratory : Negative for respiratory symptoms  Endocrine: Negative for endocrine symptoms  Musculoskeletal: Negative for musculoskeletal symptoms  Neurological: Negative for neurological symptoms  Psychologic: Negative for psychiatric symptoms   Physical Exam: There were no vitals taken for this visit.  Constitutional:  Alert and oriented, No acute distress.  HEENT: [place] AT, moist mucus membranes.  Trachea midline, no masses. Cardiovascular: No clubbing, cyanosis, or edema. Respiratory: Normal respiratory effort, no increased work of breathing. GI: Abdomen is soft, nontender, nondistended, no abdominal masses. Moderate obesity.  GU: No CVA tenderness. Phallus straight. No testes / scrotal masses. Scarrified prostate fossa.  Skin: No rashes, bruises or suspicious lesions. Lymph: No cervical or inguinal  adenopathy. Neurologic: Grossly intact, no focal deficits, moving all 4 extremities. Psychiatric: Normal mood and affect.  Laboratory Data: Lab Results  Component Value Date   WBC 8.8 08/07/2016   HGB 14.6 08/07/2016   HCT 42.3 08/07/2016   MCV 100 (H) 08/07/2016   PLT 231 08/07/2016    Lab Results  Component Value Date   CREATININE 0.90 06/13/2017    No results found for: PSA1  No results found for: TESTOSTERONE  Lab Results  Component Value Date   HGBA1C 6.2 (H) 06/13/2017    Urinalysis Lab Results  Component Value Date   SPECGRAV 1.010 06/13/2017   PHUR 5.0 06/13/2017   COLORU yellow 06/13/2017   APPEARANCEUR Hazy 08/22/2013   LEUKOCYTESUR Negative 06/13/2017   PROTEINUR neg 06/13/2017   GLUCOSEU Negative 08/22/2013   KETONESU neg 06/13/2017   RBCU 6 /HPF 08/22/2013   BILIRUBINUR neg 06/13/2017   NITRITE neg 06/13/2017    Lab Results  Component Value Date   LABMICR <3.0 06/13/2017   BACTERIA 2+ 08/22/2013    Pertinent Imaging: none  Assessment & Plan:    1. Urinary urgency / Lower Urinary Tract Symptoms - new symptoms started after Jardiance, this is known side effect of this med. It has helped his A1c and weight loss which he likes. Discussed options of observation, stop Jardiance, begin outlet meds. He opts for observaiton. He is reassured.    2. Prostate cancer-  Sounds like good biochemical contorl of likelky moerate risk disease by history. He wants [place] to take over suveillance as much closer than [place]. Rec annual PSA until 2025 at this point. He will be due 10/2016.  3 - Erectile Dysfunction - severe disease. Options frankly discussed including injection meds or surgery and he declines.   RTC 4 mos with PSA/T, then annually with PSA until [place], [place], [place] [address], [place],  [address] [phone]

## 2017-07-03 ENCOUNTER — Ambulatory Visit: Payer: Medicare Other

## 2017-07-04 ENCOUNTER — Encounter: Payer: Self-pay | Admitting: *Deleted

## 2017-07-05 ENCOUNTER — Encounter: Admission: RE | Payer: Self-pay | Source: Ambulatory Visit

## 2017-07-05 ENCOUNTER — Ambulatory Visit: Admission: RE | Admit: 2017-07-05 | Payer: Medicare Other | Source: Ambulatory Visit | Admitting: Gastroenterology

## 2017-07-05 HISTORY — DX: Unspecified osteoarthritis, unspecified site: M19.90

## 2017-07-05 HISTORY — DX: Malignant (primary) neoplasm, unspecified: C80.1

## 2017-07-05 HISTORY — DX: Sleep apnea, unspecified: G47.30

## 2017-07-05 HISTORY — DX: Juvenile osteochondrosis of tarsus, unspecified ankle: M92.60

## 2017-07-05 HISTORY — DX: Malignant neoplasm of prostate: C61

## 2017-07-05 SURGERY — COLONOSCOPY WITH PROPOFOL
Anesthesia: General

## 2017-08-07 ENCOUNTER — Ambulatory Visit: Payer: Medicare Other | Admitting: Anesthesiology

## 2017-08-07 ENCOUNTER — Encounter: Payer: Self-pay | Admitting: *Deleted

## 2017-08-07 ENCOUNTER — Encounter
Admission: RE | Disposition: A | Discharge status: Home / Self Care | Payer: Self-pay | Source: Ambulatory Visit | Attending: Gastroenterology

## 2017-08-07 ENCOUNTER — Ambulatory Visit
Admission: RE | Admit: 2017-08-07 | Discharge: 2017-08-07 | Disposition: A | Discharge status: Home / Self Care | Payer: Medicare Other | Source: Ambulatory Visit | Attending: Gastroenterology | Admitting: Gastroenterology

## 2017-08-07 DIAGNOSIS — K2970 Gastritis, unspecified, without bleeding: Secondary | ICD-10-CM | POA: Insufficient documentation

## 2017-08-07 DIAGNOSIS — Z8546 Personal history of malignant neoplasm of prostate: Secondary | ICD-10-CM | POA: Diagnosis not present

## 2017-08-07 DIAGNOSIS — E119 Type 2 diabetes mellitus without complications: Secondary | ICD-10-CM | POA: Diagnosis not present

## 2017-08-07 DIAGNOSIS — Z87891 Personal history of nicotine dependence: Secondary | ICD-10-CM | POA: Diagnosis not present

## 2017-08-07 DIAGNOSIS — Z86010 Personal history of colonic polyps: Secondary | ICD-10-CM | POA: Insufficient documentation

## 2017-08-07 DIAGNOSIS — K635 Polyp of colon: Secondary | ICD-10-CM | POA: Diagnosis not present

## 2017-08-07 DIAGNOSIS — K3189 Other diseases of stomach and duodenum: Secondary | ICD-10-CM | POA: Diagnosis not present

## 2017-08-07 DIAGNOSIS — Z888 Allergy status to other drugs, medicaments and biological substances status: Secondary | ICD-10-CM | POA: Diagnosis not present

## 2017-08-07 DIAGNOSIS — D125 Benign neoplasm of sigmoid colon: Secondary | ICD-10-CM | POA: Diagnosis not present

## 2017-08-07 DIAGNOSIS — K5520 Angiodysplasia of colon without hemorrhage: Secondary | ICD-10-CM | POA: Diagnosis not present

## 2017-08-07 DIAGNOSIS — K319 Disease of stomach and duodenum, unspecified: Secondary | ICD-10-CM | POA: Diagnosis not present

## 2017-08-07 DIAGNOSIS — K449 Diaphragmatic hernia without obstruction or gangrene: Secondary | ICD-10-CM | POA: Diagnosis not present

## 2017-08-07 DIAGNOSIS — K2950 Unspecified chronic gastritis without bleeding: Secondary | ICD-10-CM | POA: Diagnosis not present

## 2017-08-07 DIAGNOSIS — Z7982 Long term (current) use of aspirin: Secondary | ICD-10-CM | POA: Insufficient documentation

## 2017-08-07 DIAGNOSIS — I10 Essential (primary) hypertension: Secondary | ICD-10-CM | POA: Insufficient documentation

## 2017-08-07 DIAGNOSIS — K228 Other specified diseases of esophagus: Secondary | ICD-10-CM | POA: Insufficient documentation

## 2017-08-07 DIAGNOSIS — Z1211 Encounter for screening for malignant neoplasm of colon: Secondary | ICD-10-CM | POA: Diagnosis not present

## 2017-08-07 DIAGNOSIS — Z79899 Other long term (current) drug therapy: Secondary | ICD-10-CM | POA: Insufficient documentation

## 2017-08-07 DIAGNOSIS — D123 Benign neoplasm of transverse colon: Secondary | ICD-10-CM | POA: Diagnosis not present

## 2017-08-07 DIAGNOSIS — K5790 Diverticulosis of intestine, part unspecified, without perforation or abscess without bleeding: Secondary | ICD-10-CM | POA: Diagnosis not present

## 2017-08-07 DIAGNOSIS — K5730 Diverticulosis of large intestine without perforation or abscess without bleeding: Secondary | ICD-10-CM | POA: Insufficient documentation

## 2017-08-07 DIAGNOSIS — G4730 Sleep apnea, unspecified: Secondary | ICD-10-CM | POA: Diagnosis not present

## 2017-08-07 DIAGNOSIS — K210 Gastro-esophageal reflux disease with esophagitis: Secondary | ICD-10-CM | POA: Diagnosis not present

## 2017-08-07 DIAGNOSIS — R066 Hiccough: Secondary | ICD-10-CM | POA: Diagnosis present

## 2017-08-07 DIAGNOSIS — D120 Benign neoplasm of cecum: Secondary | ICD-10-CM | POA: Diagnosis not present

## 2017-08-07 DIAGNOSIS — Z7984 Long term (current) use of oral hypoglycemic drugs: Secondary | ICD-10-CM | POA: Insufficient documentation

## 2017-08-07 DIAGNOSIS — K640 First degree hemorrhoids: Secondary | ICD-10-CM | POA: Insufficient documentation

## 2017-08-07 DIAGNOSIS — R142 Eructation: Secondary | ICD-10-CM | POA: Diagnosis present

## 2017-08-07 HISTORY — PX: COLONOSCOPY WITH PROPOFOL: SHX5780

## 2017-08-07 HISTORY — PX: ESOPHAGOGASTRODUODENOSCOPY (EGD) WITH PROPOFOL: SHX5813

## 2017-08-07 HISTORY — DX: Personal history of urinary calculi: Z87.442

## 2017-08-07 LAB — GLUCOSE, CAPILLARY: GLUCOSE-CAPILLARY: 126 mg/dL — AB (ref 65–99)

## 2017-08-07 SURGERY — COLONOSCOPY WITH PROPOFOL
Anesthesia: General

## 2017-08-07 MED ORDER — PROPOFOL 500 MG/50ML IV EMUL
INTRAVENOUS | Status: DC | PRN
  Administered 2017-08-07: 140 ug/kg/min via INTRAVENOUS

## 2017-08-07 MED ORDER — PROPOFOL 500 MG/50ML IV EMUL
INTRAVENOUS | Status: AC
  Filled 2017-08-07: qty 100

## 2017-08-07 MED ORDER — SODIUM CHLORIDE 0.9 % IV SOLN: INTRAVENOUS | Status: DC

## 2017-08-07 MED ORDER — LIDOCAINE HCL (PF) 1 % IJ SOLN
2.0000 mL | Freq: Once | INTRAMUSCULAR | Status: AC
  Administered 2017-08-07: 0.3 mL via INTRADERMAL

## 2017-08-07 MED ORDER — PROPOFOL 10 MG/ML IV BOLUS
INTRAVENOUS | Status: DC | PRN
  Administered 2017-08-07: 30 mg via INTRAVENOUS
  Administered 2017-08-07: 90 mg via INTRAVENOUS
  Administered 2017-08-07 (×2): 30 mg via INTRAVENOUS

## 2017-08-07 MED ORDER — SODIUM CHLORIDE 0.9 % IV SOLN
INTRAVENOUS | Status: DC
  Administered 2017-08-07: 1000 mL via INTRAVENOUS

## 2017-08-07 MED ORDER — PROPOFOL 500 MG/50ML IV EMUL
INTRAVENOUS | Status: AC
  Filled 2017-08-07: qty 50

## 2017-08-07 MED ORDER — LIDOCAINE HCL (CARDIAC) 20 MG/ML IV SOLN
INTRAVENOUS | Status: DC | PRN
  Administered 2017-08-07: 50 mg via INTRAVENOUS

## 2017-08-07 MED ORDER — LIDOCAINE HCL (PF) 1 % IJ SOLN
INTRAMUSCULAR | Status: AC
  Administered 2017-08-07: 0.3 mL via INTRADERMAL
  Filled 2017-08-07: qty 2

## 2017-08-07 NOTE — Anesthesia Postprocedure Evaluation (Signed)
Anesthesia Post Note  Patient: Thomas Allen  Procedure(s) Performed: COLONOSCOPY WITH PROPOFOL (N/A ) ESOPHAGOGASTRODUODENOSCOPY (EGD) WITH PROPOFOL (N/A )  Patient location during evaluation: Endoscopy Anesthesia Type: General Level of consciousness: awake and alert Pain management: pain level controlled Vital Signs Assessment: post-procedure vital signs reviewed and stable Respiratory status: spontaneous breathing and respiratory function stable Cardiovascular status: stable Anesthetic complications: no     Last Vitals:  Vitals:   08/07/17 1035 08/07/17 1045  BP: (!) 84/57 129/73  Pulse: 80 78  Resp: 17 19  Temp:    SpO2: 96% 95%    Last Pain:  Vitals:   08/07/17 1025  TempSrc: Tympanic                 KEPHART,WILLIAM K

## 2017-08-07 NOTE — Anesthesia Post-op Follow-up Note (Signed)
Anesthesia QCDR form completed.        

## 2017-08-07 NOTE — Op Note (Signed)
[facility] Gastroenterology Patient Name: Thomas Allen Procedure Date: 08/07/2017 8:43 AM MRN: [id number] Account #: [id number] Date of Birth: 01-06-1941 Admit Type: Outpatient Age: 76 Room: [facility] ENDO ROOM 3 Gender: Male Note Status: Finalized Procedure:            Colonoscopy Indications:          Personal history of colonic polyps Providers:            Lollie Sails, MD Referring MD:         Halina Maidens, MD (Referring MD) Medicines:            Monitored Anesthesia Care Complications:        No immediate complications. Procedure:            Pre-Anesthesia Assessment:                       - ASA Grade Assessment: III - A patient with severe                        systemic disease.                       After obtaining informed consent, the colonoscope was                        passed under direct vision. Throughout the procedure,                        the patient's blood pressure, pulse, and oxygen                        saturations were monitored continuously. The                        Colonoscope was introduced through the anus and                        advanced to the the cecum, identified by appendiceal                        orifice and ileocecal valve. The quality of the bowel                        preparation was poor. Findings:      Many small and large-mouthed diverticula were found in the entire colon.      A 4 mm polyp was found in the distal sigmoid colon. The polyp was       sessile. The polyp was removed with a cold snare. Resection and       retrieval were complete.      A 4 mm polyp was found in the proximal sigmoid colon. The polyp was       sessile. The polyp was removed with a cold snare. Resection was       complete, but the polyp tissue was not retrieved in area of poor prep       multiple diverticuli and poor air retention. Adenoma in appearance. .      A 2 mm polyp was found in the cecum. The polyp was sessile. The polyp   was removed with a cold biopsy forceps. Resection and retrieval were  complete.      Two sessile polyps were found in the transverse colon. The polyps were 2       mm in size. These polyps were removed with a cold biopsy forceps.       Resection and retrieval were complete.      Non-bleeding internal hemorrhoids were found during anoscopy. The       hemorrhoids were small and Grade I (internal hemorrhoids that do not       prolapse).      The digital rectal exam was normal.      A few small localized angioectasias without bleeding were found in the       distal rectum. Impression:           - Preparation of the colon was poor.                       - Diverticulosis in the entire examined colon.                       - One 4 mm polyp in the distal sigmoid colon, removed                        with a cold snare. Resected and retrieved.                       - One 4 mm polyp in the proximal sigmoid colon, removed                        with a cold snare. Complete resection. Polyp tissue not                        retrieved.                       - One 2 mm polyp in the cecum, removed with a cold                        biopsy forceps. Resected and retrieved.                       - Two 2 mm polyps in the transverse colon, removed with                        a cold biopsy forceps. Resected and retrieved.                       - Non-bleeding internal hemorrhoids.                       - A few non-bleeding colonic angioectasias, consistant                        with history. Recommendation:       - Discharge patient to home.                       - Await pathology results.                       - Telephone GI clinic for pathology results in 1 week. Procedure Code(s):    ---  Professional ---                       [phone], Colonoscopy, flexible; with removal of tumor(s),                        polyp(s), or other lesion(s) by snare technique                       45380, 59, Colonoscopy,  flexible; with biopsy, single                        or multiple Diagnosis Code(s):    --- Professional ---                       K64.0, First degree hemorrhoids                       D12.5, Benign neoplasm of sigmoid colon                       D12.0, Benign neoplasm of cecum                       D12.3, Benign neoplasm of transverse colon (hepatic                        flexure or splenic flexure)                       K55.20, Angiodysplasia of colon without hemorrhage                       Z86.010, Personal history of colonic polyps                       K57.30, Diverticulosis of large intestine without                        perforation or abscess without bleeding CPT copyright 2016 American Medical Association. All rights reserved. The codes documented in this report are preliminary and upon coder review may  be revised to meet current compliance requirements. Lollie Sails, MD 08/07/2017 10:29:36 AM This report has been signed electronically. Number of Addenda: 0 Note Initiated On: 08/07/2017 8:43 AM Scope Withdrawal Time: 0 hours 23 minutes 6 seconds  Total Procedure Duration: 0 hours 58 minutes 46 seconds       [facility]

## 2017-08-07 NOTE — Transfer of Care (Signed)
Immediate Anesthesia Transfer of Care Note  Patient: Thomas Allen  Procedure(s) Performed: COLONOSCOPY WITH PROPOFOL (N/A ) ESOPHAGOGASTRODUODENOSCOPY (EGD) WITH PROPOFOL (N/A )  Patient Location: PACU  Anesthesia Type:General  Level of Consciousness: drowsy  Airway & Oxygen Therapy: Patient Spontanous Breathing and Patient connected to nasal cannula oxygen  Post-op Assessment: Report given to RN and Post -op Vital signs reviewed and stable  Post vital signs: Reviewed and stable  Last Vitals:  Vitals:   08/07/17 0822 08/07/17 1025  BP: 131/74 119/69  Pulse: 82 78  Resp: 17 14  Temp: (!) 35.8 C (!) 36.2 C  SpO2: 99% 95%    Last Pain:  Vitals:   08/07/17 1025  TempSrc: Tympanic         Complications: No apparent anesthesia complications

## 2017-08-07 NOTE — Anesthesia Preprocedure Evaluation (Signed)
Anesthesia Evaluation  Patient identified by MRN, date of birth, ID band Patient awake    Reviewed: Allergy & Precautions, NPO status , Patient's Chart, lab work & pertinent test results  History of Anesthesia Complications Negative for: history of anesthetic complications  Airway Mallampati: III       Dental   Pulmonary sleep apnea (not using CPAP) , neg COPD, former smoker,           Cardiovascular hypertension, Pt. on medications (-) Past MI and (-) CHF (-) dysrhythmias (-) Valvular Problems/Murmurs     Neuro/Psych neg Seizures    GI/Hepatic Neg liver ROS, GERD  Medicated and Controlled,  Endo/Other  diabetes, Type 2, Oral Hypoglycemic Agents  Renal/GU negative Renal ROS     Musculoskeletal   Abdominal   Peds  Hematology   Anesthesia Other Findings   Reproductive/Obstetrics                             Anesthesia Physical Anesthesia Plan  ASA: III  Anesthesia Plan: General   Post-op Pain Management:    Induction: Intravenous  PONV Risk Score and Plan: 2 and Propofol infusion and TIVA  Airway Management Planned: Nasal Cannula  Additional Equipment:   Intra-op Plan:   Post-operative Plan:   Informed Consent: I have reviewed the patients History and Physical, chart, labs and discussed the procedure including the risks, benefits and alternatives for the proposed anesthesia with the patient or authorized representative who has indicated his/her understanding and acceptance.     Plan Discussed with:   Anesthesia Plan Comments:         Anesthesia Quick Evaluation

## 2017-08-07 NOTE — Op Note (Signed)
[facility] Gastroenterology Patient Name: Thomas Allen Procedure Date: 08/07/2017 8:43 AM MRN: [id number] Account #: [id number] Date of Birth: 07/13/1941 Admit Type: Outpatient Age: 76 Room: [facility] ENDO ROOM 3 Gender: Male Note Status: Finalized Procedure:            Upper GI endoscopy Indications:          Dyspepsia, , hiccoughs, belching Providers:            Lollie Sails, MD Referring MD:         Halina Maidens, MD (Referring MD) Medicines:            Monitored Anesthesia Care Complications:        No immediate complications. Procedure:            Pre-Anesthesia Assessment:                       - ASA Grade Assessment: III - A patient with severe                        systemic disease.                       After obtaining informed consent, the endoscope was                        passed under direct vision. Throughout the procedure,                        the patient's blood pressure, pulse, and oxygen                        saturations were monitored continuously. The Endoscope                        was introduced through the mouth, and advanced to the                        third part of duodenum. The upper GI endoscopy was                        accomplished without difficulty. The patient tolerated                        the procedure well. Findings:      The Z-line was variable. Biopsies were taken with a cold forceps for       histology.      A small hiatal hernia was present.      Patchy moderate inflammation characterized by congestion (edema) and       erythema was found in the gastric body and in the gastric antrum.       Biopsies were taken with a cold forceps for histology.      The cardia and gastric fundus were normal on retroflexion.      The examined duodenum was normal. Impression:           - Z-line variable. Biopsied.                       - Small hiatal hernia.                       -  Gastritis. Biopsied.                       -  Normal examined duodenum. Recommendation:       - Perform a colonoscopy today.                       - Await pathology results.                       - Continue present medications. Procedure Code(s):    --- Professional ---                       [phone], Esophagogastroduodenoscopy, flexible, transoral;                        with biopsy, single or multiple Diagnosis Code(s):    --- Professional ---                       K22.8, Other specified diseases of esophagus                       K44.9, Diaphragmatic hernia without obstruction or                        gangrene                       K29.70, Gastritis, unspecified, without bleeding                       R10.13, Epigastric pain CPT copyright 2016 American Medical Association. All rights reserved. The codes documented in this report are preliminary and upon coder review may  be revised to meet current compliance requirements. Lollie Sails, MD 08/07/2017 9:13:36 AM This report has been signed electronically. Number of Addenda: 0 Note Initiated On: 08/07/2017 8:43 AM      [facility]

## 2017-08-07 NOTE — H&P (Signed)
Outpatient short stay form Pre-procedure 08/07/2017 8:47 AM Thomas Sails MD  Primary Physician: Dr. Halina Maidens  Reason for visit:  EGD and colonoscopy   History of present illness:  Patietn is a 76 yo  Male presenting as above. He ahs a personal history of colon polyps, adenomas and some recent issues with GERD and belching.  This has improved with PPI use.  Hiccoughs have also improved.  He tolerated his prep well and takes no ASA or blood thinners.     Current Facility-Administered Medications:  .  0.9 %  sodium chloride infusion, , Intravenous, Continuous, Thomas Sails, MD, Last Rate: 20 mL/hr at 08/07/17 0843, 1,000 mL at 08/07/17 0843 .  0.9 %  sodium chloride infusion, , Intravenous, Continuous, Thomas Sails, MD  Medications Prior to Admission  Medication Sig Dispense Refill Last Dose  . lisinopril (PRINIVIL,ZESTRIL) 5 MG tablet Take 5 mg by mouth daily.     . ranitidine (ZANTAC) 300 MG tablet Take 300 mg by mouth at bedtime.     Marland Kitchen aspirin 325 MG tablet Take by mouth.   07/31/2017  . empagliflozin (JARDIANCE) 10 MG TABS tablet Take 10 mg by mouth daily. 30 tablet 5 Taking  . Garlic 622 MG TABS Take by mouth.   Taking  . megestrol (MEGACE) 20 MG tablet Take 1 tablet by mouth daily.   Taking  . metFORMIN (GLUCOPHAGE) 1000 MG tablet TAKE 1 TABLET BY MOUTH TWICE DAILY 180 tablet 1 Taking  . Multiple Vitamins-Minerals (MULTIVITAMIN WITH MINERALS) tablet Take 1 tablet by mouth daily.   Taking  . omeprazole (PRILOSEC) 20 MG capsule Take 1 capsule (20 mg total) by mouth 2 (two) times daily before a meal. 60 capsule 12 Taking     Allergies  Allergen Reactions  . Ace Inhibitors Cough     Past Medical History:  Diagnosis Date  . Arthritis   . Cancer ([place])   . Diabetes mellitus without complication ([place])   . GERD (gastroesophageal reflux disease)   . Haglund's deformity   . History of kidney stones   . Hypertension   . Prostate CA ([place])   . Sleep apnea      Review of systems:      Physical Exam    Heart and lungs: Regular rate and rhythm without rub or gallop, lungs are bilaterally clear.    HEENT: Normocephalic atraumatic eyes are anicteric    Other:     Pertinant exam for procedure: Soft nontender nondistended bowel sounds positive normoactive.    Planned proceedures: EGD, colonoscopy and indicated procedures. I have discussed the risks benefits and complications of procedures to include not limited to bleeding, infection, perforation and the risk of sedation and the patient wishes to proceed.    Thomas Sails, MD Gastroenterology 08/07/2017  8:47 AM

## 2017-08-08 ENCOUNTER — Encounter: Payer: Medicare Other | Admitting: Internal Medicine

## 2017-08-08 ENCOUNTER — Encounter: Payer: Self-pay | Admitting: Gastroenterology

## 2017-08-08 LAB — SURGICAL PATHOLOGY

## 2017-08-13 ENCOUNTER — Ambulatory Visit: Payer: PRIVATE HEALTH INSURANCE

## 2017-08-20 ENCOUNTER — Ambulatory Visit: Payer: Medicare Other

## 2017-08-23 ENCOUNTER — Encounter: Payer: Self-pay | Admitting: Internal Medicine

## 2017-08-23 ENCOUNTER — Ambulatory Visit (INDEPENDENT_AMBULATORY_CARE_PROVIDER_SITE_OTHER): Payer: Medicare Other | Admitting: Internal Medicine

## 2017-08-23 VITALS — BP 124/70 | HR 64 | Ht 68.0 in | Wt 224.0 lb

## 2017-08-23 DIAGNOSIS — E119 Type 2 diabetes mellitus without complications: Secondary | ICD-10-CM | POA: Diagnosis not present

## 2017-08-23 DIAGNOSIS — M79641 Pain in right hand: Secondary | ICD-10-CM | POA: Diagnosis not present

## 2017-08-23 DIAGNOSIS — E1169 Type 2 diabetes mellitus with other specified complication: Secondary | ICD-10-CM

## 2017-08-23 DIAGNOSIS — Z0001 Encounter for general adult medical examination with abnormal findings: Secondary | ICD-10-CM

## 2017-08-23 DIAGNOSIS — E785 Hyperlipidemia, unspecified: Secondary | ICD-10-CM

## 2017-08-23 DIAGNOSIS — F1020 Alcohol dependence, uncomplicated: Secondary | ICD-10-CM

## 2017-08-23 DIAGNOSIS — Z86010 Personal history of colonic polyps: Secondary | ICD-10-CM

## 2017-08-23 DIAGNOSIS — K219 Gastro-esophageal reflux disease without esophagitis: Secondary | ICD-10-CM | POA: Diagnosis not present

## 2017-08-23 DIAGNOSIS — C61 Malignant neoplasm of prostate: Secondary | ICD-10-CM

## 2017-08-23 DIAGNOSIS — Z0000 Encounter for general adult medical examination without abnormal findings: Secondary | ICD-10-CM

## 2017-08-23 MED ORDER — LOSARTAN POTASSIUM 25 MG PO TABS: 25.0000 mg | ORAL_TABLET | Freq: Every day | ORAL | 5 refills | Status: DC

## 2017-08-23 NOTE — Progress Notes (Signed)
Patient: Thomas Allen, Male    DOB: 12-06-1940, 76 y.o.   MRN: [id number] Visit Date: 08/23/2017  Today's Provider: Halina Maidens, MD   Chief Complaint  Patient presents with  . Medicare Wellness   Subjective:    Annual wellness visit Thomas Allen is a 76 y.o. male who presents today for his Subsequent Annual Wellness Visit. He feels fairly well. He reports exercising none. He reports he is sleeping well.  He consumes 2 drinks of liquor per day and no more.  He declines all vaccines. ----------------------------------------------------------- Diabetes  Pertinent negatives for hypoglycemia include no dizziness or headaches. Pertinent negatives for diabetes include no chest pain, no fatigue, no polydipsia and no polyuria. He participates in exercise three times a week. He monitors blood glucose at home 3-4 x per week. His breakfast blood glucose is taken between 8-9 am. His breakfast blood glucose range is generally 110-130 mg/dl. An ACE inhibitor/angiotensin II receptor blocker is not being taken.  Hand Pain   There was no injury mechanism. The pain is present in the right hand. The quality of the pain is described as burning (numbness and pain). The pain is mild. The pain has been worsening since the incident. Pertinent negatives include no chest pain.  Gastroesophageal Reflux  He complains of heartburn. He reports no abdominal pain, no chest pain, no choking or no wheezing. The problem occurs rarely. Pertinent negatives include no fatigue. He has tried a PPI for the symptoms. The treatment provided significant relief.    Review of Systems  Constitutional: Negative for appetite change, chills, diaphoresis, fatigue and unexpected weight change.  HENT: Negative for hearing loss, tinnitus, trouble swallowing and voice change.   Eyes: Negative for visual disturbance.  Respiratory: Negative for choking, shortness of breath and wheezing.   Cardiovascular: Negative for chest pain,  palpitations and leg swelling.  Gastrointestinal: Positive for heartburn. Negative for abdominal pain, blood in stool, constipation and diarrhea.  Endocrine: Negative for polydipsia and polyuria.  Genitourinary: Positive for frequency. Negative for difficulty urinating and dysuria.  Musculoskeletal: Negative for arthralgias, back pain and myalgias.  Skin: Negative for color change and rash.  Neurological: Negative for dizziness, syncope and headaches.  Hematological: Negative for adenopathy.  Psychiatric/Behavioral: Negative for dysphoric mood and sleep disturbance.    Social History   Socioeconomic History  . Marital status: Married    Spouse name: Not on file  . Number of children: Not on file  . Years of education: Not on file  . Highest education level: Not on file  Social Needs  . Financial resource strain: Not on file  . Food insecurity - worry: Not on file  . Food insecurity - inability: Not on file  . Transportation needs - medical: Not on file  . Transportation needs - non-medical: Not on file  Occupational History  . Not on file  Tobacco Use  . Smoking status: Former Smoker    Last attempt to quit: 1976    Years since quitting: 42.9  . Smokeless tobacco: Never Used  Substance and Sexual Activity  . Alcohol use: Yes    Alcohol/week: 20.4 oz    Types: 20 Standard drinks or equivalent, 14 Shots of liquor per week  . Drug use: No  . Sexual activity: Not on file  Other Topics Concern  . Not on file  Social History Narrative  . Not on file    Patient Active Problem List   Diagnosis Date Noted  . Urinary urgency 06/26/2017  .  Prostate cancer screening 06/26/2017  . Prostate cancer ([place]) 06/26/2017  . Erectile dysfunction following radiation therapy 06/26/2017  . Urinary frequency 06/18/2017  . Hyperlipidemia associated with type 2 diabetes mellitus ([place]) 03/12/2017  . H. pylori infection 02/06/2017  . Asymptomatic PVCs 02/05/2017  . Intractable hiccups  02/05/2017  . Gastroesophageal reflux disease 02/05/2017  . Alcohol use disorder 01/19/2016  . Type 2 diabetes mellitus without complication, without long-term current use of insulin ([place]) 07/26/2015  . Carpal tunnel syndrome 03/30/2015  . Elevated blood pressure, situational 03/30/2015  . Abnormal LFTs 03/30/2015  . Failure of erection 03/30/2015  . H/O adenomatous polyp of colon 03/30/2015  . Tobacco use disorder, moderate, in sustained remission 03/30/2015  . History of prostate cancer 09/02/2013  . Posterior calcaneal exostosis 02/26/2013  . Calcific Achilles tendinitis 02/26/2013    Past Surgical History:  Procedure Laterality Date  . APPENDECTOMY    . COLONOSCOPY  2008   benign polyps  . COLONOSCOPY WITH PROPOFOL N/A 08/07/2017   Procedure: COLONOSCOPY WITH PROPOFOL;  Surgeon: Lollie Sails, MD;  Location[facility] ENDOSCOPY;  Service: Endoscopy;  Laterality: N/A;  . ESOPHAGOGASTRODUODENOSCOPY (EGD) WITH PROPOFOL N/A 08/07/2017   Procedure: ESOPHAGOGASTRODUODENOSCOPY (EGD) WITH PROPOFOL;  Surgeon: Lollie Sails, MD;  Location: [facility] ENDOSCOPY;  Service: Endoscopy;  Laterality: N/A;  . JOINT REPLACEMENT Right 2009  . JOINT REPLACEMENT Left 2015  . lithopexy    . PROSTATE BIOPSY  2012   malignant  . REPLACEMENT TOTAL KNEE Bilateral     His family history includes Colon cancer in his father and mother; Diabetes in his mother.     Current Meds  Medication Sig  . aspirin 325 MG tablet Take by mouth.  . empagliflozin (JARDIANCE) 10 MG TABS tablet Take 10 mg by mouth daily.  . Garlic 767 MG TABS Take by mouth.  . metFORMIN (GLUCOPHAGE) 1000 MG tablet TAKE 1 TABLET BY MOUTH TWICE DAILY  . Multiple Vitamins-Minerals (MULTIVITAMIN WITH MINERALS) tablet Take 1 tablet by mouth daily.  Marland Kitchen omeprazole (PRILOSEC) 20 MG capsule Take 1 capsule (20 mg total) by mouth 2 (two) times daily before a meal.    Patient Care Team: Glean Hess, MD as PCP - General (Internal  Medicine) Lollie Sails, MD as Consulting Physician (Gastroenterology)       Objective:   Vitals: BP 124/70   Pulse 64   Ht '5\' 8"'$  (1.727 m)   Wt 224 lb (101.6 kg)   SpO2 98%   BMI 34.06 kg/m   Physical Exam  Constitutional: He is oriented to person, place, and time. He appears well-developed and well-nourished.  HENT:  Head: Normocephalic.  Right Ear: Tympanic membrane, external ear and ear canal normal.  Left Ear: Tympanic membrane, external ear and ear canal normal.  Nose: Nose normal.  Mouth/Throat: Uvula is midline and oropharynx is clear and moist.  Eyes: Conjunctivae and EOM are normal. Pupils are equal, round, and reactive to light.  Neck: Normal range of motion. Neck supple. Carotid bruit is not present. No thyromegaly present.  Cardiovascular: Normal rate, regular rhythm, normal heart sounds and intact distal pulses.  Pulmonary/Chest: Effort normal and breath sounds normal. He has no wheezes. Right breast exhibits no mass. Left breast exhibits no mass.  Abdominal: Soft. Normal appearance and bowel sounds are normal. There is no hepatosplenomegaly. There is no tenderness.  Musculoskeletal: Normal range of motion. He exhibits no edema or tenderness.  Tenderness and limited flexion of right middle finger Mild decrease in light  touch noted   Lymphadenopathy:    He has no cervical adenopathy.  Neurological: He is alert and oriented to person, place, and time. He has normal reflexes.  Skin: Skin is warm, dry and intact.  Psychiatric: He has a normal mood and affect. His speech is normal and behavior is normal. Judgment and thought content normal.  Nursing note and vitals reviewed.   Activities of Daily Living In your present state of health, do you have any difficulty performing the following activities: 08/23/2017 08/23/2017  Hearing? N N  Vision? N N  Difficulty concentrating or making decisions? N N  Walking or climbing stairs? N N  Dressing or bathing? N N   Doing errands, shopping? N N  Preparing Food and eating ? N -  Using the Toilet? N -  In the past six months, have you accidently leaked urine? N -  Do you have problems with loss of bowel control? N -  Managing your Medications? N -  Managing your Finances? N -  Some recent data might be hidden    Fall Risk Assessment Fall Risk  08/23/2017 08/23/2017 08/07/2016 01/19/2016 07/26/2015  Falls in the past year? No No No No No     Depression Screen PHQ 2/9 Scores 08/23/2017 08/23/2017 08/07/2016 01/19/2016  PHQ - 2 Score 0 0 0 0    6CIT Screen 08/23/2017 08/07/2016  What Year? 0 points 0 points  What month? 0 points 0 points  What time? 0 points 0 points  Count back from 20 0 points 0 points  Months in reverse 0 points 0 points  Repeat phrase 4 points 0 points  Total Score 4 0    Medicare Annual Wellness Visit Summary:  Reviewed patient's Family Medical History Reviewed and updated list of patient's medical providers Assessment of cognitive impairment was done Assessed patient's functional ability Established a written schedule for health screening [place] Completed and Reviewed  Exercise Activities and Dietary recommendations Goals    None       There is no immunization history on file for this patient.  Health Maintenance  Topic Date Due  . FOOT EXAM  08/07/2017  . INFLUENZA VACCINE  09/05/2023 (Originally 04/04/2017)  . TETANUS/TDAP  09/05/2023 (Originally 11/21/1960)  . PNA vac Low Risk Adult (1 of 2 - PCV13) 09/05/2023 (Originally 11/22/2006)  . HEMOGLOBIN A1C  12/12/2017  . OPHTHALMOLOGY EXAM  02/13/2018  . COLONOSCOPY  08/08/2027    Discussed health benefits of physical activity, and encouraged him to engage in regular exercise appropriate for his age and condition.    ------------------------------------------------------------------------------------------------------------  Assessment & Plan:   1. Medicare annual wellness visit,  subsequent MAW measures met - POCT urinalysis dipstick  2. Type 2 diabetes mellitus without complication, without long-term current use of insulin (HCC) Continue oral agents Add low dose losartan - losartan (COZAAR) 25 MG tablet; Take 1 tablet (25 mg total) by mouth daily.  Dispense: 30 tablet; Refill: 5 - Comprehensive metabolic panel - Hemoglobin A1c  3. Hyperlipidemia associated with type 2 diabetes mellitus ([place]) Will discuss statin next visit - Lipid panel  4. Gastroesophageal reflux disease, esophagitis presence not specified Controlled on PPI EGD showed reflux esophagitis without dysplasia - CBC with Differential/Platelet  5. Alcohol dependence, daily use (HCC) Stable, moderate daily use  6. H/O adenomatous polyp of colon Recent colonoscopy done - will repeat in 3-5 years  7. Prostate cancer ([place]) Followed by [facility] - PSA  8. Hand pain, right - Ambulatory  referral to Orthopedic Surgery   Meds ordered this encounter  Medications  . losartan (COZAAR) 25 MG tablet    Sig: Take 1 tablet (25 mg total) by mouth daily.    Dispense:  30 tablet    Refill:  5    Partially dictated using Editor, commissioning. Any errors are unintentional.  Halina Maidens, MD [facility]  08/23/2017

## 2017-08-24 LAB — COMPREHENSIVE METABOLIC PANEL
ALBUMIN: 4.6 g/dL (ref 3.5–4.8)
ALT: 26 IU/L (ref 0–44)
AST: 19 IU/L (ref 0–40)
Albumin/Globulin Ratio: 2.3 — ABNORMAL HIGH (ref 1.2–2.2)
Alkaline Phosphatase: 62 IU/L (ref 39–117)
BUN / CREAT RATIO: 16 (ref 10–24)
BUN: 13 mg/dL (ref 8–27)
Bilirubin Total: 0.4 mg/dL (ref 0.0–1.2)
CALCIUM: 9.8 mg/dL (ref 8.6–10.2)
CO2: 24 mmol/L (ref 20–29)
CREATININE: 0.83 mg/dL (ref 0.76–1.27)
Chloride: 99 mmol/L (ref 96–106)
GFR calc non Af Amer: 86 mL/min/{1.73_m2} (ref >59)
GFR, EST AFRICAN AMERICAN: 100 mL/min/{1.73_m2} (ref >59)
GLUCOSE: 105 mg/dL — AB (ref 65–99)
Globulin, Total: 2 g/dL (ref 1.5–4.5)
Potassium: 4.7 mmol/L (ref 3.5–5.2)
Sodium: 140 mmol/L (ref 134–144)
TOTAL PROTEIN: 6.6 g/dL (ref 6.0–8.5)

## 2017-08-24 LAB — CBC WITH DIFFERENTIAL/PLATELET
BASOS ABS: 0 10*3/uL (ref 0.0–0.2)
Basos: 0 %
EOS (ABSOLUTE): 0.3 10*3/uL (ref 0.0–0.4)
Eos: 3 %
HEMOGLOBIN: 15.3 g/dL (ref 13.0–17.7)
Hematocrit: 47.3 % (ref 37.5–51.0)
IMMATURE GRANS (ABS): 0 10*3/uL (ref 0.0–0.1)
IMMATURE GRANULOCYTES: 0 %
LYMPHS: 23 %
Lymphocytes Absolute: 2 10*3/uL (ref 0.7–3.1)
MCH: 33.8 pg — AB (ref 26.6–33.0)
MCHC: 32.3 g/dL (ref 31.5–35.7)
MCV: 105 fL — ABNORMAL HIGH (ref 79–97)
MONOCYTES: 9 %
Monocytes Absolute: 0.7 10*3/uL (ref 0.1–0.9)
NEUTROS ABS: 5.5 10*3/uL (ref 1.4–7.0)
NEUTROS PCT: 65 %
Platelets: 222 10*3/uL (ref 150–379)
RBC: 4.52 x10E6/uL (ref 4.14–5.80)
RDW: 12.9 % (ref 12.3–15.4)
WBC: 8.5 10*3/uL (ref 3.4–10.8)

## 2017-08-24 LAB — PSA: Prostate Specific Ag, Serum: 0.6 ng/mL (ref 0.0–4.0)

## 2017-08-24 LAB — LIPID PANEL
CHOL/HDL RATIO: 3.7 ratio (ref 0.0–5.0)
CHOLESTEROL TOTAL: 162 mg/dL (ref 100–199)
HDL: 44 mg/dL (ref >39)
LDL CALC: 92 mg/dL (ref 0–99)
TRIGLYCERIDES: 128 mg/dL (ref 0–149)
VLDL Cholesterol Cal: 26 mg/dL (ref 5–40)

## 2017-08-24 LAB — HEMOGLOBIN A1C
Est. average glucose Bld gHb Est-mCnc: 120 mg/dL
Hgb A1c MFr Bld: 5.8 % — ABNORMAL HIGH (ref 4.8–5.6)

## 2017-09-06 DIAGNOSIS — G5601 Carpal tunnel syndrome, right upper limb: Secondary | ICD-10-CM | POA: Diagnosis not present

## 2017-09-06 DIAGNOSIS — M65341 Trigger finger, right ring finger: Secondary | ICD-10-CM | POA: Diagnosis not present

## 2017-09-21 ENCOUNTER — Telehealth: Payer: Self-pay

## 2017-09-21 NOTE — Telephone Encounter (Signed)
Patient called stating he was supposed to be beginning new BP medication. [facility] never received it. Informed him we sent in Los[facility] to [facility] in December and they do have it. He stated he is already taking that medication currently. Thought another medication was being sent in too. Informed him Dr Army Melia wanted to start him on cholesterol medication but NOT at the same time as his new BP meds. She will discuss cholesterol meds at his next visit as long as he does well on new BP medication. He verbalized understanding after saying "I must of got mixed up.'' Informed him we will see him at his next appt.

## 2017-10-29 ENCOUNTER — Ambulatory Visit: Payer: Medicare Other

## 2017-10-29 ENCOUNTER — Ambulatory Visit: Payer: PRIVATE HEALTH INSURANCE

## 2017-11-01 DIAGNOSIS — M65341 Trigger finger, right ring finger: Secondary | ICD-10-CM | POA: Diagnosis not present

## 2017-11-01 DIAGNOSIS — G5601 Carpal tunnel syndrome, right upper limb: Secondary | ICD-10-CM | POA: Diagnosis not present

## 2017-11-27 DIAGNOSIS — C61 Malignant neoplasm of prostate: Secondary | ICD-10-CM | POA: Diagnosis not present

## 2017-12-23 ENCOUNTER — Encounter: Payer: Self-pay | Admitting: Internal Medicine

## 2017-12-24 ENCOUNTER — Ambulatory Visit (INDEPENDENT_AMBULATORY_CARE_PROVIDER_SITE_OTHER): Payer: Medicare Other | Admitting: Internal Medicine

## 2017-12-24 ENCOUNTER — Encounter: Payer: Self-pay | Admitting: Internal Medicine

## 2017-12-24 VITALS — BP 138/82 | HR 86 | Ht 68.0 in | Wt 227.0 lb

## 2017-12-24 DIAGNOSIS — E119 Type 2 diabetes mellitus without complications: Secondary | ICD-10-CM

## 2017-12-24 DIAGNOSIS — E1169 Type 2 diabetes mellitus with other specified complication: Secondary | ICD-10-CM | POA: Diagnosis not present

## 2017-12-24 DIAGNOSIS — R030 Elevated blood-pressure reading, without diagnosis of hypertension: Secondary | ICD-10-CM

## 2017-12-24 DIAGNOSIS — G5603 Carpal tunnel syndrome, bilateral upper limbs: Secondary | ICD-10-CM | POA: Diagnosis not present

## 2017-12-24 DIAGNOSIS — E785 Hyperlipidemia, unspecified: Secondary | ICD-10-CM

## 2017-12-24 MED ORDER — PRAVASTATIN SODIUM 20 MG PO TABS: 20.0000 mg | ORAL_TABLET | Freq: Every day | ORAL | 1 refills | Status: DC

## 2017-12-24 MED ORDER — EMPAGLIFLOZIN 10 MG PO TABS: 10.0000 mg | ORAL_TABLET | Freq: Every day | ORAL | 1 refills | Status: DC

## 2017-12-24 MED ORDER — METFORMIN HCL 1000 MG PO TABS: 1000.0000 mg | ORAL_TABLET | Freq: Two times a day (BID) | ORAL | 1 refills | Status: DC

## 2017-12-24 MED ORDER — LOSARTAN POTASSIUM 25 MG PO TABS: 25.0000 mg | ORAL_TABLET | Freq: Every day | ORAL | 1 refills | Status: DC

## 2017-12-24 NOTE — Progress Notes (Signed)
Date:  12/24/2017   Name:  Thomas Allen   DOB:  [birth year]   MRN:  [id number]   Chief Complaint: Diabetes and Hypertension Diabetes  He presents for his follow-up diabetic visit. He has type 2 diabetes mellitus. His disease course has been stable. Pertinent negatives for hypoglycemia include no headaches or tremors. Pertinent negatives for diabetes include no chest pain, no fatigue, no polydipsia and no polyuria. Symptoms are stable. Current diabetic treatment includes oral agent (monotherapy). He is compliant with treatment most of the time. He monitors blood glucose at home 1-2 x per week. His breakfast blood glucose is taken between 8-9 am. His breakfast blood glucose range is generally 130-140 mg/dl. An ACE inhibitor/angiotensin II receptor blocker is being taken.  Hypertension  This is a chronic problem. The problem is controlled. Pertinent negatives include no chest pain, headaches, palpitations or shortness of breath. Past treatments include angiotensin blockers. The current treatment provides significant improvement.  Hyperlipidemia  This is a chronic problem. The problem is uncontrolled (LDL> 70). Pertinent negatives include no chest pain or shortness of breath. He is currently on no antihyperlipidemic treatment.   CTS - on the right, becoming more problematic.  He was seen by Ortho and had trigger finger injection but did not address wrist.  He is awakened at night with numbness and pain, no weakness noted.   Review of Systems  Constitutional: Negative for appetite change, fatigue and unexpected weight change.  Eyes: Negative for visual disturbance.  Respiratory: Negative for cough, shortness of breath and wheezing.   Cardiovascular: Negative for chest pain, palpitations and leg swelling.  Gastrointestinal: Negative for abdominal pain and blood in stool.  Endocrine: Negative for polydipsia and polyuria.  Genitourinary: Negative for dysuria and hematuria.  Musculoskeletal:  Positive for arthralgias.  Skin: Negative for color change and rash.  Neurological: Negative for tremors, numbness and headaches.  Psychiatric/Behavioral: Negative for dysphoric mood.    Patient Active Problem List   Diagnosis Date Noted  . Hand pain, right 08/23/2017  . Erectile dysfunction following radiation therapy 06/26/2017  . Hyperlipidemia associated with type 2 diabetes mellitus ([place]) 03/12/2017  . Asymptomatic PVCs 02/05/2017  . Intractable hiccups 02/05/2017  . Gastroesophageal reflux disease 02/05/2017  . Alcohol use disorder 01/19/2016  . Type 2 diabetes mellitus without complication, without long-term current use of insulin ([place]) 07/26/2015  . Carpal tunnel syndrome 03/30/2015  . Elevated blood pressure, situational 03/30/2015  . Abnormal LFTs 03/30/2015  . H/O adenomatous polyp of colon 03/30/2015  . Tobacco use disorder, moderate, in sustained remission 03/30/2015  . History of prostate cancer 09/02/2013  . Posterior calcaneal exostosis 02/26/2013  . Calcific Achilles tendinitis 02/26/2013    Prior to Admission medications   Medication Sig Start Date End Date Taking? Authorizing Provider  aspirin 325 MG tablet Take by mouth.   Yes [provider]  empagliflozin (JARDIANCE) 10 MG TABS tablet Take 10 mg by mouth daily. 06/04/17  Yes Glean Hess, MD  Garlic 628 MG TABS Take by mouth.   Yes [provider]  losartan (COZAAR) 25 MG tablet Take 1 tablet (25 mg total) by mouth daily. 08/23/17  Yes Glean Hess, MD  metFORMIN (GLUCOPHAGE) 1000 MG tablet TAKE 1 TABLET BY MOUTH TWICE DAILY 06/02/17  Yes Glean Hess, MD  Multiple Vitamins-Minerals (MULTIVITAMIN WITH MINERALS) tablet Take 1 tablet by mouth daily.   Yes [provider]  omeprazole (PRILOSEC) 20 MG capsule Take 1 capsule (20 mg total) by  mouth 2 (two) times daily before a meal. 06/13/17  Yes Glean Hess, MD    Allergies  Allergen Reactions  . Ace Inhibitors  Cough    Past Surgical History:  Procedure Laterality Date  . APPENDECTOMY    . COLONOSCOPY  2008   benign polyps  . COLONOSCOPY WITH PROPOFOL N/A 08/07/2017   Procedure: COLONOSCOPY WITH PROPOFOL;  Surgeon: Lollie Sails, MD;  Location: [facility] ENDOSCOPY;  Service: Endoscopy;  Laterality: N/A;  . ESOPHAGOGASTRODUODENOSCOPY (EGD) WITH PROPOFOL N/A 08/07/2017   Procedure: ESOPHAGOGASTRODUODENOSCOPY (EGD) WITH PROPOFOL;  Surgeon: Lollie Sails, MD;  Location: [facility] ENDOSCOPY;  Service: Endoscopy;  Laterality: N/A;  . JOINT REPLACEMENT Right 2009  . JOINT REPLACEMENT Left 2015  . lithopexy    . PROSTATE BIOPSY  2012   malignant  . REPLACEMENT TOTAL KNEE Bilateral     Social History   Tobacco Use  . Smoking status: Former Smoker    Last attempt to quit: 1976    Years since quitting: 43.3  . Smokeless tobacco: Never Used  Substance Use Topics  . Alcohol use: Yes    Alcohol/week: 20.4 oz    Types: 20 Standard drinks or equivalent, 14 Shots of liquor per week  . Drug use: No     Medication list has been reviewed and updated.  PHQ 2/9 Scores 08/23/2017 08/23/2017 08/07/2016 01/19/2016  PHQ - 2 Score 0 0 0 0    Physical Exam  Constitutional: He is oriented to person, place, and time. He appears well-developed. No distress.  HENT:  Head: Normocephalic and atraumatic.  Neck: Normal range of motion. Neck supple.  Cardiovascular: Normal rate, regular rhythm and normal heart sounds.  Pulmonary/Chest: Effort normal and breath sounds normal. No respiratory distress.  Musculoskeletal: Normal range of motion.  Neurological: He is alert and oriented to person, place, and time.  Skin: Skin is warm and dry. No rash noted.  Psychiatric: He has a normal mood and affect. His behavior is normal. Thought content normal.    BP 138/82   Pulse 86   Ht 5\' 8"  (1.727 m)   Wt 227 lb (103 kg)   SpO2 95%   BMI 34.52 kg/m   Assessment and Plan: 1. Type 2 diabetes mellitus without  complication, without long-term current use of insulin (HCC) Continue current therapy - losartan (COZAAR) 25 MG tablet; Take 1 tablet (25 mg total) by mouth daily.  Dispense: 90 tablet; Refill: 1 - empagliflozin (JARDIANCE) 10 MG TABS tablet; Take 10 mg by mouth daily.  Dispense: 90 tablet; Refill: 1 - metFORMIN (GLUCOPHAGE) 1000 MG tablet; Take 1 tablet (1,000 mg total) by mouth 2 (two) times daily.  Dispense: 180 tablet; Refill: 1 - Comprehensive metabolic panel - Hemoglobin A1c  2. Hyperlipidemia associated with type 2 diabetes mellitus ([place]) Will start low dose pravastatin  3. Elevated blood pressure, situational Improved with losartan  4. Bilateral carpal tunnel syndrome Recommend follow up with Ortho   Meds ordered this encounter  Medications  . losartan (COZAAR) 25 MG tablet    Sig: Take 1 tablet (25 mg total) by mouth daily.    Dispense:  90 tablet    Refill:  1  . pravastatin (PRAVACHOL) 20 MG tablet    Sig: Take 1 tablet (20 mg total) by mouth daily.    Dispense:  90 tablet    Refill:  1  . empagliflozin (JARDIANCE) 10 MG TABS tablet    Sig: Take 10 mg by mouth daily.  Dispense:  90 tablet    Refill:  1  . metFORMIN (GLUCOPHAGE) 1000 MG tablet    Sig: Take 1 tablet (1,000 mg total) by mouth 2 (two) times daily.    Dispense:  180 tablet    Refill:  1    Partially dictated using Editor, commissioning. Any errors are unintentional.  Halina Maidens, MD [facility]  12/24/2017

## 2017-12-25 LAB — COMPREHENSIVE METABOLIC PANEL
A/G RATIO: 1.6 (ref 1.2–2.2)
ALBUMIN: 4.1 g/dL (ref 3.5–4.8)
ALT: 28 IU/L (ref 0–44)
AST: 23 IU/L (ref 0–40)
Alkaline Phosphatase: 65 IU/L (ref 39–117)
BUN/Creatinine Ratio: 21 (ref 10–24)
BUN: 19 mg/dL (ref 8–27)
Bilirubin Total: 0.7 mg/dL (ref 0.0–1.2)
CALCIUM: 9.5 mg/dL (ref 8.6–10.2)
CO2: 25 mmol/L (ref 20–29)
Chloride: 98 mmol/L (ref 96–106)
Creatinine, Ser: 0.89 mg/dL (ref 0.76–1.27)
GFR, EST AFRICAN AMERICAN: 96 mL/min/{1.73_m2} (ref >59)
GFR, EST NON AFRICAN AMERICAN: 83 mL/min/{1.73_m2} (ref >59)
GLOBULIN, TOTAL: 2.6 g/dL (ref 1.5–4.5)
Glucose: 104 mg/dL — ABNORMAL HIGH (ref 65–99)
POTASSIUM: 4.6 mmol/L (ref 3.5–5.2)
SODIUM: 141 mmol/L (ref 134–144)
TOTAL PROTEIN: 6.7 g/dL (ref 6.0–8.5)

## 2017-12-25 LAB — HEMOGLOBIN A1C
Est. average glucose Bld gHb Est-mCnc: 123 mg/dL
Hgb A1c MFr Bld: 5.9 % — ABNORMAL HIGH (ref 4.8–5.6)

## 2018-02-17 ENCOUNTER — Other Ambulatory Visit: Payer: Self-pay | Admitting: Internal Medicine

## 2018-02-17 DIAGNOSIS — E119 Type 2 diabetes mellitus without complications: Secondary | ICD-10-CM

## 2018-04-26 ENCOUNTER — Encounter: Payer: Self-pay | Admitting: Internal Medicine

## 2018-04-26 ENCOUNTER — Ambulatory Visit (INDEPENDENT_AMBULATORY_CARE_PROVIDER_SITE_OTHER): Payer: Medicare Other | Admitting: Internal Medicine

## 2018-04-26 VITALS — BP 118/68 | HR 93 | Ht 68.0 in | Wt 227.0 lb

## 2018-04-26 DIAGNOSIS — E119 Type 2 diabetes mellitus without complications: Secondary | ICD-10-CM

## 2018-04-26 DIAGNOSIS — E1169 Type 2 diabetes mellitus with other specified complication: Secondary | ICD-10-CM

## 2018-04-26 DIAGNOSIS — K219 Gastro-esophageal reflux disease without esophagitis: Secondary | ICD-10-CM

## 2018-04-26 DIAGNOSIS — E785 Hyperlipidemia, unspecified: Secondary | ICD-10-CM | POA: Diagnosis not present

## 2018-04-26 DIAGNOSIS — R030 Elevated blood-pressure reading, without diagnosis of hypertension: Secondary | ICD-10-CM | POA: Diagnosis not present

## 2018-04-26 MED ORDER — PRAVASTATIN SODIUM 20 MG PO TABS: 20.0000 mg | ORAL_TABLET | Freq: Every day | ORAL | 1 refills | Status: DC

## 2018-04-26 MED ORDER — METFORMIN HCL 1000 MG PO TABS: 1000.0000 mg | ORAL_TABLET | Freq: Two times a day (BID) | ORAL | 1 refills | Status: DC

## 2018-04-26 MED ORDER — OMEPRAZOLE 20 MG PO CPDR: 20.0000 mg | DELAYED_RELEASE_CAPSULE | Freq: Two times a day (BID) | ORAL | 1 refills | Status: DC

## 2018-04-26 MED ORDER — LOSARTAN POTASSIUM 25 MG PO TABS: 25.0000 mg | ORAL_TABLET | Freq: Every day | ORAL | 1 refills | Status: DC

## 2018-04-26 MED ORDER — EMPAGLIFLOZIN 10 MG PO TABS: 10.0000 mg | ORAL_TABLET | Freq: Every day | ORAL | 1 refills | Status: DC

## 2018-04-26 NOTE — Progress Notes (Signed)
Date:  04/26/2018   Name:  Thomas Allen   DOB:  Feb 25, 1941   MRN:  [id number]   Chief Complaint: Hypertension and Diabetes Diabetes  He presents for his follow-up diabetic visit. He has type 2 diabetes mellitus. His disease course has been stable. Pertinent negatives for hypoglycemia include no headaches or tremors. Pertinent negatives for diabetes include no chest pain, no fatigue, no polydipsia and no polyuria. He monitors blood glucose at home 1-2 x per week. There is no change in his home blood glucose trend. His breakfast blood glucose is taken between 6-7 am. His breakfast blood glucose range is generally 110-130 mg/dl. An ACE inhibitor/angiotensin II receptor blocker is being taken. Eye exam is current.  Hypertension  Pertinent negatives include no chest pain, headaches, palpitations or shortness of breath.  Gastroesophageal Reflux  He reports no abdominal pain, no chest pain, no coughing or no wheezing. The problem occurs rarely. Pertinent negatives include no fatigue. He has tried a PPI for the symptoms.  Hyperlipidemia  This is a chronic problem. The problem is controlled. Pertinent negatives include no chest pain or shortness of breath. Current antihyperlipidemic treatment includes statins. The current treatment provides significant improvement of lipids.   Lab Results  Component Value Date   HGBA1C 5.9 (H) 12/24/2017   Lab Results  Component Value Date   CREATININE 0.89 12/24/2017   BUN 19 12/24/2017   NA 141 12/24/2017   K 4.6 12/24/2017   CL 98 12/24/2017   CO2 25 12/24/2017      Review of Systems  Constitutional: Negative for appetite change, fatigue and unexpected weight change.  Eyes: Negative for visual disturbance.  Respiratory: Negative for cough, shortness of breath and wheezing.   Cardiovascular: Negative for chest pain, palpitations and leg swelling.  Gastrointestinal: Negative for abdominal pain and blood in stool.  Endocrine: Negative for polydipsia  and polyuria.  Genitourinary: Negative for dysuria and hematuria.  Skin: Negative for color change and rash.  Neurological: Negative for tremors, numbness and headaches.  Psychiatric/Behavioral: Negative for dysphoric mood.    Patient Active Problem List   Diagnosis Date Noted  . Hand pain, right 08/23/2017  . Erectile dysfunction following radiation therapy 06/26/2017  . Hyperlipidemia associated with type 2 diabetes mellitus ([place]) 03/12/2017  . Asymptomatic PVCs 02/05/2017  . Intractable hiccups 02/05/2017  . Gastroesophageal reflux disease 02/05/2017  . Alcohol use disorder 01/19/2016  . Type 2 diabetes mellitus without complication, without long-term current use of insulin ([place]) 07/26/2015  . Carpal tunnel syndrome 03/30/2015  . Elevated blood pressure, situational 03/30/2015  . Abnormal LFTs 03/30/2015  . H/O adenomatous polyp of colon 03/30/2015  . Tobacco use disorder, moderate, in sustained remission 03/30/2015  . History of prostate cancer 09/02/2013  . Posterior calcaneal exostosis 02/26/2013  . Calcific Achilles tendinitis 02/26/2013    Allergies  Allergen Reactions  . Ace Inhibitors Cough    Past Surgical History:  Procedure Laterality Date  . APPENDECTOMY    . COLONOSCOPY  2008   benign polyps  . COLONOSCOPY WITH PROPOFOL N/A 08/07/2017   Procedure: COLONOSCOPY WITH PROPOFOL;  Surgeon: Lollie Sails, MD;  Location: [facility] ENDOSCOPY;  Service: Endoscopy;  Laterality: N/A;  . ESOPHAGOGASTRODUODENOSCOPY (EGD) WITH PROPOFOL N/A 08/07/2017   Procedure: ESOPHAGOGASTRODUODENOSCOPY (EGD) WITH PROPOFOL;  Surgeon: Lollie Sails, MD;  Location: [facility] ENDOSCOPY;  Service: Endoscopy;  Laterality: N/A;  . JOINT REPLACEMENT Right 2009  . JOINT REPLACEMENT Left 2015  . lithopexy    . PROSTATE  BIOPSY  2012   malignant  . REPLACEMENT TOTAL KNEE Bilateral     Social History   Tobacco Use  . Smoking status: Former Smoker    Last attempt to quit: 1976    Years since  quitting: 43.6  . Smokeless tobacco: Never Used  Substance Use Topics  . Alcohol use: Yes    Alcohol/week: 34.0 standard drinks    Types: 20 Standard drinks or equivalent, 14 Shots of liquor per week  . Drug use: No     Medication list has been reviewed and updated.  Current Meds  Medication Sig  . aspirin 325 MG tablet Take by mouth.  . empagliflozin (JARDIANCE) 10 MG TABS tablet Take 10 mg by mouth daily.  . Garlic 154 MG TABS Take by mouth.  . losartan (COZAAR) 25 MG tablet Take 1 tablet (25 mg total) by mouth daily.  . metFORMIN (GLUCOPHAGE) 1000 MG tablet Take 1 tablet (1,000 mg total) by mouth 2 (two) times daily.  . Multiple Vitamins-Minerals (MULTIVITAMIN WITH MINERALS) tablet Take 1 tablet by mouth daily.  Marland Kitchen omeprazole (PRILOSEC) 20 MG capsule Take 1 capsule (20 mg total) by mouth 2 (two) times daily before a meal.  . pravastatin (PRAVACHOL) 20 MG tablet Take 1 tablet (20 mg total) by mouth daily.    PHQ 2/9 Scores 08/23/2017 08/23/2017 08/07/2016 01/19/2016  PHQ - 2 Score 0 0 0 0    Physical Exam  Constitutional: He is oriented to person, place, and time. He appears well-developed. No distress.  HENT:  Head: Normocephalic and atraumatic.  Cardiovascular: Normal rate, regular rhythm and normal heart sounds.  Pulmonary/Chest: Effort normal and breath sounds normal. No respiratory distress.  Musculoskeletal: Normal range of motion.  Neurological: He is alert and oriented to person, place, and time.  Skin: Skin is warm and dry. No rash noted.  Psychiatric: He has a normal mood and affect. His behavior is normal. Thought content normal.  Nursing note and vitals reviewed.   BP 118/68 (BP Location: Right Arm, Patient Position: Sitting, Cuff Size: Normal)   Pulse 93   Ht 5\' 8"  (1.727 m)   Wt 227 lb (103 kg)   SpO2 97%   BMI 34.52 kg/m   Assessment and Plan: 1. Type 2 diabetes mellitus without complication, without long-term current use of insulin (HCC) controlled -  Hemoglobin M0Q - Basic metabolic panel - metFORMIN (GLUCOPHAGE) 1000 MG tablet; Take 1 tablet (1,000 mg total) by mouth 2 (two) times daily.  Dispense: 180 tablet; Refill: 1 - empagliflozin (JARDIANCE) 10 MG TABS tablet; Take 10 mg by mouth daily.  Dispense: 90 tablet; Refill: 1 - losartan (COZAAR) 25 MG tablet; Take 1 tablet (25 mg total) by mouth daily.  Dispense: 90 tablet; Refill: 1  2. Hyperlipidemia associated with type 2 diabetes mellitus (HCC) On statin - pravastatin (PRAVACHOL) 20 MG tablet; Take 1 tablet (20 mg total) by mouth daily.  Dispense: 90 tablet; Refill: 1  3. Elevated blood pressure, situational Doing well  4. Gastroesophageal reflux disease, esophagitis presence not specified Stable without sx - omeprazole (PRILOSEC) 20 MG capsule; Take 1 capsule (20 mg total) by mouth 2 (two) times daily before a meal.  Dispense: 180 capsule; Refill: 1   Meds ordered this encounter  Medications  . metFORMIN (GLUCOPHAGE) 1000 MG tablet    Sig: Take 1 tablet (1,000 mg total) by mouth 2 (two) times daily.    Dispense:  180 tablet    Refill:  1  . empagliflozin (JARDIANCE)  10 MG TABS tablet    Sig: Take 10 mg by mouth daily.    Dispense:  90 tablet    Refill:  1  . omeprazole (PRILOSEC) 20 MG capsule    Sig: Take 1 capsule (20 mg total) by mouth 2 (two) times daily before a meal.    Dispense:  180 capsule    Refill:  1  . pravastatin (PRAVACHOL) 20 MG tablet    Sig: Take 1 tablet (20 mg total) by mouth daily.    Dispense:  90 tablet    Refill:  1  . losartan (COZAAR) 25 MG tablet    Sig: Take 1 tablet (25 mg total) by mouth daily.    Dispense:  90 tablet    Refill:  1    Partially dictated using Editor, commissioning. Any errors are unintentional.  Halina Maidens, MD [facility]  04/26/2018

## 2018-04-27 LAB — BASIC METABOLIC PANEL
BUN/Creatinine Ratio: 17 (ref 10–24)
BUN: 15 mg/dL (ref 8–27)
CALCIUM: 9.5 mg/dL (ref 8.6–10.2)
CHLORIDE: 99 mmol/L (ref 96–106)
CO2: 18 mmol/L — ABNORMAL LOW (ref 20–29)
Creatinine, Ser: 0.89 mg/dL (ref 0.76–1.27)
GFR calc non Af Amer: 83 mL/min/{1.73_m2} (ref >59)
GFR, EST AFRICAN AMERICAN: 96 mL/min/{1.73_m2} (ref >59)
Glucose: 103 mg/dL — ABNORMAL HIGH (ref 65–99)
POTASSIUM: 4.7 mmol/L (ref 3.5–5.2)
SODIUM: 137 mmol/L (ref 134–144)

## 2018-04-27 LAB — HEMOGLOBIN A1C
ESTIMATED AVERAGE GLUCOSE: 126 mg/dL
HEMOGLOBIN A1C: 6 % — AB (ref 4.8–5.6)

## 2018-05-28 ENCOUNTER — Telehealth: Payer: Self-pay

## 2018-05-28 NOTE — Telephone Encounter (Signed)
ERROR

## 2018-07-22 ENCOUNTER — Other Ambulatory Visit: Payer: Self-pay

## 2018-08-26 ENCOUNTER — Ambulatory Visit: Payer: Medicare Other

## 2018-09-13 ENCOUNTER — Telehealth: Payer: Self-pay

## 2018-09-13 NOTE — Telephone Encounter (Signed)
Of course he can take all those medications together.  He should probably stop the aspirin for 5 days before and resume the day after.

## 2018-09-13 NOTE — Telephone Encounter (Signed)
Patient wife called stating patient has to have a tooth extracted next week and he wants to know if he is okay to continue to take Metformin and Jardiance while he is taking Penicillin. He has to take Penicillin until he has tooth pulled.   Please Advise.

## 2018-09-13 NOTE — Telephone Encounter (Signed)
Called and left VM informing of this information.

## 2018-09-18 ENCOUNTER — Ambulatory Visit (INDEPENDENT_AMBULATORY_CARE_PROVIDER_SITE_OTHER): Payer: Medicare Other | Admitting: Internal Medicine

## 2018-09-18 ENCOUNTER — Encounter: Payer: Self-pay | Admitting: Internal Medicine

## 2018-09-18 VITALS — BP 118/70 | HR 88 | Ht 68.0 in | Wt 220.0 lb

## 2018-09-18 DIAGNOSIS — R030 Elevated blood-pressure reading, without diagnosis of hypertension: Secondary | ICD-10-CM

## 2018-09-18 DIAGNOSIS — E119 Type 2 diabetes mellitus without complications: Secondary | ICD-10-CM | POA: Diagnosis not present

## 2018-09-18 DIAGNOSIS — R7989 Other specified abnormal findings of blood chemistry: Secondary | ICD-10-CM

## 2018-09-18 DIAGNOSIS — Z8546 Personal history of malignant neoplasm of prostate: Secondary | ICD-10-CM

## 2018-09-18 DIAGNOSIS — G5601 Carpal tunnel syndrome, right upper limb: Secondary | ICD-10-CM | POA: Diagnosis not present

## 2018-09-18 DIAGNOSIS — E1169 Type 2 diabetes mellitus with other specified complication: Secondary | ICD-10-CM | POA: Diagnosis not present

## 2018-09-18 DIAGNOSIS — K219 Gastro-esophageal reflux disease without esophagitis: Secondary | ICD-10-CM

## 2018-09-18 DIAGNOSIS — R945 Abnormal results of liver function studies: Secondary | ICD-10-CM

## 2018-09-18 DIAGNOSIS — E785 Hyperlipidemia, unspecified: Secondary | ICD-10-CM

## 2018-09-18 NOTE — Progress Notes (Signed)
Date:  09/18/2018   Name:  Thomas Allen   DOB:  April 25, 1941   MRN:  [id number]   Chief Complaint: Annual Exam and Diabetes (Foot Exam.) Patient continues to decline all vaccinations. He feels well - had a brief bout of diarrhea several weeks ago that resolved.  Hypertension  This is a chronic problem. The problem is controlled. Pertinent negatives include no chest pain, headaches, palpitations or shortness of breath. Past treatments include angiotensin blockers. The current treatment provides significant improvement.  Diabetes  He presents for his follow-up diabetic visit. He has type 2 diabetes mellitus. His disease course has been stable. Pertinent negatives for hypoglycemia include no dizziness, headaches, nervousness/anxiousness or tremors. Pertinent negatives for diabetes include no chest pain, no fatigue, no polydipsia and no polyuria. Current diabetic treatment includes oral agent (dual therapy) (metformin and jardiance). He is compliant with treatment all of the time. An ACE inhibitor/angiotensin II receptor blocker is being taken. He does not see a podiatrist.Eye exam is not current.  Hyperlipidemia  This is a chronic problem. The problem is controlled. Pertinent negatives include no chest pain or shortness of breath. Current antihyperlipidemic treatment includes statins. The current treatment provides significant improvement of lipids.  Erectile Dysfunction  This is a chronic problem. The problem is unchanged. He reports no anxiety. Pertinent negatives include no dysuria or hematuria. Risk factors include prostate surgery.    Review of Systems  Constitutional: Negative for appetite change, fatigue and unexpected weight change.  HENT: Negative for trouble swallowing.   Eyes: Negative for visual disturbance.  Respiratory: Negative for cough, shortness of breath and wheezing.   Cardiovascular: Negative for chest pain, palpitations and leg swelling.  Gastrointestinal: Negative for  abdominal pain and blood in stool.  Endocrine: Negative for polydipsia and polyuria.  Genitourinary: Negative for dysuria and hematuria.  Musculoskeletal: Positive for arthralgias (CTS on right).  Skin: Negative for color change and rash.  Neurological: Negative for dizziness, tremors, numbness and headaches.  Hematological: Negative for adenopathy. Does not bruise/bleed easily.  Psychiatric/Behavioral: Positive for sleep disturbance. Negative for dysphoric mood. The patient is not nervous/anxious.     Patient Active Problem List   Diagnosis Date Noted  . Hand pain, right 08/23/2017  . Erectile dysfunction following radiation therapy 06/26/2017  . Hyperlipidemia associated with type 2 diabetes mellitus ([place]) 03/12/2017  . Asymptomatic PVCs 02/05/2017  . Intractable hiccups 02/05/2017  . Gastroesophageal reflux disease 02/05/2017  . Alcohol use disorder 01/19/2016  . Type 2 diabetes mellitus without complication, without long-term current use of insulin ([place]) 07/26/2015  . Carpal tunnel syndrome 03/30/2015  . Elevated blood pressure, situational 03/30/2015  . Abnormal LFTs 03/30/2015  . H/O adenomatous polyp of colon 03/30/2015  . Tobacco use disorder, moderate, in sustained remission 03/30/2015  . History of prostate cancer 09/02/2013  . Posterior calcaneal exostosis 02/26/2013  . Calcific Achilles tendinitis 02/26/2013    Allergies  Allergen Reactions  . Ace Inhibitors Cough    Past Surgical History:  Procedure Laterality Date  . APPENDECTOMY    . COLONOSCOPY  2008   benign polyps  . COLONOSCOPY WITH PROPOFOL N/A 08/07/2017   Procedure: COLONOSCOPY WITH PROPOFOL;  Surgeon: Lollie Sails, MD;  [facility] ENDOSCOPY;  Service: Endoscopy;  Laterality: N/A;  . ESOPHAGOGASTRODUODENOSCOPY (EGD) WITH PROPOFOL N/A 08/07/2017   Procedure: ESOPHAGOGASTRODUODENOSCOPY (EGD) WITH PROPOFOL;  Surgeon: Lollie Sails, MD;  Location: [facility] END[facility]COPY;  Service: Endoscopy;   Laterality: N/A;  . JOINT REPLACEMENT Right 2009  .  JOINT REPLACEMENT Left 2015  . lithopexy    . PROSTATE BIOPSY  2012   malignant  . REPLACEMENT TOTAL KNEE Bilateral     Social History   Tobacco Use  . Smoking status: Former Smoker    Last attempt to quit: 1976    Years since quitting: 44.0  . Smokeless tobacco: Never Used  Substance Use Topics  . Alcohol use: Yes    Alcohol/week: 34.0 standard drinks    Types: 20 Standard drinks or equivalent, 14 Shots of liquor per week  . Drug use: No     Medication list has been reviewed and updated.  Current Meds  Medication Sig  . aspirin 325 MG tablet Take by mouth.  . empagliflozin (JARDIANCE) 10 MG TABS tablet Take 10 mg by mouth daily.  Marland Kitchen losartan (COZAAR) 25 MG tablet Take 1 tablet (25 mg total) by mouth daily.  . metFORMIN (GLUCOPHAGE) 1000 MG tablet Take 1 tablet (1,000 mg total) by mouth 2 (two) times daily.  . Multiple Vitamins-Minerals (MULTIVITAMIN WITH MINERALS) tablet Take 1 tablet by mouth daily.  Marland Kitchen omeprazole (PRILOSEC) 20 MG capsule Take 1 capsule (20 mg total) by mouth 2 (two) times daily before a meal.  . pravastatin (PRAVACHOL) 20 MG tablet Take 1 tablet (20 mg total) by mouth daily.    PHQ 2/9 Scores 09/18/2018 08/23/2017 08/23/2017 08/07/2016  PHQ - 2 Score 0 0 0 0   Fall Risk  09/18/2018 07/22/2018 08/23/2017 08/23/2017 08/07/2016  Falls in the past year? 0 0 No No No  Comment - Emmi Telephone Survey: data to providers prior to load - - -  Number falls in past yr: 0 - - - -  Injury with Fall? 0 - - - -  Follow up Falls evaluation completed - - - -   Functional Status Survey: Is the patient deaf or have difficulty hearing?: No Does the patient have difficulty seeing, even when wearing glasses/contacts?: No Does the patient have difficulty concentrating, remembering, or making decisions?: No Does the patient have difficulty walking or climbing stairs?: No Does the patient have difficulty dressing or  bathing?: No Does the patient have difficulty doing errands alone such as visiting a doctor's office or shopping?: No   Physical Exam Vitals signs and nursing note reviewed.  Constitutional:      Appearance: Normal appearance. He is well-developed.  HENT:     Head: Normocephalic.     Right Ear: Tympanic membrane, ear canal and external ear normal.     Left Ear: Tympanic membrane, ear canal and external ear normal.     Nose: Nose normal.     Mouth/Throat:     Pharynx: Uvula midline.  Eyes:     Conjunctiva/sclera: Conjunctivae normal.     Pupils: Pupils are equal, round, and reactive to light.  Neck:     Musculoskeletal: Normal range of motion and neck supple.     Thyroid: No thyromegaly.     Vascular: No carotid bruit.  Cardiovascular:     Rate and Rhythm: Normal rate and regular rhythm.     Heart sounds: Normal heart sounds.  Pulmonary:     Effort: Pulmonary effort is normal.     Breath sounds: Normal breath sounds. No wheezing.  Chest:     Breasts:        Right: No mass.        Left: No mass.  Abdominal:     General: Bowel sounds are normal.     Palpations: Abdomen  is soft.     Tenderness: There is no abdominal tenderness.  Musculoskeletal: Normal range of motion.       Arms:  Lymphadenopathy:     Cervical: No cervical adenopathy.  Skin:    General: Skin is warm and dry.  Neurological:     Mental Status: He is alert and oriented to person, place, and time.     Deep Tendon Reflexes: Reflexes are normal and symmetric.  Psychiatric:        Attention and Perception: Attention normal.        Mood and Affect: Mood normal.        Speech: Speech normal.        Behavior: Behavior normal.        Thought Content: Thought content normal.        Judgment: Judgment normal.    Wt Readings from Last 3 Encounters:  09/18/18 220 lb (99.8 kg)  04/26/18 227 lb (103 kg)  12/24/17 227 lb (103 kg)    BP 118/70   Pulse 88   Ht 5\' 8"  (1.727 m)   Wt 220 lb (99.8 kg)   SpO2 95%    BMI 33.45 kg/m   Assessment and Plan: 1. Elevated blood pressure, situational controlled - Comprehensive metabolic panel  2. Gastroesophageal reflux disease, esophagitis presence not specified Stable on PPI - CBC with Differential/Platelet  3. Type 2 diabetes mellitus without complication, without long-term current use of insulin (HCC) Controlled on oral agents Schedule eye exam Schedule podiatry to trim nails - Hemoglobin A1c - PSA  4. Hyperlipidemia associated with type 2 diabetes mellitus ([place]) On statin therapy - Lipid panel  5. Abnormal LFTs Recheck labs Limit alcohol - Comprehensive metabolic panel  6. History of prostate cancer If ED worsens, would refer back to Urology - PSA  7. Carpal tunnel syndrome of right wrist Recommend follow up with Ortho for repeat injection   Partially dictated using Editor, commissioning. Any errors are unintentional.  Halina Maidens, MD [facility]  09/18/2018

## 2018-09-19 LAB — CBC WITH DIFFERENTIAL/PLATELET
BASOS ABS: 0 10*3/uL (ref 0.0–0.2)
Basos: 1 %
EOS (ABSOLUTE): 0.2 10*3/uL (ref 0.0–0.4)
Eos: 3 %
HEMATOCRIT: 47.6 % (ref 37.5–51.0)
HEMOGLOBIN: 15.4 g/dL (ref 13.0–17.7)
IMMATURE GRANS (ABS): 0 10*3/uL (ref 0.0–0.1)
Immature Granulocytes: 1 %
LYMPHS: 21 %
Lymphocytes Absolute: 1.5 10*3/uL (ref 0.7–3.1)
MCH: 32.6 pg (ref 26.6–33.0)
MCHC: 32.4 g/dL (ref 31.5–35.7)
MCV: 101 fL — ABNORMAL HIGH (ref 79–97)
MONOCYTES: 9 %
Monocytes Absolute: 0.7 10*3/uL (ref 0.1–0.9)
NEUTROS ABS: 4.8 10*3/uL (ref 1.4–7.0)
Neutrophils: 65 %
Platelets: 255 10*3/uL (ref 150–450)
RBC: 4.73 x10E6/uL (ref 4.14–5.80)
RDW: 12.5 % (ref 11.6–15.4)
WBC: 7.3 10*3/uL (ref 3.4–10.8)

## 2018-09-19 LAB — COMPREHENSIVE METABOLIC PANEL
ALBUMIN: 4.3 g/dL (ref 3.5–4.8)
ALT: 42 IU/L (ref 0–44)
AST: 30 IU/L (ref 0–40)
Albumin/Globulin Ratio: 1.6 (ref 1.2–2.2)
Alkaline Phosphatase: 74 IU/L (ref 39–117)
BUN / CREAT RATIO: 16 (ref 10–24)
BUN: 14 mg/dL (ref 8–27)
Bilirubin Total: 0.3 mg/dL (ref 0.0–1.2)
CO2: 21 mmol/L (ref 20–29)
CREATININE: 0.87 mg/dL (ref 0.76–1.27)
Calcium: 9.6 mg/dL (ref 8.6–10.2)
Chloride: 99 mmol/L (ref 96–106)
GFR calc non Af Amer: 84 mL/min/{1.73_m2} (ref >59)
GFR, EST AFRICAN AMERICAN: 97 mL/min/{1.73_m2} (ref >59)
GLUCOSE: 127 mg/dL — AB (ref 65–99)
Globulin, Total: 2.7 g/dL (ref 1.5–4.5)
Potassium: 4.5 mmol/L (ref 3.5–5.2)
Sodium: 136 mmol/L (ref 134–144)
TOTAL PROTEIN: 7 g/dL (ref 6.0–8.5)

## 2018-09-19 LAB — LIPID PANEL
CHOL/HDL RATIO: 3.4 ratio (ref 0.0–5.0)
Cholesterol, Total: 158 mg/dL (ref 100–199)
HDL: 46 mg/dL (ref >39)
LDL CALC: 90 mg/dL (ref 0–99)
TRIGLYCERIDES: 112 mg/dL (ref 0–149)
VLDL CHOLESTEROL CAL: 22 mg/dL (ref 5–40)

## 2018-09-19 LAB — PSA: Prostate Specific Ag, Serum: 1.1 ng/mL (ref 0.0–4.0)

## 2018-09-19 LAB — HEMOGLOBIN A1C
Est. average glucose Bld gHb Est-mCnc: 137 mg/dL
Hgb A1c MFr Bld: 6.4 % — ABNORMAL HIGH (ref 4.8–5.6)

## 2018-11-04 ENCOUNTER — Other Ambulatory Visit: Payer: Self-pay

## 2018-11-04 ENCOUNTER — Encounter: Payer: Self-pay | Admitting: Internal Medicine

## 2018-11-04 ENCOUNTER — Ambulatory Visit (INDEPENDENT_AMBULATORY_CARE_PROVIDER_SITE_OTHER): Payer: Medicare Other | Admitting: Internal Medicine

## 2018-11-04 VITALS — BP 128/80 | HR 72 | Ht 68.0 in | Wt 218.0 lb

## 2018-11-04 DIAGNOSIS — R194 Change in bowel habit: Secondary | ICD-10-CM

## 2018-11-04 NOTE — Patient Instructions (Signed)
Fibercon tablets (or similar) take 1 tablet twice a day

## 2018-11-04 NOTE — Progress Notes (Signed)
Date:  11/04/2018   Name:  Thomas Allen   DOB:  1941-04-27   MRN:  [id number]   Chief Complaint: Diarrhea (Patient having explosive diarrhea. In remission for prostate cancer. Concerned. Happened 3 times about 3 weeks apart. Friday was last time. No blood or pain.  )  Diarrhea   This is a new problem. The current episode started 1 to 4 weeks ago. Episode frequency: occured three times, several weeks apar. The stool consistency is described as watery. The patient states that diarrhea does not awaken him from sleep. Pertinent negatives include no abdominal pain, bloating, chills, fever, headaches, sweats, vomiting or weight loss. Nothing aggravates the symptoms. There are no known risk factors. He has tried nothing for the symptoms.  Twice he had sudden watery diarrhea such that he could not get to the toilet in time.  Last week he had fecal urgency again but the stool was soft.  In between these episodes he has normal formed stool without urgency or incontinence. He has city water but drinks mostly bottled water.  No recent antibiotics, no change in diet, no use of artificial sweeteners, no weight loss. Normal colonoscopy in 08/2017.  Review of Systems  Constitutional: Negative for chills, fever, unexpected weight change and weight loss.  HENT: Negative for trouble swallowing.   Respiratory: Negative for chest tightness and shortness of breath.   Cardiovascular: Negative for chest pain.  Gastrointestinal: Positive for diarrhea. Negative for abdominal pain, bloating, blood in stool, constipation and vomiting.  Neurological: Negative for dizziness and headaches.    Patient Active Problem List   Diagnosis Date Noted  . Hand pain, right 08/23/2017  . Erectile dysfunction following radiation therapy 06/26/2017  . Hyperlipidemia associated with type 2 diabetes mellitus ([place]) 03/12/2017  . Asymptomatic PVCs 02/05/2017  . Intractable hiccups 02/05/2017  . Gastroesophageal reflux disease  02/05/2017  . Alcohol use disorder 01/19/2016  . Type 2 diabetes mellitus without complication, without long-term current use of insulin ([place]) 07/26/2015  . Carpal tunnel syndrome 03/30/2015  . Elevated blood pressure, situational 03/30/2015  . Abnormal LFTs 03/30/2015  . H/O adenomatous polyp of colon 03/30/2015  . Tobacco use disorder, moderate, in sustained remission 03/30/2015  . History of prostate cancer 09/02/2013  . Posterior calcaneal exostosis 02/26/2013  . Calcific Achilles tendinitis 02/26/2013    Allergies  Allergen Reactions  . Ace Inhibitors Cough    Past Surgical History:  Procedure Laterality Date  . APPENDECTOMY    . COLONOSCOPY  2008   benign polyps  . COLONOSCOPY WITH PROPOFOL N/A 08/07/2017   Procedure: COLONOSCOPY WITH PROPOFOL;  Surgeon: Lollie Sails, MD;  Location[facility]COPY;  Service: Endoscopy;  Laterality: N/A;  . ESOPHAGOGASTRODUODENOSCOPY (EGD) WITH PROPOFOL N/A 08/07/2017   Procedure: ESOPHAGOGASTRODUODENOSCOPY (EGD) WITH PROPOFOL;  Surgeon: Lollie Sails, MD;  Location: [facility] ENDOSCOPY;  Service: Endoscopy;  Laterality: N/A;  . JOINT REPLACEMENT Right 2009  . JOINT REPLACEMENT Left 2015  . lithopexy    . PROSTATE BIOPSY  2012   malignant  . REPLACEMENT TOTAL KNEE Bilateral     Social History   Tobacco Use  . Smoking status: Former Smoker    Last attempt to quit: 1976    Years since quitting: 44.1  . Smokeless tobacco: Never Used  Substance Use Topics  . Alcohol use: Yes    Alcohol/week: 34.0 standard drinks    Types: 20 Standard drinks or equivalent, 14 Shots of liquor per week  . Drug use: No  Medication list has been reviewed and updated.  Current Meds  Medication Sig  . aspirin 325 MG tablet Take by mouth.  . empagliflozin (JARDIANCE) 10 MG TABS tablet Take 10 mg by mouth daily.  . Garlic 408 MG TABS Take by mouth.  . losartan (COZAAR) 25 MG tablet Take 1 tablet (25 mg total) by mouth daily.  . metFORMIN  (GLUCOPHAGE) 1000 MG tablet Take 1 tablet (1,000 mg total) by mouth 2 (two) times daily.  . Multiple Vitamins-Minerals (MULTIVITAMIN WITH MINERALS) tablet Take 1 tablet by mouth daily.  Marland Kitchen omeprazole (PRILOSEC) 20 MG capsule Take 1 capsule (20 mg total) by mouth 2 (two) times daily before a meal.  . pravastatin (PRAVACHOL) 20 MG tablet Take 1 tablet (20 mg total) by mouth daily.    PHQ 2/9 Scores 11/04/2018 09/18/2018 08/23/2017 08/23/2017  PHQ - 2 Score 0 0 0 0   Wt Readings from Last 3 Encounters:  11/04/18 218 lb (98.9 kg)  09/18/18 220 lb (99.8 kg)  04/26/18 227 lb (103 kg)    Physical Exam Vitals signs and nursing note reviewed.  Constitutional:      General: He is not in acute distress.    Appearance: He is well-developed.  HENT:     Head: Normocephalic and atraumatic.  Cardiovascular:     Rate and Rhythm: Normal rate and regular rhythm.     Pulses: Normal pulses.  Pulmonary:     Effort: Pulmonary effort is normal. No respiratory distress.     Breath sounds: Normal breath sounds.  Abdominal:     General: There is no distension.     Palpations: Abdomen is soft. There is no mass.     Tenderness: There is no abdominal tenderness. There is no guarding or rebound.  Musculoskeletal: Normal range of motion.     Right lower leg: No edema.     Left lower leg: No edema.  Lymphadenopathy:     Cervical: No cervical adenopathy.  Skin:    General: Skin is warm and dry.     Findings: No rash.  Neurological:     Mental Status: He is alert and oriented to person, place, and time.  Psychiatric:        Behavior: Behavior normal.        Thought Content: Thought content normal.     BP 128/80   Pulse 72   Ht 5\' 8"  (1.727 m)   Wt 218 lb (98.9 kg)   SpO2 98%   BMI 33.15 kg/m   Assessment and Plan: 1. Change in bowel habits Suspect functional but uncertain cause without red flags signs Recommend trial of fiber supplement daily Call or return if persistent.   Partially dictated  using Editor, commissioning. Any errors are unintentional.  Halina Maidens, MD [facility]  11/04/2018

## 2018-11-11 ENCOUNTER — Ambulatory Visit (INDEPENDENT_AMBULATORY_CARE_PROVIDER_SITE_OTHER): Payer: Medicare Other

## 2018-11-11 VITALS — BP 122/72 | HR 84 | Temp 97.4°F | Resp 16 | Ht 68.0 in | Wt 228.0 lb

## 2018-11-11 DIAGNOSIS — Z0000 Encounter for general adult medical examination without abnormal findings: Secondary | ICD-10-CM

## 2018-11-11 NOTE — Patient Instructions (Signed)
Thomas Allen , Thank you for taking time to come for your Medicare Wellness Visit. I appreciate your ongoing commitment to your health goals. Please review the following plan we discussed and let me know if I can assist you in the future.   Screening recommendations/referrals: Colonoscopy: done 08/07/17 repeat in 2021 Recommended yearly ophthalmology/optometry visit for glaucoma screening and checkup Recommended yearly dental visit for hygiene and checkup  Vaccinations: Influenza vaccine: postponed Pneumococcal vaccine: postponed Tdap vaccine: postponed Shingles vaccine: postponed    Advanced directives: Please bring a copy of your health care power of attorney and living will to the office at your convenience.  Conditions/risks identified: Keep up the great work!  Next appointment: Please follow up in one year for your Medicare Annual Wellness visit.    Preventive Care 77 Years and Older, Male Preventive care refers to lifestyle choices and visits with your health care provider that can promote health and wellness. What does preventive care include?  A yearly physical exam. This is also called an annual well check.  Dental exams once or twice a year.  Routine eye exams. Ask your health care provider how often you should have your eyes checked.  Personal lifestyle choices, including:  Daily care of your teeth and gums.  Regular physical activity.  Eating a healthy diet.  Avoiding tobacco and drug use.  Limiting alcohol use.  Practicing safe sex.  Taking low doses of aspirin every day.  Taking vitamin and mineral supplements as recommended by your health care provider. What happens during an annual well check? The services and screenings done by your health care provider during your annual well check will depend on your age, overall health, lifestyle risk factors, and family history of disease. Counseling  Your health care provider may ask you questions about  your:  Alcohol use.  Tobacco use.  Drug use.  Emotional well-being.  Home and relationship well-being.  Sexual activity.  Eating habits.  History of falls.  Memory and ability to understand (cognition).  Work and work Statistician. Screening  You may have the following tests or measurements:  Height, weight, and BMI.  Blood pressure.  Lipid and cholesterol levels. These may be checked every 5 years, or more frequently if you are over 11 years old.  Skin check.  Lung cancer screening. You may have this screening every year starting at age 45 if you have a 30-pack-year history of smoking and currently smoke or have quit within the past 15 years.  Fecal occult blood test (FOBT) of the stool. You may have this test every year starting at age 64.  Flexible sigmoidoscopy or colonoscopy. You may have a sigmoidoscopy every 5 years or a colonoscopy every 10 years starting at age 82.  Prostate cancer screening. Recommendations will vary depending on your family history and other risks.  Hepatitis C blood test.  Hepatitis B blood test.  Sexually transmitted disease (STD) testing.  Diabetes screening. This is done by checking your blood sugar (glucose) after you have not eaten for a while (fasting). You may have this done every 1-3 years.  Abdominal aortic aneurysm (AAA) screening. You may need this if you are a current or former smoker.  Osteoporosis. You may be screened starting at age 61 if you are at high risk. Talk with your health care provider about your test results, treatment options, and if necessary, the need for more tests. Vaccines  Your health care provider may recommend certain vaccines, such as:  Influenza vaccine. This  is recommended every year.  Tetanus, diphtheria, and acellular pertussis (Tdap, Td) vaccine. You may need a Td booster every 10 years.  Zoster vaccine. You may need this after age 96.  Pneumococcal 13-valent conjugate (PCV13) vaccine.  One dose is recommended after age 47.  Pneumococcal polysaccharide (PPSV23) vaccine. One dose is recommended after age 4. Talk to your health care provider about which screenings and vaccines you need and how often you need them. This information is not intended to replace advice given to you by your health care provider. Make sure you discuss any questions you have with your health care provider. Document Released: 09/17/2015 Document Revised: 05/10/2016 Document Reviewed: 06/22/2015 Elsevier Interactive Patient Education  2017 [place] Prevention in the Home Falls can cause injuries. They can happen to people of all ages. There are many things you can do to make your home safe and to help prevent falls. What can I do on the outside of my home?  Regularly fix the edges of walkways and driveways and fix any cracks.  Remove anything that might make you trip as you walk through a door, such as a raised step or threshold.  Trim any bushes or trees on the path to your home.  Use bright outdoor lighting.  Clear any walking paths of anything that might make someone trip, such as rocks or tools.  Regularly check to see if handrails are loose or broken. Make sure that both sides of any steps have handrails.  Any raised decks and porches should have guardrails on the edges.  Have any leaves, snow, or ice cleared regularly.  Use sand or salt on walking paths during winter.  Clean up any spills in your garage right away. This includes oil or grease spills. What can I do in the bathroom?  Use night lights.  Install grab bars by the toilet and in the tub and shower. Do not use towel bars as grab bars.  Use non-skid mats or decals in the tub or shower.  If you need to sit down in the shower, use a plastic, non-slip stool.  Keep the floor dry. Clean up any water that spills on the floor as soon as it happens.  Remove soap buildup in the tub or shower regularly.  Attach bath  mats securely with double-sided non-slip rug tape.  Do not have throw rugs and other things on the floor that can make you trip. What can I do in the bedroom?  Use night lights.  Make sure that you have a light by your bed that is easy to reach.  Do not use any sheets or blankets that are too big for your bed. They should not hang down onto the floor.  Have a firm chair that has side arms. You can use this for support while you get dressed.  Do not have throw rugs and other things on the floor that can make you trip. What can I do in the kitchen?  Clean up any spills right away.  Avoid walking on wet floors.  Keep items that you use a lot in easy-to-reach places.  If you need to reach something above you, use a strong step stool that has a grab bar.  Keep electrical cords out of the way.  Do not use floor polish or wax that makes floors slippery. If you must use wax, use non-skid floor wax.  Do not have throw rugs and other things on the floor that can make you  trip. What can I do with my stairs?  Do not leave any items on the stairs.  Make sure that there are handrails on both sides of the stairs and use them. Fix handrails that are broken or loose. Make sure that handrails are as long as the stairways.  Check any carpeting to make sure that it is firmly attached to the stairs. Fix any carpet that is loose or worn.  Avoid having throw rugs at the top or bottom of the stairs. If you do have throw rugs, attach them to the floor with carpet tape.  Make sure that you have a light switch at the top of the stairs and the bottom of the stairs. If you do not have them, ask someone to add them for you. What else can I do to help prevent falls?  Wear shoes that:  Do not have high heels.  Have rubber bottoms.  Are comfortable and fit you well.  Are closed at the toe. Do not wear sandals.  If you use a stepladder:  Make sure that it is fully opened. Do not climb a closed  stepladder.  Make sure that both sides of the stepladder are locked into place.  Ask someone to hold it for you, if possible.  Clearly mark and make sure that you can see:  Any grab bars or handrails.  First and last steps.  Where the edge of each step is.  Use tools that help you move around (mobility aids) if they are needed. These include:  Canes.  Walkers.  Scooters.  Crutches.  Turn on the lights when you go into a dark area. Replace any light bulbs as soon as they burn out.  Set up your furniture so you have a clear path. Avoid moving your furniture around.  If any of your floors are uneven, fix them.  If there are any pets around you, be aware of where they are.  Review your medicines with your doctor. Some medicines can make you feel dizzy. This can increase your chance of falling. Ask your doctor what other things that you can do to help prevent falls. This information is not intended to replace advice given to you by your health care provider. Make sure you discuss any questions you have with your health care provider. Document Released: 06/17/2009 Document Revised: 01/27/2016 Document Reviewed: 09/25/2014 Elsevier Interactive Patient Education  2017 [employer].

## 2018-11-11 NOTE — Progress Notes (Signed)
Subjective:   Thomas Allen is a 78 y.o. male who presents for Medicare Annual/Subsequent preventive examination.  Review of Systems:   Cardiac Risk Factors include: advanced age (>6men, >56 women);diabetes mellitus;dyslipidemia;male gender;hypertension;obesity (BMI >30kg/m2)     Objective:    Vitals: BP 122/72 (BP Location: Left Arm, Patient Position: Sitting, Cuff Size: Normal)   Pulse 84   Temp (!) 97.4 F (36.3 C) (Oral)   Resp 16   Ht 5\' 8"  (1.727 m)   Wt 228 lb (103.4 kg)   SpO2 95%   BMI 34.67 kg/m   Body mass index is 34.67 kg/m.  Advanced Directives 11/11/2018 08/07/2017  Does Patient Have a Medical Advance Directive? Yes Yes  Type of Advance Directive Living will;Healthcare Power of [place];Living will  Copy of [place] in Chart? Yes - validated most recent copy scanned in chart (See row information) No - copy requested    Tobacco Social History   Tobacco Use  Smoking Status Former Smoker  . Last attempt to quit: 1976  . Years since quitting: 44.2  Smokeless Tobacco Never Used     Counseling given: Not Answered   Clinical Intake:  Pre-visit preparation completed: No  Pain : No/denies pain     BMI - recorded: 34.67 Nutritional Status: BMI > 30  Obese Nutritional Risks: None Diabetes: Yes CBG done?: No Did pt. bring in CBG monitor from home?: No   Nutrition Risk Assessment:  Has the patient had any N/V/D within the last 2 months?  No  Does the patient have any non-healing wounds?  No  Has the patient had any unintentional weight loss or weight gain?  No   Diabetes:  Is the patient diabetic?  Yes  If diabetic, was a CBG obtained today?  No  Did the patient bring in their glucometer from home?  No  How often do you monitor your CBG's? Once a week.   Financial Strains and Diabetes Management:  Are you having any financial strains with the device, your supplies or your medication? No .  Does  the patient want to be seen by Chronic Care Management for management of their diabetes?  No  Would the patient like to be referred to a Nutritionist or for Diabetic Management?  No   Diabetic Exams:  Diabetic Eye Exam: Completed 02/13/17. Scheduled for appt next week.  Overdue for diabetic eye exam. Pt has been advised about the importance in completing this exam.   Diabetic Foot Exam: Completed 09/18/18.  How often do you need to have someone help you when you read instructions, pamphlets, or other written materials from your doctor or pharmacy?: 1 - Never  Interpreter Needed?: No  Information entered by :: Clemetine Marker LPN  Past Medical History:  Diagnosis Date  . Arthritis   . Cancer ([place])   . Diabetes mellitus without complication ([place])   . GERD (gastroesophageal reflux disease)   . Haglund's deformity   . History of kidney stones   . Hyperlipidemia   . Hypertension   . Prostate CA ([place])   . Sleep apnea    Past Surgical History:  Procedure Laterality Date  . APPENDECTOMY    . COLONOSCOPY  2008   benign polyps  . COLONOSCOPY WITH PROPOFOL N/A 08/07/2017   Procedure: COLONOSCOPY WITH PROPOFOL;  Surgeon: Lollie Sails, MD;  Location: [facility] ENDOSCOPY;  Service: Endoscopy;  Laterality: N/A;  . ESOPHAGOGASTRODUODENOSCOPY (EGD) WITH PROPOFOL N/A 08/07/2017   Procedure:  ESOPHAGOGASTRODUODENOSCOPY (EGD) WITH PROPOFOL;  Surgeon: Lollie Sails, MD;  Location: [facility] ENDOSCOPY;  Service: Endoscopy;  Laterality: N/A;  . JOINT REPLACEMENT Right 2009  . JOINT REPLACEMENT Left 2015  . lithopexy    . PROSTATE BIOPSY  2012   malignant  . REPLACEMENT TOTAL KNEE Bilateral    Family History  Problem Relation Age of Onset  . Diabetes Mother   . Colon cancer Mother   . Colon cancer Father   . Prostate cancer Neg Hx   . Bladder Cancer Neg Hx   . Kidney cancer Neg Hx    Social History   Socioeconomic History  . Marital status: Married    Spouse name: Not on file  . Number of  children: 3  . Years of education: Not on file  . 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 Financial resource strain: Not hard at all  . Food insecurity:    Worry: Never true    Inability: Never true  . Transportation needs:    Medical: No    Non-medical: No  Tobacco Use  . Smoking status: Former Smoker    Last attempt to quit: 1976    Years since quitting: 44.2  . Smokeless tobacco: Never Used  Substance and Sexual Activity  . Alcohol use: Yes    Alcohol/week: 34.0 standard drinks    Types: 20 Standard drinks or equivalent, 14 Shots of liquor per week  . Drug use: No  . Sexual activity: Not on file  Lifestyle  . Physical activity:    Days per week: 4 days    Minutes per session: 30 min  . Stress: Only a little  Relationships  . Social connections:    Talks on phone: More than three times a week    Gets together: Once a week    Attends religious service: More than 4 times per year    Active member of club or organization: No    Attends meetings of clubs or organizations: Never    Relationship status: Married  Other Topics Concern  . Not on file  Social History Narrative  . Not on file    Outpatient Encounter Medications as of 11/11/2018  Medication Sig  . aspirin 325 MG tablet Take by mouth. Pt does not take daily  . empagliflozin (JARDIANCE) 10 MG TABS tablet Take 10 mg by mouth daily.  Marland Kitchen losartan (COZAAR) 25 MG tablet Take 1 tablet (25 mg total) by mouth daily.  . metFORMIN (GLUCOPHAGE) 1000 MG tablet Take 1 tablet (1,000 mg total) by mouth 2 (two) times daily.  . Multiple Vitamins-Minerals (MULTIVITAMIN WITH MINERALS) tablet Take 1 tablet by mouth daily.  Marland Kitchen omeprazole (PRILOSEC) 20 MG capsule Take 1 capsule (20 mg total) by mouth 2 (two) times daily before a meal.  . pravastatin (PRAVACHOL) 20 MG tablet Take 1 tablet (20 mg total) by mouth daily.  .  [DISCONTINUED] Garlic 448 MG TABS Take by mouth.   No facility-administered encounter medications on file as of 11/11/2018.     Activities of Daily Living In your present state of health, do you have any difficulty performing the following activities: 11/11/2018 09/18/2018  Hearing? N N  Comment declines hearing aids -  Vision? N N  Comment wears glasses -  Difficulty concentrating or making decisions? N N  Walking or climbing stairs? N N  Dressing or bathing?  N N  Doing errands, shopping? N N  Preparing Food and eating ? N -  Using the Toilet? N -  In the past six months, have you accidently leaked urine? N -  Do you have problems with loss of bowel control? N -  Managing your Medications? N -  Managing your Finances? N -  Housekeeping or managing your Housekeeping? N -  Some recent data might be hidden    Patient Care Team: Glean Hess, MD as PCP - General (Internal Medicine) Lollie Sails, MD as Consulting Physician (Gastroenterology) Anell Barr, OD (Optometry) Rutherford Limerick, MD as Attending Physician (Orthopedic Surgery)   Assessment:   This is a routine wellness examination for Thomas Allen.  Exercise Activities and Dietary recommendations Current Exercise Habits: Home exercise routine, Type of exercise: treadmill;Other - see comments(balance exercises), Time (Minutes): 30, Frequency (Times/Week): 4, Weekly Exercise (Minutes/Week): 120, Intensity: Moderate, Exercise limited by: orthopedic condition(s)  Goals    . Patient Stated     Maintain health        Fall Risk Fall Risk  11/11/2018 11/04/2018 09/18/2018 07/22/2018 08/23/2017  Falls in the past year? 0 0 0 0 No  Comment - - - [place]i Telephone Survey: data to providers prior to load -  Number falls in past yr: 0 0 0 - -  Injury with Fall? 0 0 0 - -  Follow up Falls prevention discussed Falls evaluation completed Falls evaluation completed - -   FALL RISK PREVENTION PERTAINING TO THE HOME:  Any  stairs in or around the home? No  If so, do they handrails? No   Home free of loose throw rugs in walkways, pet beds, electrical cords, etc? Yes  Adequate lighting in your home to reduce risk of falls? Yes   ASSISTIVE DEVICES UTILIZED TO PREVENT FALLS:  Life alert? No  Use of a cane, walker or w/c? No  Grab bars in the bathroom? Yes  Shower chair or bench in shower? No  Elevated toilet seat or a handicapped toilet? No   DME ORDERS:  DME order needed?  No   TIMED UP AND GO:  Was the test performed? Yes .  Length of time to ambulate 10 feet: 6 sec.   GAIT:  Appearance of gait: Gait stead-fast and without the use of an assistive device.   Education: Fall risk prevention has been discussed.  Intervention(s) required? No    Depression Screen PHQ 2/9 Scores 11/11/2018 11/04/2018 09/18/2018 08/23/2017  PHQ - 2 Score 0 0 0 0    Cognitive Function     6CIT Screen 11/11/2018 08/23/2017 08/07/2016  What Year? 0 points 0 points 0 points  What month? 0 points 0 points 0 points  What time? 0 points 0 points 0 points  Count back from 20 0 points 0 points 0 points  Months in reverse 0 points 0 points 0 points  Repeat phrase 0 points 4 points 0 points  Total Score 0 4 0     There is no immunization history on file for this patient.  Qualifies for Shingles Vaccine? Yes . Due for Shingrix. Education has been provided regarding the importance of this vaccine. Pt has been advised to call insurance company to determine out of pocket expense. Advised may also receive vaccine at local pharmacy or Health Dept. Verbalized acceptance and understanding.  Tdap: Although this vaccine is not a covered service during a Wellness Exam, does the patient still wish to receive this vaccine today?  No .  Education has been provided regarding the importance of this vaccine. Advised may receive this vaccine at local pharmacy or Health Dept. Aware to provide a copy of the vaccination record if obtained from  local pharmacy or Health Dept. Verbalized acceptance and understanding.  Flu Vaccine: Due for Flu vaccine. Does the patient want to receive this vaccine today?  No . Education has been provided regarding the importance of this vaccine but still declined. Advised may receive this vaccine at local pharmacy or Health Dept. Aware to provide a copy of the vaccination record if obtained from local pharmacy or Health Dept. Verbalized acceptance and understanding.  Pneumococcal Vaccine: Due for Pneumococcal vaccine. Does the patient want to receive this vaccine today?  No . Education has been provided regarding the importance of this vaccine but still declined. Advised may receive this vaccine at local pharmacy or Health Dept. Aware to provide a copy of the vaccination record if obtained from local pharmacy or Health Dept. Verbalized acceptance and understanding.   Screening Tests Health Maintenance  Topic Date Due  . OPHTHALMOLOGY EXAM  02/13/2018  . INFLUENZA VACCINE  09/05/2023 (Originally 04/04/2018)  . TETANUS/TDAP  09/05/2023 (Originally 11/21/1959)  . PNA vac Low Risk Adult (1 of 2 - PCV13) 09/05/2023 (Originally 11/22/2006)  . HEMOGLOBIN A1C  03/19/2019  . FOOT EXAM  09/19/2019   Cancer Screenings:  Colorectal Screening: Completed 08/07/17. Repeat every 3 years.  Lung Cancer Screening: (Low Dose CT Chest recommended if Age 27-80 years, 30 pack-year currently smoking OR have quit w/in 15years.) does not qualify.    Additional Screening:  Hepatitis C Screening: no longer required  Vision Screening: Recommended annual ophthalmology exams for early detection of glaucoma and other disorders of the eye. Is the patient up to date with their annual eye exam?  Yes  Who is the provider or what is the name of the office in which the pt attends annual eye exams? Dr. Ellin Mayhew  Dental Screening: Recommended annual dental exams for proper oral hygiene  Community Resource Referral:  CRR required this  visit?  No       Plan:    I have personally reviewed and addressed the Medicare Annual Wellness questionnaire and have noted the following in the patient's chart:  A. Medical and social history B. Use of alcohol, tobacco or illicit drugs  C. Current medications and supplements D. Functional ability and status E.  Nutritional status F.  Physical activity G. Advance directives H. List of other physicians I.  Hospitalizations, surgeries, and ER visits in previous 12 months J.  [place] such as hearing and vision if needed, cognitive and depression L. Referrals and appointments   In addition, I have reviewed and discussed with patient certain preventive protocols, quality metrics, and best practice recommendations. A written personalized care plan for preventive services as well as general preventive health recommendations were provided to patient.   Signed,  Clemetine Marker, LPN Nurse Health Advisor   Nurse Notes:

## 2018-11-12 DIAGNOSIS — H40003 Preglaucoma, unspecified, bilateral: Secondary | ICD-10-CM | POA: Diagnosis not present

## 2018-11-12 DIAGNOSIS — E119 Type 2 diabetes mellitus without complications: Secondary | ICD-10-CM | POA: Diagnosis not present

## 2018-11-12 DIAGNOSIS — H2513 Age-related nuclear cataract, bilateral: Secondary | ICD-10-CM | POA: Diagnosis not present

## 2018-11-21 ENCOUNTER — Other Ambulatory Visit: Payer: Self-pay | Admitting: Internal Medicine

## 2018-11-21 DIAGNOSIS — E119 Type 2 diabetes mellitus without complications: Secondary | ICD-10-CM

## 2018-12-27 ENCOUNTER — Other Ambulatory Visit: Payer: Self-pay | Admitting: Internal Medicine

## 2018-12-27 DIAGNOSIS — K219 Gastro-esophageal reflux disease without esophagitis: Secondary | ICD-10-CM

## 2019-01-20 ENCOUNTER — Ambulatory Visit: Payer: Medicare Other | Admitting: Internal Medicine

## 2019-02-25 ENCOUNTER — Encounter: Payer: Self-pay | Admitting: Internal Medicine

## 2019-02-25 ENCOUNTER — Ambulatory Visit (INDEPENDENT_AMBULATORY_CARE_PROVIDER_SITE_OTHER): Payer: Medicare Other | Admitting: Internal Medicine

## 2019-02-25 ENCOUNTER — Other Ambulatory Visit: Payer: Self-pay

## 2019-02-25 VITALS — BP 122/68 | HR 74 | Ht 68.0 in | Wt 215.0 lb

## 2019-02-25 DIAGNOSIS — Z8546 Personal history of malignant neoplasm of prostate: Secondary | ICD-10-CM | POA: Diagnosis not present

## 2019-02-25 DIAGNOSIS — E119 Type 2 diabetes mellitus without complications: Secondary | ICD-10-CM

## 2019-02-25 DIAGNOSIS — G5603 Carpal tunnel syndrome, bilateral upper limbs: Secondary | ICD-10-CM | POA: Diagnosis not present

## 2019-02-25 DIAGNOSIS — E1169 Type 2 diabetes mellitus with other specified complication: Secondary | ICD-10-CM | POA: Diagnosis not present

## 2019-02-25 DIAGNOSIS — E785 Hyperlipidemia, unspecified: Secondary | ICD-10-CM

## 2019-02-25 DIAGNOSIS — R61 Generalized hyperhidrosis: Secondary | ICD-10-CM | POA: Diagnosis not present

## 2019-02-25 DIAGNOSIS — R030 Elevated blood-pressure reading, without diagnosis of hypertension: Secondary | ICD-10-CM

## 2019-02-25 MED ORDER — METFORMIN HCL 1000 MG PO TABS: 1000.0000 mg | ORAL_TABLET | Freq: Two times a day (BID) | ORAL | 1 refills | Status: DC

## 2019-02-25 MED ORDER — LOSARTAN POTASSIUM 25 MG PO TABS: 25.0000 mg | ORAL_TABLET | Freq: Every day | ORAL | 1 refills | Status: DC

## 2019-02-25 MED ORDER — PRAVASTATIN SODIUM 20 MG PO TABS: 20.0000 mg | ORAL_TABLET | Freq: Every day | ORAL | 1 refills | Status: DC

## 2019-02-25 NOTE — Progress Notes (Signed)
Date:  02/25/2019   Name:  Thomas Allen   DOB:  1941/03/11   MRN:  [id number]   Chief Complaint: Diabetes (4 month follow up. Patient still taking Jardiance and Metformin BID.), Hypertension (Out of losartan X 6 weeks. Wants to stop since BP good today, and losing weight.), and Hyperlipidemia (Out of Pravastatin X 6 weeks. Wants to check labs today and see if can stay of meds since losing weight.)  Diabetes He presents for his follow-up diabetic visit. He has type 2 diabetes mellitus. His disease course has been stable. Pertinent negatives for hypoglycemia include no headaches or tremors. Pertinent negatives for diabetes include no chest pain, no fatigue, no polydipsia, no polyuria and no weakness. Current diabetic treatment includes oral agent (dual therapy). He is compliant with treatment most of the time. His weight is stable. An ACE inhibitor/angiotensin II receptor blocker is being taken.  Hypertension The problem is controlled. Pertinent negatives include no chest pain, headaches, palpitations or shortness of breath. Past treatments include angiotensin blockers. The current treatment provides significant improvement.  Hyperlipidemia This is a chronic problem. Pertinent negatives include no chest pain or shortness of breath. Treatments tried: previously on statins - ran out 6 weeks ago.  He agrees to resume both pravachol and losartan. CTS - he is waking with tingling in both hands.  It does not bother him during the day.  It resolves with movement and shaking.  He has seen Ortho for injection in a trigger finger on the right hand. Prostate CA - having severe night sweats for the past few years .  Treated for prostate cancer about 5 years ago.  He has never had a testosterone level and he is advised that supplementation is not recommended in his situation.  Lab Results  Component Value Date   HGBA1C 6.4 (H) 09/18/2018   Lab Results  Component Value Date   CREATININE 0.87 09/18/2018   BUN 14 09/18/2018   NA 136 09/18/2018   K 4.5 09/18/2018   CL 99 09/18/2018   CO2 21 09/18/2018     Review of Systems  Constitutional: Positive for diaphoresis (severe night sweats interupt sleep) and unexpected weight change (lost about 10 lbs since last year). Negative for appetite change and fatigue.  Eyes: Negative for visual disturbance.  Respiratory: Negative for cough, shortness of breath and wheezing.   Cardiovascular: Negative for chest pain, palpitations and leg swelling.  Gastrointestinal: Negative for abdominal pain and blood in stool.  Endocrine: Negative for polydipsia and polyuria.  Genitourinary: Negative for dysuria and hematuria.  Musculoskeletal: Positive for arthralgias.  Skin: Negative for color change and rash.  Neurological: Negative for tremors, weakness, numbness and headaches.  Psychiatric/Behavioral: Positive for sleep disturbance. Negative for dysphoric mood.    Patient Active Problem List   Diagnosis Date Noted  . Hand pain, right 08/23/2017  . Erectile dysfunction following radiation therapy 06/26/2017  . Hyperlipidemia associated with type 2 diabetes mellitus ([place]) 03/12/2017  . Asymptomatic PVCs 02/05/2017  . Intractable hiccups 02/05/2017  . Gastroesophageal reflux disease 02/05/2017  . Alcohol use disorder 01/19/2016  . Type 2 diabetes mellitus without complication, without long-term current use of insulin ([place]) 07/26/2015  . Carpal tunnel syndrome 03/30/2015  . Elevated blood pressure, situational 03/30/2015  . Abnormal LFTs 03/30/2015  . H/O adenomatous polyp of colon 03/30/2015  . Tobacco use disorder, moderate, in sustained remission 03/30/2015  . History of prostate cancer 09/02/2013  . Posterior calcaneal exostosis 02/26/2013  . Calcific Achilles  tendinitis 02/26/2013    Allergies  Allergen Reactions  . Ace Inhibitors Cough    Past Surgical History:  Procedure Laterality Date  . APPENDECTOMY    . COLONOSCOPY  2008   benign  polyps  . COLONOSCOPY WITH PROPOFOL N/A 08/07/2017   Procedure: COLONOSCOPY WITH PROPOFOL;  Surgeon: Lollie Sails, MD;  [facility] ENDOSCOPY;  Service: Endoscopy;  Laterality: N/A;  . ESOPHAGOGASTRODUODENOSCOPY (EGD) WITH PROPOFOL N/A 08/07/2017   Procedure: ESOPHAGOGASTRODUODENOSCOPY (EGD) WITH PROPOFOL;  Surgeon: Lollie Sails, MD;  Location: [facility] ENDOSCOPY;  Service: Endoscopy;  Laterality: N/A;  . JOINT REPLACEMENT Right 2009  . JOINT REPLACEMENT Left 2015  . lithopexy    . PROSTATE BIOPSY  2012   malignant  . REPLACEMENT TOTAL KNEE Bilateral     Social History   Tobacco Use  . Smoking status: Former Smoker    Quit date: 1976    Years since quitting: 44.5  . Smokeless tobacco: Never Used  Substance Use Topics  . Alcohol use: Yes    Alcohol/week: 34.0 standard drinks    Types: 20 Standard drinks or equivalent, 14 Shots of liquor per week  . Drug use: No     Medication list has been reviewed and updated.  Current Meds  Medication Sig  . aspirin 325 MG tablet Take by mouth. Pt does not take daily  . JARDIANCE 10 MG TABS tablet TAKE 1 TABLET BY MOUTH DAILY  . metFORMIN (GLUCOPHAGE) 1000 MG tablet Take 1 tablet (1,000 mg total) by mouth 2 (two) times daily.  . Multiple Vitamins-Minerals (MULTIVITAMIN WITH MINERALS) tablet Take 1 tablet by mouth daily.    PHQ 2/9 Scores 11/11/2018 11/04/2018 09/18/2018 08/23/2017  PHQ - 2 Score 0 0 0 0    BP Readings from Last 3 Encounters:  02/25/19 122/68  11/11/18 122/72  11/04/18 128/80    Physical Exam Vitals signs and nursing note reviewed.  Constitutional:      General: He is not in acute distress.    Appearance: He is well-developed.  HENT:     Head: Normocephalic and atraumatic.  Neck:     Musculoskeletal: Normal range of motion.  Cardiovascular:     Rate and Rhythm: Normal rate and regular rhythm.     Pulses: Normal pulses.     Heart sounds: No murmur.  Pulmonary:     Effort: Pulmonary effort is normal. No  respiratory distress.     Breath sounds: No wheezing or rhonchi.  Musculoskeletal:     Lumbar back: He exhibits tenderness. He exhibits no spasm.     Right lower leg: No edema.     Left lower leg: No edema.     Comments: Phalen's positive on right Otherwise negative Phalen's on left and Tinel's bilaterally  Lymphadenopathy:     Cervical: No cervical adenopathy.  Skin:    General: Skin is warm and dry.     Capillary Refill: Capillary refill takes less than 2 seconds.     Findings: No rash.     Comments: Multiple scaly lesion on forehead, mild erythema  Neurological:     Mental Status: He is alert and oriented to person, place, and time.     Motor: No weakness.  Psychiatric:        Attention and Perception: Attention normal.        Mood and Affect: Mood normal.        Behavior: Behavior normal.        Thought Content: Thought content normal.  Wt Readings from Last 3 Encounters:  02/25/19 215 lb (97.5 kg)  11/11/18 228 lb (103.4 kg)  11/04/18 218 lb (98.9 kg)    BP 122/68   Pulse 74   Ht 5\' 8"  (1.727 m)   Wt 215 lb (97.5 kg)   SpO2 96%   BMI 32.69 kg/m   Assessment and Plan: 1. Type 2 diabetes mellitus without complication, without long-term current use of insulin (HCC) Stable, excellent weight loss Continue diet and exercise Resume ARB - Hemoglobin O2V - Basic metabolic panel - metFORMIN (GLUCOPHAGE) 1000 MG tablet; Take 1 tablet (1,000 mg total) by mouth 2 (two) times daily.  Dispense: 180 tablet; Refill: 1 - losartan (COZAAR) 25 MG tablet; Take 1 tablet (25 mg total) by mouth daily.  Dispense: 90 tablet; Refill: 1  2. Elevated blood pressure, situational controlled  3. Hyperlipidemia associated with type 2 diabetes mellitus (HCC) Resume statin - Lipid panel - pravastatin (PRAVACHOL) 20 MG tablet; Take 1 tablet (20 mg total) by mouth daily.  Dispense: 90 tablet; Refill: 1  4. Chronic night sweats Will check testosterone level s/p prostate cancer tx with  XRT He can discuss with Rad Onc at next visit - Testosterone  5. Bilateral carpal tunnel syndrome Recommend splints while sleeping Can see Ortho if worsening for consideration of cortisone injection   Partially dictated using Editor, commissioning. Any errors are unintentional.  Halina Maidens, MD [facility]  02/25/2019

## 2019-02-26 LAB — HEMOGLOBIN A1C
Est. average glucose Bld gHb Est-mCnc: 117 mg/dL
Hgb A1c MFr Bld: 5.7 % — ABNORMAL HIGH (ref 4.8–5.6)

## 2019-02-26 LAB — BASIC METABOLIC PANEL
BUN/Creatinine Ratio: 15 (ref 10–24)
BUN: 12 mg/dL (ref 8–27)
CO2: 23 mmol/L (ref 20–29)
Calcium: 9.6 mg/dL (ref 8.6–10.2)
Chloride: 102 mmol/L (ref 96–106)
Creatinine, Ser: 0.82 mg/dL (ref 0.76–1.27)
GFR calc Af Amer: 99 mL/min/{1.73_m2} (ref >59)
GFR calc non Af Amer: 85 mL/min/{1.73_m2} (ref >59)
Glucose: 115 mg/dL — ABNORMAL HIGH (ref 65–99)
Potassium: 4.7 mmol/L (ref 3.5–5.2)
Sodium: 141 mmol/L (ref 134–144)

## 2019-02-26 LAB — LIPID PANEL
Chol/HDL Ratio: 3.3 ratio (ref 0.0–5.0)
Cholesterol, Total: 164 mg/dL (ref 100–199)
HDL: 50 mg/dL (ref >39)
LDL Calculated: 82 mg/dL (ref 0–99)
Triglycerides: 158 mg/dL — ABNORMAL HIGH (ref 0–149)
VLDL Cholesterol Cal: 32 mg/dL (ref 5–40)

## 2019-02-26 LAB — TESTOSTERONE: Testosterone: 148 ng/dL — ABNORMAL LOW (ref 264–916)

## 2019-04-14 DIAGNOSIS — C61 Malignant neoplasm of prostate: Secondary | ICD-10-CM | POA: Diagnosis not present

## 2019-06-13 ENCOUNTER — Other Ambulatory Visit: Payer: Self-pay | Admitting: Internal Medicine

## 2019-06-13 DIAGNOSIS — E119 Type 2 diabetes mellitus without complications: Secondary | ICD-10-CM

## 2019-06-20 ENCOUNTER — Telehealth: Payer: Self-pay

## 2019-06-20 NOTE — Telephone Encounter (Signed)
Patient called saying his wife is stating his breathe smells fruity and is getting worse. She is concerned about his kidney function.  Spoke with Dr. Army Melia. Informed patient kidney function and a1c was good in June. Told we see him in 10 days for a follow up. We will check labs but could be coming from a tooth.  He did say he is having tooth pain on and off and gargling with listerine to help.  Patient verbalized understanding and will com esee [place] in 10 days at his appt.   CM

## 2019-07-01 ENCOUNTER — Other Ambulatory Visit: Payer: Self-pay

## 2019-07-01 ENCOUNTER — Encounter: Payer: Self-pay | Admitting: Internal Medicine

## 2019-07-01 ENCOUNTER — Ambulatory Visit (INDEPENDENT_AMBULATORY_CARE_PROVIDER_SITE_OTHER): Payer: Medicare Other | Admitting: Internal Medicine

## 2019-07-01 VITALS — BP 112/60 | HR 92 | Ht 68.0 in | Wt 203.0 lb

## 2019-07-01 DIAGNOSIS — E785 Hyperlipidemia, unspecified: Secondary | ICD-10-CM

## 2019-07-01 DIAGNOSIS — E118 Type 2 diabetes mellitus with unspecified complications: Secondary | ICD-10-CM | POA: Diagnosis not present

## 2019-07-01 DIAGNOSIS — E1169 Type 2 diabetes mellitus with other specified complication: Secondary | ICD-10-CM | POA: Diagnosis not present

## 2019-07-01 MED ORDER — PRAVASTATIN SODIUM 20 MG PO TABS: 20.0000 mg | ORAL_TABLET | Freq: Every day | ORAL | 1 refills | Status: DC

## 2019-07-01 MED ORDER — LOSARTAN POTASSIUM 25 MG PO TABS: 25.0000 mg | ORAL_TABLET | Freq: Every day | ORAL | 1 refills | Status: DC

## 2019-07-01 NOTE — Progress Notes (Signed)
Date:  07/01/2019   Name:  Thomas Allen   DOB:  1941-07-06   MRN:  [id number]   Chief Complaint: Diabetes (4 month follow up. Walking 2 miles every morning now and no longer eating out. ) and Hypertension  Diabetes He presents for his follow-up diabetic visit. He has type 2 diabetes mellitus. His disease course has been improving. There are no hypoglycemic associated symptoms. Pertinent negatives for hypoglycemia include no headaches or tremors. Pertinent negatives for diabetes include no chest pain, no fatigue, no polydipsia and no polyuria. Symptoms are stable. Current diabetic treatment includes oral agent (dual therapy) (jardiance and metformin). He is compliant with treatment all of the time. His weight is decreasing steadily. He participates in exercise daily. An ACE inhibitor/angiotensin II receptor blocker is being taken. Eye exam is not current.  Hyperlipidemia This is a chronic problem. The problem is controlled. Pertinent negatives include no chest pain or shortness of breath. Current antihyperlipidemic treatment includes statins (pt ran out when it was not in stock at the pharmacy and never went back to pick it up). The current treatment provides significant improvement of lipids.   Lab Results  Component Value Date   HGBA1C 5.7 (H) 02/25/2019    Review of Systems  Constitutional: Negative for appetite change, fatigue and unexpected weight change.  Eyes: Negative for visual disturbance.  Respiratory: Negative for cough, shortness of breath and wheezing.   Cardiovascular: Negative for chest pain, palpitations and leg swelling.  Gastrointestinal: Negative for abdominal pain and blood in stool.  Endocrine: Negative for polydipsia and polyuria.  Genitourinary: Negative for dysuria and hematuria.  Skin: Negative for color change and rash.  Neurological: Negative for tremors, numbness and headaches.  Psychiatric/Behavioral: Negative for dysphoric mood.    Patient Active  Problem List   Diagnosis Date Noted  . Hand pain, right 08/23/2017  . Erectile dysfunction following radiation therapy 06/26/2017  . Hyperlipidemia associated with type 2 diabetes mellitus ([place]) 03/12/2017  . Asymptomatic PVCs 02/05/2017  . Intractable hiccups 02/05/2017  . Gastroesophageal reflux disease 02/05/2017  . Alcohol use disorder 01/19/2016  . Type 2 diabetes mellitus without complication, without long-term current use of insulin ([place]) 07/26/2015  . Carpal tunnel syndrome 03/30/2015  . Elevated blood pressure, situational 03/30/2015  . Abnormal LFTs 03/30/2015  . H/O adenomatous polyp of colon 03/30/2015  . Tobacco use disorder, moderate, in sustained remission 03/30/2015  . History of prostate cancer 09/02/2013  . Posterior calcaneal exostosis 02/26/2013  . Calcific Achilles tendinitis 02/26/2013    Allergies  Allergen Reactions  . Ace Inhibitors Cough    Past Surgical History:  Procedure Laterality Date  . APPENDECTOMY    . COLONOSCOPY  2008   benign polyps  . COLONOSCOPY WITH PROPOFOL N/A 08/07/2017   Procedure: COLONOSCOPY WITH PROPOFOL;  Surgeon: Lollie Sails, MD;  Location: [facility] ENDOSCOPY;  Service: Endoscopy;  Laterality: N/A;  . ESOPHAGOGASTRODUODENOSCOPY (EGD) WITH PROPOFOL N/A 08/07/2017   Procedure: ESOPHAGOGASTRODUODENOSCOPY (EGD) WITH PROPOFOL;  Surgeon: Lollie Sails, MD;  Location: [facility] ENDOSCOPY;  Service: Endoscopy;  Laterality: N/A;  . JOINT REPLACEMENT Right 2009  . JOINT REPLACEMENT Left 2015  . lithopexy    . PROSTATE BIOPSY  2012   malignant  . REPLACEMENT TOTAL KNEE Bilateral     Social History   Tobacco Use  . Smoking status: Former Smoker    Quit date: 1976    Years since quitting: 44.8  . Smokeless tobacco: Never Used  Substance Use Topics  .  Alcohol use: Yes    Alcohol/week: 34.0 standard drinks    Types: 20 Standard drinks or equivalent, 14 Shots of liquor per week  . Drug use: No     Medication list has been  reviewed and updated.  Current Meds  Medication Sig  . aspirin 325 MG tablet Take by mouth. Pt does not take daily  . JARDIANCE 10 MG TABS tablet TAKE 1 TABLET BY MOUTH DAILY  . losartan (COZAAR) 25 MG tablet Take 1 tablet (25 mg total) by mouth daily.  . metFORMIN (GLUCOPHAGE) 1000 MG tablet Take 1 tablet (1,000 mg total) by mouth 2 (two) times daily.  . Multiple Vitamins-Minerals (MULTIVITAMIN WITH MINERALS) tablet Take 1 tablet by mouth daily.    PHQ 2/9 Scores 07/01/2019 11/11/2018 11/04/2018 09/18/2018  PHQ - 2 Score 0 0 0 0    BP Readings from Last 3 Encounters:  07/01/19 112/60  02/25/19 122/68  11/11/18 122/72    Physical Exam Vitals signs and nursing note reviewed.  Constitutional:      General: He is not in acute distress.    Appearance: Normal appearance. He is well-developed.  HENT:     Head: Normocephalic and atraumatic.  Neck:     Musculoskeletal: Normal range of motion.  Cardiovascular:     Rate and Rhythm: Normal rate and regular rhythm.     Heart sounds: No murmur.  Pulmonary:     Effort: Pulmonary effort is normal. No respiratory distress.     Breath sounds: No wheezing or rhonchi.  Musculoskeletal:     Right lower leg: No edema.     Left lower leg: No edema.  Lymphadenopathy:     Cervical: No cervical adenopathy.  Skin:    General: Skin is warm and dry.     Capillary Refill: Capillary refill takes less than 2 seconds.     Findings: No rash.  Neurological:     General: No focal deficit present.     Mental Status: He is alert and oriented to person, place, and time.  Psychiatric:        Behavior: Behavior normal.        Thought Content: Thought content normal.     Wt Readings from Last 3 Encounters:  07/01/19 203 lb (92.1 kg)  02/25/19 215 lb (97.5 kg)  11/11/18 228 lb (103.4 kg)    BP 112/60   Pulse 92   Ht 5\' 8"  (1.727 m)   Wt 203 lb (92.1 kg)   SpO2 97%   BMI 30.87 kg/m   Assessment and Plan: 1. Type II diabetes mellitus with  complication (HCC) Clinically stable by exam and report without s/s of hypoglycemia. DM complicated by lipids. Tolerating medications jardiance and metformin 1000 mg bid well without side effects or other concerns.  A1C much improved last visit with jardiance and weight loss - consider lowering the dose of metformin - Hemoglobin A1c - Comprehensive metabolic panel - losartan (COZAAR) 25 MG tablet; Take 1 tablet (25 mg total) by mouth daily.  Dispense: 90 tablet; Refill: 1  2. Hyperlipidemia associated with type 2 diabetes mellitus ([place]) Resume pravastatin - inadvertently stopped by patient when pharmacy did not have it immediately available. - pravastatin (PRAVACHOL) 20 MG tablet; Take 1 tablet (20 mg total) by mouth daily.  Dispense: 90 tablet; Refill: 1     Partially dictated using Editor, commissioning. Any errors are unintentional.  Halina Maidens, MD [facility]  07/01/2019

## 2019-07-02 LAB — COMPREHENSIVE METABOLIC PANEL
ALT: 25 IU/L (ref 0–44)
AST: 17 IU/L (ref 0–40)
Albumin/Globulin Ratio: 2 (ref 1.2–2.2)
Albumin: 4.3 g/dL (ref 3.7–4.7)
Alkaline Phosphatase: 61 IU/L (ref 39–117)
BUN/Creatinine Ratio: 21 (ref 10–24)
BUN: 19 mg/dL (ref 8–27)
Bilirubin Total: 0.4 mg/dL (ref 0.0–1.2)
CO2: 23 mmol/L (ref 20–29)
Calcium: 10 mg/dL (ref 8.6–10.2)
Chloride: 104 mmol/L (ref 96–106)
Creatinine, Ser: 0.9 mg/dL (ref 0.76–1.27)
GFR calc Af Amer: 95 mL/min/{1.73_m2} (ref >59)
GFR calc non Af Amer: 82 mL/min/{1.73_m2} (ref >59)
Globulin, Total: 2.1 g/dL (ref 1.5–4.5)
Glucose: 113 mg/dL — ABNORMAL HIGH (ref 65–99)
Potassium: 4.9 mmol/L (ref 3.5–5.2)
Sodium: 139 mmol/L (ref 134–144)
Total Protein: 6.4 g/dL (ref 6.0–8.5)

## 2019-07-02 LAB — HEMOGLOBIN A1C
Est. average glucose Bld gHb Est-mCnc: 126 mg/dL
Hgb A1c MFr Bld: 6 % — ABNORMAL HIGH (ref 4.8–5.6)

## 2019-07-11 ENCOUNTER — Telehealth: Payer: Self-pay

## 2019-07-11 NOTE — Telephone Encounter (Signed)
Patient called stating he is currently in the donut hole. His Jardiance for the next two months is going to cost him $464 instead of the $110 he is used to. The price will go back to $110 in January.   Patient asked if he can continue on metformin BID until January and then stop Jardiance UNTIL January because he cannot afford this price. In January he will start back on Jardiance 10 mg daily.  Spoke with Dr. Army Melia and she agreed. Informed patient this plan of treatment is okay and he verbalized understanding.  Benedict Needy, CMA

## 2019-08-05 ENCOUNTER — Ambulatory Visit: Payer: Medicare Other

## 2019-08-07 DIAGNOSIS — G5602 Carpal tunnel syndrome, left upper limb: Secondary | ICD-10-CM | POA: Diagnosis not present

## 2019-08-07 DIAGNOSIS — M65342 Trigger finger, left ring finger: Secondary | ICD-10-CM | POA: Diagnosis not present

## 2019-11-17 ENCOUNTER — Ambulatory Visit: Payer: Medicare Other

## 2019-11-24 ENCOUNTER — Ambulatory Visit: Payer: Medicare Other | Attending: Internal Medicine

## 2019-11-24 DIAGNOSIS — Z23 Encounter for immunization: Secondary | ICD-10-CM

## 2019-11-24 NOTE — Progress Notes (Signed)
   Covid-19 Vaccination Clinic  Name:  Thomas Allen    MRN: [id number] DOB: 08-Mar-1941  11/24/2019  Thomas Allen was observed post Covid-19 immunization for 15 minutes without incident. He was provided with Vaccine Information Sheet and instruction to access the V-Safe system.   Thomas Allen was instructed to call 911 with any severe reactions post vaccine: Marland Kitchen Difficulty breathing  . Swelling of face and throat  . A fast heartbeat  . A bad rash all over body  . Dizziness and weakness   Immunizations Administered    Name Date Dose VIS Date Route   Pfizer COVID-19 Vaccine 11/24/2019 12:10 PM 0.3 mL 08/15/2019 Intramuscular   Manufacturer: [place]   Lot: [id number]   [place]: [id number]

## 2019-11-26 ENCOUNTER — Ambulatory Visit (INDEPENDENT_AMBULATORY_CARE_PROVIDER_SITE_OTHER): Payer: Medicare Other

## 2019-11-26 VITALS — BP 105/54 | Ht 68.0 in | Wt 194.0 lb

## 2019-11-26 DIAGNOSIS — Z0000 Encounter for general adult medical examination without abnormal findings: Secondary | ICD-10-CM

## 2019-11-26 NOTE — Progress Notes (Signed)
Subjective:   Thomas Allen is a 79 y.o. male who presents for Medicare Annual/Subsequent preventive examination.  Virtual Visit via Telephone Note  I connected with Thomas Allen on 11/26/19 at  8:40 AM EDT by telephone and verified that I am speaking with the correct person using two identifiers.  Medicare Annual Wellness visit completed telephonically due to Covid-19 pandemic.   Location: Patient: home Provider: office   I discussed the limitations, risks, security and privacy concerns of performing an evaluation and management service by telephone and the availability of in person appointments. The patient expressed understanding and agreed to proceed.  Some vital signs may be absent or patient reported.   Thomas Marker, LPN    Review of Systems:   Cardiac Risk Factors include: advanced age (>35men, >30 women);male gender;hypertension;diabetes mellitus;dyslipidemia     Objective:    Vitals: BP (!) 105/54   Ht 5\' 8"  (1.727 m)   Wt 194 lb (88 kg)   BMI 29.50 kg/m   Body mass index is 29.5 kg/m.  Advanced Directives 11/26/2019 11/11/2018 08/07/2017  Does Patient Have a Medical Advance Directive? Yes Yes Yes  Type of Paramedic of [place];Living will Living will;Healthcare Power of [place];Living will  Copy of [place] in Chart? No - copy requested Yes - validated most recent copy scanned in chart (See row information) No - copy requested    Tobacco Social History   Tobacco Use  Smoking Status Former Smoker  . Quit date: 55  . Years since quitting: 45.2  Smokeless Tobacco Never Used     Counseling given: Not Answered   Clinical Intake:  Pre-visit preparation completed: Yes  Pain : No/denies pain     BMI - recorded: 29.5 Nutritional Status: BMI 25 -29 Overweight Nutritional Risks: None Diabetes: Yes CBG done?: No Did pt. bring in CBG monitor from home?: No   Nutrition Risk  Assessment:  Has the patient had any N/V/D within the last 2 months?  No  Does the patient have any non-healing wounds?  No  Has the patient had any unintentional weight loss or weight gain?  No   Diabetes:  Is the patient diabetic?  Yes  If diabetic, was a CBG obtained today?  No  Did the patient bring in their glucometer from home?  No  How often do you monitor your CBG's? Several times per week.   Financial Strains and Diabetes Management:  Are you having any financial strains with the device, your supplies or your medication? No .  Does the patient want to be seen by Chronic Care Management for management of their diabetes?  No  Would the patient like to be referred to a Nutritionist or for Diabetic Management?  No   Diabetic Exams:  Diabetic Eye Exam: Completed yearly per patient. Scheduled for next week with Dr. Ellin Mayhew; requested patient to have office send [place] a copy of diabetic eye exam.   Diabetic Foot Exam: Completed 09/18/18. Pt has been advised about the importance in completing this exam. Pt is scheduled for diabetic foot exam on 12/23/19.   How often do you need to have someone help you when you read instructions, pamphlets, or other written materials from your doctor or pharmacy?: 1 - Never  Interpreter Needed?: No  Information entered by :: Thomas Marker LPN  Past Medical History:  Diagnosis Date  . Arthritis   . Cancer ([place])   . Diabetes mellitus without complication (  HCC)   . GERD (gastroesophageal reflux disease)   . Haglund's deformity   . History of kidney stones   . Hyperlipidemia   . Hypertension   . Prostate CA ([place])   . Sleep apnea    Past Surgical History:  Procedure Laterality Date  . APPENDECTOMY    . COLONOSCOPY  2008   benign polyps  . COLONOSCOPY WITH PROPOFOL N/A 08/07/2017   Procedure: COLONOSCOPY WITH PROPOFOL;  Surgeon: Lollie Sails, MD;  Location: [facility] ENDOSCOPY;  Service: Endoscopy;  Laterality: N/A;  .  ESOPHAGOGASTRODUODENOSCOPY (EGD) WITH PROPOFOL N/A 08/07/2017   Procedure: ESOPHAGOGASTRODUODENOSCOPY (EGD) WITH PROPOFOL;  Surgeon: Lollie Sails, MD;  Location: [facility] ENDOSCOPY;  Service: Endoscopy;  Laterality: N/A;  . JOINT REPLACEMENT Right 2009  . JOINT REPLACEMENT Left 2015  . lithopexy    . PROSTATE BIOPSY  2012   malignant  . REPLACEMENT TOTAL KNEE Bilateral    Family History  Problem Relation Age of Onset  . Diabetes Mother   . Colon cancer Mother   . Colon cancer Father   . Prostate cancer Neg Hx   . Bladder Cancer Neg Hx   . Kidney cancer Neg Hx    Social History   Socioeconomic History  . Marital status: Married    Spouse name: Not on file  . Number of children: 3  . Years of education: Not on file  . 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 Smoking status: Former Smoker    Quit date: 1976    Years since quitting: 45.2  . Smokeless tobacco: Never Used  Substance and Sexual Activity  . Alcohol use: Yes    Alcohol/week: 14.0 standard drinks    Types: 14 Shots of liquor per week    Comment: 3 scotch drinks per night  . Drug use: No  . Sexual activity: Not on file  Other Topics Concern  . Not on file  Social History Narrative  . Not on file   Social Determinants of Health   Financial Resource Strain: Low Risk   . Difficulty of Paying Living Expenses: Not hard at all  Food Insecurity: No Food Insecurity  . Worried About Charity fundraiser in the Last Year: Never true  . Ran Out of Food in the Last Year: Never true  Transportation Needs: No Transportation Needs  . Lack of Transportation (Medical): No  . Lack of Transportation (Non-Medical): No  Physical Activity: Sufficiently Active  . Days of Exercise per Week: 5 days  . Minutes of Exercise per Session: 30 min  Stress: No Stress Concern Present  . Feeling of Stress : Not at all    Social Connections: Slightly Isolated  . Frequency of Communication with Friends and Family: More than three times a week  . Frequency of Social Gatherings with Friends and Family: Once a week  . Attends Religious Services: More than 4 times per year  . Active Member of Clubs or Organizations: No  . Attends Archivist Meetings: Never  . Marital Status: Married    Outpatient Encounter Medications as of 11/26/2019  Medication Sig  . aspirin 325 MG tablet Take by mouth. Pt does not take daily  . JARDIANCE 10 MG TABS tablet TAKE 1 TABLET BY MOUTH DAILY  . losartan (COZAAR) 25 MG tablet Take 1 tablet (25 mg total) by mouth daily.  . metFORMIN (  GLUCOPHAGE) 1000 MG tablet Take 1 tablet (1,000 mg total) by mouth 2 (two) times daily.  . Multiple Vitamins-Minerals (MULTIVITAMIN WITH MINERALS) tablet Take 1 tablet by mouth daily.  . pravastatin (PRAVACHOL) 20 MG tablet Take 1 tablet (20 mg total) by mouth daily.   No facility-administered encounter medications on file as of 11/26/2019.    Activities of Daily Living In your present state of health, do you have any difficulty performing the following activities: 11/26/2019  Hearing? N  Comment declines hearing aids  Vision? N  Difficulty concentrating or making decisions? N  Walking or climbing stairs? N  Dressing or bathing? N  Doing errands, shopping? N  Preparing Food and eating ? N  Using the Toilet? N  In the past six months, have you accidently leaked urine? N  Do you have problems with loss of bowel control? N  Managing your Medications? N  Managing your Finances? N  Housekeeping or managing your Housekeeping? N  Some recent data might be hidden    Patient Care Team: Glean Hess, MD as PCP - General (Internal Medicine) Lollie Sails, MD (Inactive) as Consulting Physician (Gastroenterology) Anell Barr, OD (Optometry) Rutherford Limerick, MD as Attending Physician (Orthopedic Surgery)   Assessment:    This is a routine wellness examination for Thomas Allen.  Exercise Activities and Dietary recommendations Current Exercise Habits: Home exercise routine, Type of exercise: walking, Time (Minutes): 30, Frequency (Times/Week): 5, Weekly Exercise (Minutes/Week): 150, Intensity: Moderate, Exercise limited by: None identified  Goals    . Patient Stated     Maintain health        Fall Risk Fall Risk  11/26/2019 11/11/2018 11/04/2018 09/18/2018 07/22/2018  Falls in the past year? 0 0 0 0 0  Comment - - - - [place]i Telephone Survey: data to providers prior to load  Number falls in past yr: 0 0 0 0 -  Injury with Fall? 0 0 0 0 -  Follow up Falls prevention discussed Falls prevention discussed Falls evaluation completed Falls evaluation completed -   FALL RISK PREVENTION PERTAINING TO THE HOME:  Any stairs in or around the home? No  If so, do they handrails? No   Home free of loose throw rugs in walkways, pet beds, electrical cords, etc? Yes  Adequate lighting in your home to reduce risk of falls? Yes   ASSISTIVE DEVICES UTILIZED TO PREVENT FALLS:  Life alert? No  Use of a cane, walker or w/c? No  Grab bars in the bathroom? No  Shower chair or bench in shower? No  Elevated toilet seat or a handicapped toilet? No   DME ORDERS:  DME order needed?  No   TIMED UP AND GO:  Was the test performed? No . Telephonic visit.   Education: Fall risk prevention has been discussed.  Intervention(s) required? No    Depression Screen PHQ 2/9 Scores 11/26/2019 07/01/2019 11/11/2018 11/04/2018  PHQ - 2 Score 0 0 0 0    Cognitive Function pt declined 6CIT for 2021 AWV      6CIT Screen 11/11/2018 08/23/2017 08/07/2016  What Year? 0 points 0 points 0 points  What month? 0 points 0 points 0 points  What time? 0 points 0 points 0 points  Count back from 20 0 points 0 points 0 points  Months in reverse 0 points 0 points 0 points  Repeat phrase 0 points 4 points 0 points  Total Score 0 4 0    Immunization  History  Administered Date(s) Administered  . PFIZER SARS-COV-2 Vaccination 11/24/2019    Qualifies for Shingles Vaccine? Yes . Due for Shingrix. Education has been provided regarding the importance of this vaccine. Pt has been advised to call insurance company to determine out of pocket expense. Advised may also receive vaccine at local pharmacy or Health Dept. Verbalized acceptance and understanding.  Tdap: Although this vaccine is not a covered service during a Wellness Exam, does the patient still wish to receive this vaccine today?  No .  Education has been provided regarding the importance of this vaccine. Advised may receive this vaccine at local pharmacy or Health Dept. Aware to provide a copy of the vaccination record if obtained from local pharmacy or Health Dept. Verbalized acceptance and understanding.  Flu Vaccine: Due for Flu vaccine. Does the patient want to receive this vaccine today?  No . Education has been provided regarding the importance of this vaccine but still declined. Advised may receive this vaccine at local pharmacy or Health Dept. Aware to provide a copy of the vaccination record if obtained from local pharmacy or Health Dept. Verbalized acceptance and understanding.  Pneumococcal Vaccine: Due for Pneumococcal vaccine. Does the patient want to receive this vaccine today?  No . Education has been provided regarding the importance of this vaccine but still declined. Advised may receive this vaccine at local pharmacy or Health Dept. Aware to provide a copy of the vaccination record if obtained from local pharmacy or Health Dept. Verbalized acceptance and understanding.   Screening Tests Health Maintenance  Topic Date Due  . OPHTHALMOLOGY EXAM  02/13/2018  . FOOT EXAM  09/19/2019  . INFLUENZA VACCINE  09/05/2023 (Originally 04/05/2019)  . TETANUS/TDAP  09/05/2023 (Originally 11/21/1960)  . PNA vac Low Risk Adult (1 of 2 - PCV13) 09/05/2023 (Originally 11/22/2006)  .  HEMOGLOBIN A1C  12/30/2019   Cancer Screenings:  Colorectal Screening: Completed 08/07/17. Repeat every 3 years; pt will need referral later this year.   Lung Cancer Screening: (Low Dose CT Chest recommended if Age 91-80 years, 30 pack-year currently smoking OR have quit w/in 15years.) does not qualify.   Additional Screening:  Hepatitis C Screening: no longer required  Vision Screening: Recommended annual ophthalmology exams for early detection of glaucoma and other disorders of the eye. Is the patient up to date with their annual eye exam?  Yes  Who is the provider or what is the name of the office in which the pt attends annual eye exams? Dr. Ellin Mayhew  Dental Screening: Recommended annual dental exams for proper oral hygiene  Community Resource Referral:  CRR required this visit?  No       Plan:    I have personally reviewed and addressed the Medicare Annual Wellness questionnaire and have noted the following in the patient's chart:  A. Medical and social history B. Use of alcohol, tobacco or illicit drugs  C. Current medications and supplements D. Functional ability and status E.  Nutritional status F.  Physical activity G. Advance directives H. List of other physicians I.  Hospitalizations, surgeries, and ER visits in previous 12 months J.  [place] such as hearing and vision if needed, cognitive and depression L. Referrals and appointments   In addition, I have reviewed and discussed with patient certain preventive protocols, quality metrics, and best practice recommendations. A written personalized care plan for preventive services as well as general preventive health recommendations were provided to patient.   Signed,  Thomas Marker, LPN Nurse Health Advisor  Nurse Notes: none

## 2019-11-26 NOTE — Patient Instructions (Signed)
Thomas Allen , Thank you for taking time to come for your Medicare Wellness Visit. I appreciate your ongoing commitment to your health goals. Please review the following plan we discussed and let me know if I can assist you in the future.   Screening recommendations/referrals: Colonoscopy: done 08/07/17 Recommended yearly ophthalmology/optometry visit for glaucoma screening and checkup Recommended yearly dental visit for hygiene and checkup  Vaccinations: Influenza vaccine: postponed Pneumococcal vaccine: postponed Tdap vaccine: due Shingles vaccine: Shingrix discussed. Please contact your pharmacy for coverage information.  Covid-19: 1st dose 11/24/19  Advanced directives: Please bring a copy of your health care power of attorney and living will to the office at your convenience.  Conditions/risks identified: Keep up the great work!  Next appointment: Please follow up in one year for your Medicare Annual Wellness visit.    Preventive Care 78 Years and Older, Male Preventive care refers to lifestyle choices and visits with your health care provider that can promote health and wellness. What does preventive care include?  A yearly physical exam. This is also called an annual well check.  Dental exams once or twice a year.  Routine eye exams. Ask your health care provider how often you should have your eyes checked.  Personal lifestyle choices, including:  Daily care of your teeth and gums.  Regular physical activity.  Eating a healthy diet.  Avoiding tobacco and drug use.  Limiting alcohol use.  Practicing safe sex.  Taking low doses of aspirin every day.  Taking vitamin and mineral supplements as recommended by your health care provider. What happens during an annual well check? The services and screenings done by your health care provider during your annual well check will depend on your age, overall health, lifestyle risk factors, and family history of  disease. Counseling  Your health care provider may ask you questions about your:  Alcohol use.  Tobacco use.  Drug use.  Emotional well-being.  Home and relationship well-being.  Sexual activity.  Eating habits.  History of falls.  Memory and ability to understand (cognition).  Work and work Statistician. Screening  You may have the following tests or measurements:  Height, weight, and BMI.  Blood pressure.  Lipid and cholesterol levels. These may be checked every 5 years, or more frequently if you are over 74 years old.  Skin check.  Lung cancer screening. You may have this screening every year starting at age 78 if you have a 30-pack-year history of smoking and currently smoke or have quit within the past 15 years.  Fecal occult blood test (FOBT) of the stool. You may have this test every year starting at age 78.  Flexible sigmoidoscopy or colonoscopy. You may have a sigmoidoscopy every 5 years or a colonoscopy every 10 years starting at age 78.  Prostate cancer screening. Recommendations will vary depending on your family history and other risks.  Hepatitis C blood test.  Hepatitis B blood test.  Sexually transmitted disease (STD) testing.  Diabetes screening. This is done by checking your blood sugar (glucose) after you have not eaten for a while (fasting). You may have this done every 1-3 years.  Abdominal aortic aneurysm (AAA) screening. You may need this if you are a current or former smoker.  Osteoporosis. You may be screened starting at age 3 if you are at high risk. Talk with your health care provider about your test results, treatment options, and if necessary, the need for more tests. Vaccines  Your health care provider may recommend  certain vaccines, such as:  Influenza vaccine. This is recommended every year.  Tetanus, diphtheria, and acellular pertussis (Tdap, Td) vaccine. You may need a Td booster every 10 years.  Zoster vaccine. You may  need this after age 71.  Pneumococcal 13-valent conjugate (PCV13) vaccine. One dose is recommended after age 28.  Pneumococcal polysaccharide (PPSV23) vaccine. One dose is recommended after age 52. Talk to your health care provider about which screenings and vaccines you need and how often you need them. This information is not intended to replace advice given to you by your health care provider. Make sure you discuss any questions you have with your health care provider. Document Released: 09/17/2015 Document Revised: 05/10/2016 Document Reviewed: 06/22/2015 Elsevier Interactive Patient Education  2017 [place] Prevention in the Home Falls can cause injuries. They can happen to people of all ages. There are many things you can do to make your home safe and to help prevent falls. What can I do on the outside of my home?  Regularly fix the edges of walkways and driveways and fix any cracks.  Remove anything that might make you trip as you walk through a door, such as a raised step or threshold.  Trim any bushes or trees on the path to your home.  Use bright outdoor lighting.  Clear any walking paths of anything that might make someone trip, such as rocks or tools.  Regularly check to see if handrails are loose or broken. Make sure that both sides of any steps have handrails.  Any raised decks and porches should have guardrails on the edges.  Have any leaves, snow, or ice cleared regularly.  Use sand or salt on walking paths during winter.  Clean up any spills in your garage right away. This includes oil or grease spills. What can I do in the bathroom?  Use night lights.  Install grab bars by the toilet and in the tub and shower. Do not use towel bars as grab bars.  Use non-skid mats or decals in the tub or shower.  If you need to sit down in the shower, use a plastic, non-slip stool.  Keep the floor dry. Clean up any water that spills on the floor as soon as it  happens.  Remove soap buildup in the tub or shower regularly.  Attach bath mats securely with double-sided non-slip rug tape.  Do not have throw rugs and other things on the floor that can make you trip. What can I do in the bedroom?  Use night lights.  Make sure that you have a light by your bed that is easy to reach.  Do not use any sheets or blankets that are too big for your bed. They should not hang down onto the floor.  Have a firm chair that has side arms. You can use this for support while you get dressed.  Do not have throw rugs and other things on the floor that can make you trip. What can I do in the kitchen?  Clean up any spills right away.  Avoid walking on wet floors.  Keep items that you use a lot in easy-to-reach places.  If you need to reach something above you, use a strong step stool that has a grab bar.  Keep electrical cords out of the way.  Do not use floor polish or wax that makes floors slippery. If you must use wax, use non-skid floor wax.  Do not have throw rugs and other  things on the floor that can make you trip. What can I do with my stairs?  Do not leave any items on the stairs.  Make sure that there are handrails on both sides of the stairs and use them. Fix handrails that are broken or loose. Make sure that handrails are as long as the stairways.  Check any carpeting to make sure that it is firmly attached to the stairs. Fix any carpet that is loose or worn.  Avoid having throw rugs at the top or bottom of the stairs. If you do have throw rugs, attach them to the floor with carpet tape.  Make sure that you have a light switch at the top of the stairs and the bottom of the stairs. If you do not have them, ask someone to add them for you. What else can I do to help prevent falls?  Wear shoes that:  Do not have high heels.  Have rubber bottoms.  Are comfortable and fit you well.  Are closed at the toe. Do not wear sandals.  If you  use a stepladder:  Make sure that it is fully opened. Do not climb a closed stepladder.  Make sure that both sides of the stepladder are locked into place.  Ask someone to hold it for you, if possible.  Clearly mark and make sure that you can see:  Any grab bars or handrails.  First and last steps.  Where the edge of each step is.  Use tools that help you move around (mobility aids) if they are needed. These include:  Canes.  Walkers.  Scooters.  Crutches.  Turn on the lights when you go into a dark area. Replace any light bulbs as soon as they burn out.  Set up your furniture so you have a clear path. Avoid moving your furniture around.  If any of your floors are uneven, fix them.  If there are any pets around you, be aware of where they are.  Review your medicines with your doctor. Some medicines can make you feel dizzy. This can increase your chance of falling. Ask your doctor what other things that you can do to help prevent falls. This information is not intended to replace advice given to you by your health care provider. Make sure you discuss any questions you have with your health care provider. Document Released: 06/17/2009 Document Revised: 01/27/2016 Document Reviewed: 09/25/2014 Elsevier Interactive Patient Education  2017 [employer].

## 2019-12-01 ENCOUNTER — Other Ambulatory Visit: Payer: Self-pay | Admitting: Internal Medicine

## 2019-12-01 DIAGNOSIS — E119 Type 2 diabetes mellitus without complications: Secondary | ICD-10-CM

## 2019-12-02 ENCOUNTER — Other Ambulatory Visit: Payer: Self-pay | Admitting: Internal Medicine

## 2019-12-02 DIAGNOSIS — K219 Gastro-esophageal reflux disease without esophagitis: Secondary | ICD-10-CM

## 2019-12-17 ENCOUNTER — Ambulatory Visit: Payer: Medicare Other | Attending: Internal Medicine

## 2019-12-17 DIAGNOSIS — Z23 Encounter for immunization: Secondary | ICD-10-CM

## 2019-12-17 NOTE — Progress Notes (Signed)
   Covid-19 Vaccination Clinic  Name:  Thomas Allen    MRN: [id number] DOB: 1940/11/10  12/17/2019  [name] was observed post Covid-19 immunization for 15 minutes without incident. He was provided with Vaccine Information Sheet and instruction to access the V-Safe system.   [name] was instructed to call 911 with any severe reactions post vaccine: Marland Kitchen Difficulty breathing  . Swelling of face and throat  . A fast heartbeat  . A bad rash all over body  . Dizziness and weakness   Immunizations Administered    Name Date Dose VIS Date Route   Pfizer COVID-19 Vaccine 12/17/2019  1:32 PM 0.3 mL 08/15/2019 Intramuscular   Manufacturer: [place]   Lot: [id number]   [place]: [id number]

## 2019-12-23 ENCOUNTER — Other Ambulatory Visit: Payer: Self-pay

## 2019-12-23 ENCOUNTER — Encounter: Payer: Self-pay | Admitting: Internal Medicine

## 2019-12-23 ENCOUNTER — Ambulatory Visit (INDEPENDENT_AMBULATORY_CARE_PROVIDER_SITE_OTHER): Payer: Medicare Other | Admitting: Internal Medicine

## 2019-12-23 VITALS — BP 118/70 | HR 81 | Temp 96.9°F | Ht 68.0 in | Wt 202.0 lb

## 2019-12-23 DIAGNOSIS — E1169 Type 2 diabetes mellitus with other specified complication: Secondary | ICD-10-CM

## 2019-12-23 DIAGNOSIS — R030 Elevated blood-pressure reading, without diagnosis of hypertension: Secondary | ICD-10-CM

## 2019-12-23 DIAGNOSIS — E785 Hyperlipidemia, unspecified: Secondary | ICD-10-CM | POA: Diagnosis not present

## 2019-12-23 DIAGNOSIS — E118 Type 2 diabetes mellitus with unspecified complications: Secondary | ICD-10-CM

## 2019-12-23 DIAGNOSIS — K219 Gastro-esophageal reflux disease without esophagitis: Secondary | ICD-10-CM

## 2019-12-23 MED ORDER — LOSARTAN POTASSIUM 25 MG PO TABS: 25.0000 mg | ORAL_TABLET | Freq: Every day | ORAL | 3 refills | Status: DC

## 2019-12-23 MED ORDER — FARXIGA 10 MG PO TABS: 10.0000 mg | ORAL_TABLET | Freq: Every day | ORAL | 1 refills | Status: DC

## 2019-12-23 NOTE — Progress Notes (Signed)
Date:  12/23/2019   Name:  Thomas Allen   DOB:  24-Jan-1941   MRN:  [id number]   Chief Complaint: Diabetes (follow up / foot exam/ scheduled for eye exam next week )  Diabetes He presents for his follow-up diabetic visit. He has type 2 diabetes mellitus. His disease course has been stable. Pertinent negatives for hypoglycemia include no headaches or tremors. Pertinent negatives for diabetes include no chest pain, no fatigue, no polydipsia and no polyuria. Current diabetic treatments: metformin and jardiance. He is compliant with treatment all of the time. His weight is stable. He is following a generally healthy diet. He monitors blood glucose at home 1-2 x per week. His breakfast blood glucose is taken between 6-7 am. His breakfast blood glucose range is generally 90-110 mg/dl.  Hyperlipidemia This is a chronic problem. The problem is controlled. Pertinent negatives include no chest pain or shortness of breath. Current antihyperlipidemic treatment includes statins. The current treatment provides significant improvement of lipids.  Gastroesophageal Reflux He complains of heartburn. He reports no abdominal pain, no chest pain, no coughing or no wheezing. The problem occurs rarely. Pertinent negatives include no fatigue. He has tried a PPI for the symptoms. The treatment provided significant relief.   Immunization History  Administered Date(s) Administered  . PFIZER SARS-COV-2 Vaccination 11/24/2019, 12/17/2019     Lab Results  Component Value Date   CREATININE 0.90 07/01/2019   BUN 19 07/01/2019   NA 139 07/01/2019   K 4.9 07/01/2019   CL 104 07/01/2019   CO2 23 07/01/2019   Lab Results  Component Value Date   CHOL 164 02/25/2019   HDL 50 02/25/2019   LDLCALC 82 02/25/2019   TRIG 158 (H) 02/25/2019   CHOLHDL 3.3 02/25/2019   Lab Results  Component Value Date   TSH 2.040 02/05/2017   Lab Results  Component Value Date   HGBA1C 6.0 (H) 07/01/2019   Lab Results  Component  Value Date   WBC 7.3 09/18/2018   HGB 15.4 09/18/2018   HCT 47.6 09/18/2018   MCV 101 (H) 09/18/2018   PLT 255 09/18/2018   Lab Results  Component Value Date   ALT 25 07/01/2019   AST 17 07/01/2019   ALKPHOS 61 07/01/2019   BILITOT 0.4 07/01/2019     Review of Systems  Constitutional: Negative for appetite change, fatigue and unexpected weight change.  Eyes: Negative for visual disturbance.  Respiratory: Negative for cough, shortness of breath and wheezing.   Cardiovascular: Negative for chest pain, palpitations and leg swelling.  Gastrointestinal: Positive for heartburn. Negative for abdominal pain and blood in stool.  Endocrine: Negative for polydipsia and polyuria.  Genitourinary: Negative for dysuria and hematuria.  Skin: Negative for color change and rash.  Neurological: Negative for tremors, numbness and headaches.  Psychiatric/Behavioral: Negative for dysphoric mood.    Patient Active Problem List   Diagnosis Date Noted  . Hand pain, right 08/23/2017  . Erectile dysfunction following radiation therapy 06/26/2017  . Hyperlipidemia associated with type 2 diabetes mellitus ([place]) 03/12/2017  . Asymptomatic PVCs 02/05/2017  . Intractable hiccups 02/05/2017  . Gastroesophageal reflux disease 02/05/2017  . Alcohol use disorder 01/19/2016  . Type II diabetes mellitus with complication ([place]) 123456  . Carpal tunnel syndrome 03/30/2015  . Elevated blood pressure, situational 03/30/2015  . Abnormal LFTs 03/30/2015  . H/O adenomatous polyp of colon 03/30/2015  . Tobacco use disorder, moderate, in sustained remission 03/30/2015  . History of prostate cancer 09/02/2013  .  Posterior calcaneal exostosis 02/26/2013  . Calcific Achilles tendinitis 02/26/2013    Allergies  Allergen Reactions  . Ace Inhibitors Cough    Past Surgical History:  Procedure Laterality Date  . APPENDECTOMY    . COLONOSCOPY  2008   benign polyps  . COLONOSCOPY WITH PROPOFOL N/A 08/07/2017    Procedure: COLONOSCOPY WITH PROPOFOL;  Surgeon: Lollie Sails, MD;  Location: [facility] ENDOSCOPY;  Service: Endoscopy;  Laterality: N/A;  . ESOPHAGOGASTRODUODENOSCOPY (EGD) WITH PROPOFOL N/A 08/07/2017   Procedure: ESOPHAGOGASTRODUODENOSCOPY (EGD) WITH PROPOFOL;  Surgeon: Lollie Sails, MD;  Location: [facility] ENDOSCOPY;  Service: Endoscopy;  Laterality: N/A;  . JOINT REPLACEMENT Right 2009  . JOINT REPLACEMENT Left 2015  . lithopexy    . PROSTATE BIOPSY  2012   malignant  . REPLACEMENT TOTAL KNEE Bilateral     Social History   Tobacco Use  . Smoking status: Former Smoker    Quit date: 1976    Years since quitting: 45.3  . Smokeless tobacco: Never Used  Substance Use Topics  . Alcohol use: Yes    Alcohol/week: 14.0 standard drinks    Types: 14 Shots of liquor per week    Comment: 3 scotch drinks per night  . Drug use: No     Medication list has been reviewed and updated.  Current Meds  Medication Sig  . aspirin 325 MG tablet Take by mouth. Pt does not take daily  . losartan (COZAAR) 25 MG tablet Take 1 tablet (25 mg total) by mouth daily.  . metFORMIN (GLUCOPHAGE) 1000 MG tablet TAKE 1 TABLET BY MOUTH TWICE A DAY  . Multiple Vitamins-Minerals (MULTIVITAMIN WITH MINERALS) tablet Take 1 tablet by mouth daily.  . pravastatin (PRAVACHOL) 20 MG tablet Take 1 tablet (20 mg total) by mouth daily.    PHQ 2/9 Scores 12/23/2019 11/26/2019 07/01/2019 11/11/2018  PHQ - 2 Score 0 0 0 0  PHQ- 9 Score 0 - - -    BP Readings from Last 3 Encounters:  12/23/19 118/70  11/26/19 (!) 105/54  07/01/19 112/60    Physical Exam Vitals and nursing note reviewed.  Constitutional:      General: He is not in acute distress.    Appearance: He is well-developed.  HENT:     Head: Normocephalic and atraumatic.  Pulmonary:     Effort: Pulmonary effort is normal. No respiratory distress.  Musculoskeletal:        General: Normal range of motion.  Skin:    General: Skin is warm and dry.      Findings: No rash.  Neurological:     Mental Status: He is alert and oriented to person, place, and time.  Psychiatric:        Behavior: Behavior normal.        Thought Content: Thought content normal.     Wt Readings from Last 3 Encounters:  12/23/19 202 lb (91.6 kg)  11/26/19 194 lb (88 kg)  07/01/19 203 lb (92.1 kg)    BP 118/70   Pulse 81   Temp (!) 96.9 F (36.1 C) (Temporal)   Ht 5\' 8"  (1.727 m)   Wt 202 lb (91.6 kg)   SpO2 97%   BMI 30.71 kg/m   Assessment and Plan: 1. Type II diabetes mellitus with complication (HCC) Clinically stable by exam and report without s/s of hypoglycemia. DM complicated by hyperlipidemia. Tolerating medications  well without side effects but Vania Rea is too expenisive- Will try [place] instead of Jardiance  Comprehensive metabolic panel -  Hemoglobin A1c - dapagliflozin propanediol (FARXIGA) 10 MG TABS tablet; Take 10 mg by mouth daily before breakfast.  Dispense: 90 tablet; Refill: 1 - losartan (COZAAR) 25 MG tablet; Take 1 tablet (25 mg total) by mouth daily.  Dispense: 90 tablet; Refill: 3  2. Hyperlipidemia associated with type 2 diabetes mellitus ([place]) Tolerating statin medication without side effects at this time LDL is at goal of < 70 on current dose Continue same therapy without change at this time. - Lipid panel  3. Gastroesophageal reflux disease, unspecified whether esophagitis present Symptoms well controlled on daily PPI No red flag signs such as weight loss, n/v, melena Will continue omeprazole. - CBC with Differential/Platelet  4. Elevated blood pressure, situational Controlled now that he is taking ARB daily for renal protection   Partially dictated using Editor, commissioning. Any errors are unintentional.  Halina Maidens, MD [facility]  12/23/2019

## 2019-12-23 NOTE — Patient Instructions (Signed)
DM eye exam is needed.

## 2019-12-24 LAB — HEMOGLOBIN A1C
Est. average glucose Bld gHb Est-mCnc: 120 mg/dL
Hgb A1c MFr Bld: 5.8 % — ABNORMAL HIGH (ref 4.8–5.6)

## 2019-12-24 LAB — CBC WITH DIFFERENTIAL/PLATELET
Basophils Absolute: 0 10*3/uL (ref 0.0–0.2)
Basos: 1 %
EOS (ABSOLUTE): 0.5 10*3/uL — ABNORMAL HIGH (ref 0.0–0.4)
Eos: 6 %
Hematocrit: 45.8 % (ref 37.5–51.0)
Hemoglobin: 15.7 g/dL (ref 13.0–17.7)
Immature Grans (Abs): 0 10*3/uL (ref 0.0–0.1)
Immature Granulocytes: 0 %
Lymphocytes Absolute: 1.7 10*3/uL (ref 0.7–3.1)
Lymphs: 21 %
MCH: 33.5 pg — ABNORMAL HIGH (ref 26.6–33.0)
MCHC: 34.3 g/dL (ref 31.5–35.7)
MCV: 98 fL — ABNORMAL HIGH (ref 79–97)
Monocytes Absolute: 0.7 10*3/uL (ref 0.1–0.9)
Monocytes: 8 %
Neutrophils Absolute: 5.4 10*3/uL (ref 1.4–7.0)
Neutrophils: 64 %
Platelets: 229 10*3/uL (ref 150–450)
RBC: 4.69 x10E6/uL (ref 4.14–5.80)
RDW: 12.2 % (ref 11.6–15.4)
WBC: 8.4 10*3/uL (ref 3.4–10.8)

## 2019-12-24 LAB — COMPREHENSIVE METABOLIC PANEL
ALT: 22 IU/L (ref 0–44)
AST: 20 IU/L (ref 0–40)
Albumin/Globulin Ratio: 2.1 (ref 1.2–2.2)
Albumin: 4.4 g/dL (ref 3.7–4.7)
Alkaline Phosphatase: 73 IU/L (ref 39–117)
BUN/Creatinine Ratio: 18 (ref 10–24)
BUN: 17 mg/dL (ref 8–27)
Bilirubin Total: 0.4 mg/dL (ref 0.0–1.2)
CO2: 21 mmol/L (ref 20–29)
Calcium: 9.6 mg/dL (ref 8.6–10.2)
Chloride: 104 mmol/L (ref 96–106)
Creatinine, Ser: 0.93 mg/dL (ref 0.76–1.27)
GFR calc Af Amer: 91 mL/min/{1.73_m2} (ref >59)
GFR calc non Af Amer: 78 mL/min/{1.73_m2} (ref >59)
Globulin, Total: 2.1 g/dL (ref 1.5–4.5)
Glucose: 125 mg/dL — ABNORMAL HIGH (ref 65–99)
Potassium: 5.2 mmol/L (ref 3.5–5.2)
Sodium: 141 mmol/L (ref 134–144)
Total Protein: 6.5 g/dL (ref 6.0–8.5)

## 2019-12-24 LAB — LIPID PANEL
Chol/HDL Ratio: 3.8 ratio (ref 0.0–5.0)
Cholesterol, Total: 166 mg/dL (ref 100–199)
HDL: 44 mg/dL (ref >39)
LDL Chol Calc (NIH): 101 mg/dL — ABNORMAL HIGH (ref 0–99)
Triglycerides: 115 mg/dL (ref 0–149)
VLDL Cholesterol Cal: 21 mg/dL (ref 5–40)

## 2019-12-26 DIAGNOSIS — E119 Type 2 diabetes mellitus without complications: Secondary | ICD-10-CM | POA: Diagnosis not present

## 2019-12-26 DIAGNOSIS — H2513 Age-related nuclear cataract, bilateral: Secondary | ICD-10-CM | POA: Diagnosis not present

## 2019-12-26 DIAGNOSIS — H40003 Preglaucoma, unspecified, bilateral: Secondary | ICD-10-CM | POA: Diagnosis not present

## 2019-12-26 LAB — HM DIABETES EYE EXAM

## 2020-01-12 DIAGNOSIS — M79642 Pain in left hand: Secondary | ICD-10-CM | POA: Diagnosis not present

## 2020-01-12 DIAGNOSIS — M79641 Pain in right hand: Secondary | ICD-10-CM | POA: Diagnosis not present

## 2020-01-12 DIAGNOSIS — G5603 Carpal tunnel syndrome, bilateral upper limbs: Secondary | ICD-10-CM | POA: Diagnosis not present

## 2020-01-12 DIAGNOSIS — M65342 Trigger finger, left ring finger: Secondary | ICD-10-CM | POA: Diagnosis not present

## 2020-02-11 DIAGNOSIS — G5601 Carpal tunnel syndrome, right upper limb: Secondary | ICD-10-CM | POA: Diagnosis not present

## 2020-02-11 DIAGNOSIS — Z4789 Encounter for other orthopedic aftercare: Secondary | ICD-10-CM | POA: Diagnosis not present

## 2020-02-11 DIAGNOSIS — G5603 Carpal tunnel syndrome, bilateral upper limbs: Secondary | ICD-10-CM | POA: Diagnosis not present

## 2020-03-01 ENCOUNTER — Other Ambulatory Visit: Payer: Self-pay | Admitting: Internal Medicine

## 2020-03-01 DIAGNOSIS — E119 Type 2 diabetes mellitus without complications: Secondary | ICD-10-CM

## 2020-03-01 MED ORDER — METFORMIN HCL 1000 MG PO TABS: 1000.0000 mg | ORAL_TABLET | Freq: Two times a day (BID) | ORAL | 1 refills | Status: DC

## 2020-03-01 NOTE — Telephone Encounter (Signed)
Medication Refill - Medication: metFORMIN (GLUCOPHAGE) 1000 MG tablet  Patient stated that he is all out of the medication and would like it filled asap.   Preferred Pharmacy (with phone number or street name):  [id number] [place] [address] Phone:  [phone]  Fax:  [phone]       Agent: Please be advised that RX refills may take up to 3 business days. We ask that you follow-up with your pharmacy.

## 2020-04-12 DIAGNOSIS — C61 Malignant neoplasm of prostate: Secondary | ICD-10-CM | POA: Diagnosis not present

## 2020-04-12 DIAGNOSIS — N521 Erectile dysfunction due to diseases classified elsewhere: Secondary | ICD-10-CM | POA: Diagnosis not present

## 2020-04-16 ENCOUNTER — Telehealth: Payer: Self-pay | Admitting: Internal Medicine

## 2020-04-16 ENCOUNTER — Other Ambulatory Visit: Payer: Self-pay

## 2020-04-16 DIAGNOSIS — K219 Gastro-esophageal reflux disease without esophagitis: Secondary | ICD-10-CM

## 2020-04-16 MED ORDER — OMEPRAZOLE 20 MG PO CPDR: 20.0000 mg | DELAYED_RELEASE_CAPSULE | Freq: Two times a day (BID) | ORAL | 0 refills | Status: DC

## 2020-04-16 NOTE — Telephone Encounter (Signed)
Called CB# could not leave VM. Phone continued to ring.  KP

## 2020-04-16 NOTE — Telephone Encounter (Signed)
Patient is reuturning CMA call. Call back [phone]

## 2020-04-16 NOTE — Telephone Encounter (Signed)
Called pt told him that [facility]. Pt verbalized understanding.  KP

## 2020-04-16 NOTE — Telephone Encounter (Signed)
Medication Refill - Medication: omeprazole  Has the patient contacted their pharmacy? No. (Agent: If no, request that the patient contact the pharmacy for the refill.) (Agent: If yes, when and what did the pharmacy advise?)  Preferred Pharmacy (with phone number or street name): [id number] [place] [address], [place]: Please be advised that RX refills may take up to 3 business days. We ask that you follow-up with your pharmacy.

## 2020-04-16 NOTE — Telephone Encounter (Signed)
Called pt left VM that [facility].  KP

## 2020-04-16 NOTE — Telephone Encounter (Signed)
Copied from [place] [id number]. Topic: General - Other >> Apr 16, 2020  9:02 AM Keene Breath wrote: Reason for CRM: Patient called to ask the nurse to call him regarding his acid reflux.  He did not want a script called in yet until he spoke with a nurse.  CB# [phone]

## 2020-04-27 ENCOUNTER — Other Ambulatory Visit
Admission: RE | Admit: 2020-04-27 | Discharge: 2020-04-27 | Disposition: A | Discharge status: Home / Self Care | Payer: Medicare Other | Attending: Internal Medicine | Admitting: Internal Medicine

## 2020-04-27 ENCOUNTER — Encounter: Payer: Self-pay | Admitting: Internal Medicine

## 2020-04-27 ENCOUNTER — Other Ambulatory Visit: Payer: Self-pay

## 2020-04-27 ENCOUNTER — Ambulatory Visit (INDEPENDENT_AMBULATORY_CARE_PROVIDER_SITE_OTHER): Payer: Medicare Other | Admitting: Internal Medicine

## 2020-04-27 VITALS — BP 118/76 | HR 89 | Temp 98.1°F | Ht 68.0 in | Wt 196.0 lb

## 2020-04-27 DIAGNOSIS — Z7984 Long term (current) use of oral hypoglycemic drugs: Secondary | ICD-10-CM | POA: Insufficient documentation

## 2020-04-27 DIAGNOSIS — E118 Type 2 diabetes mellitus with unspecified complications: Secondary | ICD-10-CM

## 2020-04-27 DIAGNOSIS — K219 Gastro-esophageal reflux disease without esophagitis: Secondary | ICD-10-CM | POA: Diagnosis not present

## 2020-04-27 DIAGNOSIS — E785 Hyperlipidemia, unspecified: Secondary | ICD-10-CM | POA: Diagnosis not present

## 2020-04-27 DIAGNOSIS — Z8546 Personal history of malignant neoplasm of prostate: Secondary | ICD-10-CM

## 2020-04-27 DIAGNOSIS — E1169 Type 2 diabetes mellitus with other specified complication: Secondary | ICD-10-CM

## 2020-04-27 MED ORDER — PRAVASTATIN SODIUM 20 MG PO TABS: 20.0000 mg | ORAL_TABLET | Freq: Every day | ORAL | 1 refills | Status: DC

## 2020-04-27 NOTE — Progress Notes (Signed)
Date:  04/27/2020   Name:  Thomas Allen   DOB:  1941/04/26   MRN:  [id number]   Chief Complaint: Diabetes (follow up last reading 122 last week)  Diabetes He presents for his follow-up diabetic visit. He has type 2 diabetes mellitus. His disease course has been stable. Pertinent negatives for hypoglycemia include no headaches, nervousness/anxiousness or tremors. Pertinent negatives for diabetes include no chest pain, no fatigue, no polydipsia and no polyuria. Symptoms are stable. Current diabetic treatment includes oral agent (monotherapy) (metformin alone (jardiance/farxiga stopped)). He is compliant with treatment all of the time. His weight is decreasing steadily. There is no change in his home blood glucose trend. His breakfast blood glucose is taken between 6-7 am. His breakfast blood glucose range is generally 110-130 mg/dl. An ACE inhibitor/angiotensin II receptor blocker is being taken. Eye exam is current.  Gastroesophageal Reflux He complains of heartburn. He reports no abdominal pain, no chest pain, no coughing or no wheezing. This is a chronic problem. The problem occurs rarely. The heartburn is located in the substernum. The heartburn is of mild intensity. Pertinent negatives include no fatigue. He has tried a PPI for the symptoms. The treatment provided significant relief.  Prostate cancer - treated with Lupron and XRT, being followed by Rad Onc.  Recent PSA was slightly higher.  He will go back for a repeat in 3 months.  Immunization History  Administered Date(s) Administered  . PFIZER SARS-COV-2 Vaccination 11/24/2019, 12/17/2019    Lab Results  Component Value Date   CREATININE 0.93 12/23/2019   BUN 17 12/23/2019   NA 141 12/23/2019   K 5.2 12/23/2019   CL 104 12/23/2019   CO2 21 12/23/2019   Lab Results  Component Value Date   CHOL 166 12/23/2019   HDL 44 12/23/2019   LDLCALC 101 (H) 12/23/2019   TRIG 115 12/23/2019   CHOLHDL 3.8 12/23/2019   Lab Results    Component Value Date   TSH 2.040 02/05/2017   Lab Results  Component Value Date   HGBA1C 5.8 (H) 12/23/2019   Lab Results  Component Value Date   WBC 8.4 12/23/2019   HGB 15.7 12/23/2019   HCT 45.8 12/23/2019   MCV 98 (H) 12/23/2019   PLT 229 12/23/2019   Lab Results  Component Value Date   ALT 22 12/23/2019   AST 20 12/23/2019   ALKPHOS 73 12/23/2019   BILITOT 0.4 12/23/2019     Review of Systems  Constitutional: Negative for appetite change, fatigue and unexpected weight change.  HENT: Negative for trouble swallowing.   Eyes: Negative for visual disturbance.  Respiratory: Negative for cough, shortness of breath and wheezing.   Cardiovascular: Negative for chest pain, palpitations and leg swelling.  Gastrointestinal: Positive for heartburn. Negative for abdominal pain.  Endocrine: Negative for polydipsia and polyuria.  Genitourinary: Negative for dysuria, frequency, hematuria and urgency.  Skin: Negative for color change and rash.  Neurological: Negative for tremors, numbness and headaches.  Psychiatric/Behavioral: Negative for dysphoric mood and sleep disturbance. The patient is not nervous/anxious.     Patient Active Problem List   Diagnosis Date Noted  . Hand pain, right 08/23/2017  . Erectile dysfunction following radiation therapy 06/26/2017  . Hyperlipidemia associated with type 2 diabetes mellitus ([place]) 03/12/2017  . Asymptomatic PVCs 02/05/2017  . Intractable hiccups 02/05/2017  . Gastroesophageal reflux disease 02/05/2017  . Alcohol use disorder 01/19/2016  . Type II diabetes mellitus with complication ([place]) 10/93/2355  . Carpal tunnel syndrome  03/30/2015  . Elevated blood pressure, situational 03/30/2015  . Abnormal LFTs 03/30/2015  . H/O adenomatous polyp of colon 03/30/2015  . Tobacco use disorder, moderate, in sustained remission 03/30/2015  . History of prostate cancer 09/02/2013  . Posterior calcaneal exostosis 02/26/2013  . Calcific Achilles  tendinitis 02/26/2013    Allergies  Allergen Reactions  . Ace Inhibitors Cough    Past Surgical History:  Procedure Laterality Date  . APPENDECTOMY    . COLONOSCOPY  2008   benign polyps  . COLONOSCOPY WITH PROPOFOL N/A 08/07/2017   Procedure: COLONOSCOPY WITH PROPOFOL;  Surgeon: Lollie Sails, MD;  Location[facility] ENDOSCOPY;  Service: Endoscopy;  Laterality: N/A;  . ESOPHAGOGASTRODUODENOSCOPY (EGD) WITH PROPOFOL N/A 08/07/2017   Procedure: ESOPHAGOGASTRODUODENOSCOPY (EGD) WITH PROPOFOL;  Surgeon: Lollie Sails, MD;  Location: [facility] ENDOSCOPY;  Service: Endoscopy;  Laterality: N/A;  . JOINT REPLACEMENT Right 2009  . JOINT REPLACEMENT Left 2015  . lithopexy    . PROSTATE BIOPSY  2012   malignant  . REPLACEMENT TOTAL KNEE Bilateral     Social History   Tobacco Use  . Smoking status: Former Smoker    Quit date: 1976    Years since quitting: 45.6  . Smokeless tobacco: Never Used  Vaping Use  . Vaping Use: Never used  Substance Use Topics  . Alcohol use: Yes    Alcohol/week: 14.0 standard drinks    Types: 14 Shots of liquor per week    Comment: 3 scotch drinks per night  . Drug use: No     Medication list has been reviewed and updated.  Current Meds  Medication Sig  . aspirin 325 MG tablet Take by mouth. Pt does not take daily  . losartan (COZAAR) 25 MG tablet Take 1 tablet (25 mg total) by mouth daily.  . metFORMIN (GLUCOPHAGE) 1000 MG tablet Take 1 tablet (1,000 mg total) by mouth 2 (two) times daily.  . Multiple Vitamins-Minerals (MULTIVITAMIN WITH MINERALS) tablet Take 1 tablet by mouth daily.  Marland Kitchen omeprazole (PRILOSEC) 20 MG capsule Take 1 capsule (20 mg total) by mouth 2 (two) times daily before a meal. (Patient taking differently: Take 20 mg by mouth daily. )  . pravastatin (PRAVACHOL) 20 MG tablet Take 1 tablet (20 mg total) by mouth daily.    PHQ 2/9 Scores 12/23/2019 11/26/2019 07/01/2019 11/11/2018  PHQ - 2 Score 0 0 0 0  PHQ- 9 Score 0 - - -    GAD 7  : Generalized Anxiety Score 12/23/2019  Nervous, Anxious, on Edge 0  Control/stop worrying 0  Worry too much - different things 0  Trouble relaxing 0  Restless 0  Easily annoyed or irritable 0  Afraid - awful might happen 0  Total GAD 7 Score 0  Anxiety Difficulty Not difficult at all    BP Readings from Last 3 Encounters:  04/27/20 118/76  12/23/19 118/70  11/26/19 (!) 105/54    Physical Exam Vitals and nursing note reviewed.  Constitutional:      General: He is not in acute distress.    Appearance: He is well-developed.  HENT:     Head: Normocephalic and atraumatic.  Cardiovascular:     Rate and Rhythm: Normal rate and regular rhythm.     Pulses: Normal pulses.     Heart sounds: No murmur heard.   Pulmonary:     Effort: Pulmonary effort is normal. No respiratory distress.     Breath sounds: No wheezing or rhonchi.  Musculoskeletal:  Cervical back: Normal range of motion.     Right lower leg: No edema.     Left lower leg: No edema.  Lymphadenopathy:     Cervical: No cervical adenopathy.  Skin:    General: Skin is warm and dry.     Capillary Refill: Capillary refill takes less than 2 seconds.     Findings: No rash.  Neurological:     General: No focal deficit present.     Mental Status: He is alert and oriented to person, place, and time.  Psychiatric:        Mood and Affect: Mood normal.     Wt Readings from Last 3 Encounters:  04/27/20 196 lb (88.9 kg)  12/23/19 202 lb (91.6 kg)  11/26/19 194 lb (88 kg)    BP 118/76   Pulse 89   Temp 98.1 F (36.7 C) (Oral)   Ht 5\' 8"  (1.727 m)   Wt 196 lb (88.9 kg)   SpO2 97%   BMI 29.80 kg/m   Assessment and Plan: 1. Type II diabetes mellitus with complication (HCC) Clinically stable by exam and report without s/s of hypoglycemia. DM complicated by lipids. Tolerating medications - metformin -  well without side effects or other concerns. - Hemoglobin A1c  2. History of prostate cancer Followed by Rad  Onc  3. Gastroesophageal reflux disease, unspecified whether esophagitis present Symptoms well controlled on daily PPI No red flag signs such as weight loss, n/v, melena Will continue omeprazole  4. Hyperlipidemia associated with type 2 diabetes mellitus ([place]) Tolerating statin medication without side effects at this time LDL is at goal of < 70 on current dose Continue same therapy without change at this time. - pravastatin (PRAVACHOL) 20 MG tablet; Take 1 tablet (20 mg total) by mouth daily.  Dispense: 90 tablet; Refill: 1   Partially dictated using [employer]. Any errors are unintentional.  Halina Maidens, MD [facility]  04/27/2020

## 2020-04-28 LAB — HEMOGLOBIN A1C
Hgb A1c MFr Bld: 5.9 % — ABNORMAL HIGH (ref 4.8–5.6)
Mean Plasma Glucose: 123 mg/dL

## 2020-07-08 ENCOUNTER — Other Ambulatory Visit: Payer: Self-pay | Admitting: Internal Medicine

## 2020-07-08 DIAGNOSIS — K219 Gastro-esophageal reflux disease without esophagitis: Secondary | ICD-10-CM

## 2020-07-12 DIAGNOSIS — C61 Malignant neoplasm of prostate: Secondary | ICD-10-CM | POA: Diagnosis not present

## 2020-07-28 DIAGNOSIS — C61 Malignant neoplasm of prostate: Secondary | ICD-10-CM | POA: Diagnosis not present

## 2020-07-28 DIAGNOSIS — Z8546 Personal history of malignant neoplasm of prostate: Secondary | ICD-10-CM | POA: Diagnosis not present

## 2020-08-04 DIAGNOSIS — C61 Malignant neoplasm of prostate: Secondary | ICD-10-CM | POA: Diagnosis not present

## 2020-08-04 DIAGNOSIS — C772 Secondary and unspecified malignant neoplasm of intra-abdominal lymph nodes: Secondary | ICD-10-CM | POA: Diagnosis not present

## 2020-08-16 ENCOUNTER — Other Ambulatory Visit: Payer: Self-pay

## 2020-08-16 ENCOUNTER — Encounter: Payer: Self-pay | Admitting: Internal Medicine

## 2020-08-16 ENCOUNTER — Ambulatory Visit (INDEPENDENT_AMBULATORY_CARE_PROVIDER_SITE_OTHER): Payer: Medicare Other | Admitting: Internal Medicine

## 2020-08-16 VITALS — BP 122/72 | HR 80 | Temp 97.9°F | Ht 68.0 in | Wt 200.0 lb

## 2020-08-16 DIAGNOSIS — C61 Malignant neoplasm of prostate: Secondary | ICD-10-CM | POA: Diagnosis not present

## 2020-08-16 DIAGNOSIS — E1169 Type 2 diabetes mellitus with other specified complication: Secondary | ICD-10-CM | POA: Diagnosis not present

## 2020-08-16 DIAGNOSIS — E118 Type 2 diabetes mellitus with unspecified complications: Secondary | ICD-10-CM

## 2020-08-16 DIAGNOSIS — E785 Hyperlipidemia, unspecified: Secondary | ICD-10-CM | POA: Diagnosis not present

## 2020-08-16 DIAGNOSIS — E119 Type 2 diabetes mellitus without complications: Secondary | ICD-10-CM

## 2020-08-16 NOTE — Progress Notes (Signed)
Date:  08/16/2020   Name:  Thomas Allen   DOB:  02-10-1941   MRN:  [id number]   Chief Complaint: Diabetes (Last reading 122)  Diabetes He presents for his follow-up diabetic visit. He has type 2 diabetes mellitus. His disease course has been stable. Pertinent negatives for hypoglycemia include no headaches or tremors. Pertinent negatives for diabetes include no chest pain, no fatigue, no polydipsia and no polyuria. Symptoms are stable. Current diabetic treatment includes oral agent (monotherapy). He is compliant with treatment all of the time. He monitors blood glucose at home 1-2 x per week. There is no change in his home blood glucose trend. His breakfast blood glucose is taken between 7-8 am. His breakfast blood glucose range is generally 110-130 mg/dl. An ACE inhibitor/angiotensin II receptor blocker is being taken. Eye exam is current.  Hyperlipidemia This is a chronic problem. The problem is controlled. Pertinent negatives include no chest pain or shortness of breath. Current antihyperlipidemic treatment includes statins.  Prostate cancer - has recurred with spread to one lymph node.  He is now on Lupron injection q 3 mo and will start an oral medication once it gets approved.  He feels well with no urinary symptoms.  Lab Results  Component Value Date   CREATININE 0.93 12/23/2019   BUN 17 12/23/2019   NA 141 12/23/2019   K 5.2 12/23/2019   CL 104 12/23/2019   CO2 21 12/23/2019   Lab Results  Component Value Date   CHOL 166 12/23/2019   HDL 44 12/23/2019   LDLCALC 101 (H) 12/23/2019   TRIG 115 12/23/2019   CHOLHDL 3.8 12/23/2019   Lab Results  Component Value Date   TSH 2.040 02/05/2017   Lab Results  Component Value Date   HGBA1C 5.9 (H) 04/27/2020   Lab Results  Component Value Date   WBC 8.4 12/23/2019   HGB 15.7 12/23/2019   HCT 45.8 12/23/2019   MCV 98 (H) 12/23/2019   PLT 229 12/23/2019   Lab Results  Component Value Date   ALT 22 12/23/2019   AST 20  12/23/2019   ALKPHOS 73 12/23/2019   BILITOT 0.4 12/23/2019     Review of Systems  Constitutional: Negative for appetite change, fatigue and unexpected weight change.  Eyes: Negative for visual disturbance.  Respiratory: Negative for cough, shortness of breath and wheezing.   Cardiovascular: Negative for chest pain, palpitations and leg swelling.  Gastrointestinal: Negative for abdominal pain and blood in stool.  Endocrine: Negative for polydipsia and polyuria.  Genitourinary: Negative for dysuria, frequency and hematuria.       On new medication for prostate cancer  Skin: Negative for color change and rash.  Neurological: Negative for tremors, numbness and headaches.  Psychiatric/Behavioral: Negative for dysphoric mood.    Patient Active Problem List   Diagnosis Date Noted  . Hand pain, right 08/23/2017  . Erectile dysfunction following radiation therapy 06/26/2017  . Hyperlipidemia associated with type 2 diabetes mellitus ([place]) 03/12/2017  . Asymptomatic PVCs 02/05/2017  . Gastroesophageal reflux disease 02/05/2017  . Alcohol use disorder 01/19/2016  . Type II diabetes mellitus with complication ([place]) 40/98/1191  . Carpal tunnel syndrome 03/30/2015  . Elevated blood pressure, situational 03/30/2015  . Abnormal LFTs 03/30/2015  . H/O adenomatous polyp of colon 03/30/2015  . CA of prostate ([place]) 03/30/2015  . Tobacco use disorder, moderate, in sustained remission 03/30/2015  . Prostate cancer ([place]) 09/02/2013  . Posterior calcaneal exostosis 02/26/2013  . Calcific Achilles tendinitis  02/26/2013    Allergies  Allergen Reactions  . Ace Inhibitors Cough    Past Surgical History:  Procedure Laterality Date  . APPENDECTOMY    . COLONOSCOPY  2008   benign polyps  . COLONOSCOPY WITH PROPOFOL N/A 08/07/2017   Procedure: COLONOSCOPY WITH PROPOFOL;  Surgeon: Lollie Sails, MD;  Location: [facility] ENDOSCOPY;  Service: Endoscopy;  Laterality: N/A;  . ESOPHAGOGASTRODUODENOSCOPY  (EGD) WITH PROPOFOL N/A 08/07/2017   Procedure: ESOPHAGOGASTRODUODENOSCOPY (EGD) WITH PROPOFOL;  Surgeon: Lollie Sails, MD;  Location: [facility] ENDOSCOPY;  Service: Endoscopy;  Laterality: N/A;  . JOINT REPLACEMENT Right 2009  . JOINT REPLACEMENT Left 2015  . lithopexy    . PROSTATE BIOPSY  2012   malignant  . REPLACEMENT TOTAL KNEE Bilateral     Social History   Tobacco Use  . Smoking status: Former Smoker    Quit date: 1976    Years since quitting: 45.9  . Smokeless tobacco: Never Used  Vaping Use  . Vaping Use: Never used  Substance Use Topics  . Alcohol use: Yes    Alcohol/week: 14.0 standard drinks    Types: 14 Shots of liquor per week    Comment: 3 scotch drinks per night  . Drug use: No     Medication list has been reviewed and updated.  Current Meds  Medication Sig  . aspirin 325 MG tablet Take by mouth. Pt does not take daily  . losartan (COZAAR) 25 MG tablet Take 1 tablet (25 mg total) by mouth daily.  . metFORMIN (GLUCOPHAGE) 1000 MG tablet Take 1 tablet (1,000 mg total) by mouth 2 (two) times daily.  . Multiple Vitamins-Minerals (MULTIVITAMIN WITH MINERALS) tablet Take 1 tablet by mouth daily.  Marland Kitchen omeprazole (PRILOSEC) 20 MG capsule TAKE 1 CAPSULE (20 MG TOTAL) BY MOUTH 2 (TWO) TIMES DAILY BEFORE A MEAL. (Patient taking differently: Take 20 mg by mouth daily.)  . pravastatin (PRAVACHOL) 20 MG tablet Take 1 tablet (20 mg total) by mouth daily.    PHQ 2/9 Scores 08/16/2020 12/23/2019 11/26/2019 07/01/2019  PHQ - 2 Score 0 0 0 0  PHQ- 9 Score 0 0 - -    GAD 7 : Generalized Anxiety Score 08/16/2020 12/23/2019  Nervous, Anxious, on Edge 0 0  Control/stop worrying 0 0  Worry too much - different things 0 0  Trouble relaxing 0 0  Restless 0 0  Easily annoyed or irritable 0 0  Afraid - awful might happen 0 0  Total GAD 7 Score 0 0  Anxiety Difficulty - Not difficult at all    BP Readings from Last 3 Encounters:  08/16/20 122/72  04/27/20 118/76  12/23/19  118/70    Physical Exam Vitals and nursing note reviewed.  Constitutional:      General: He is not in acute distress.    Appearance: He is well-developed.  HENT:     Head: Normocephalic and atraumatic.  Neck:     Vascular: No carotid bruit.  Cardiovascular:     Rate and Rhythm: Normal rate and regular rhythm.     Pulses: Normal pulses.  Pulmonary:     Effort: Pulmonary effort is normal. No respiratory distress.     Breath sounds: No wheezing or rhonchi.  Musculoskeletal:     Cervical back: Normal range of motion.     Right lower leg: No edema.     Left lower leg: No edema.  Lymphadenopathy:     Cervical: No cervical adenopathy.  Skin:    General:  Skin is warm and dry.     Findings: No rash.  Neurological:     General: No focal deficit present.     Mental Status: He is alert and oriented to person, place, and time.  Psychiatric:        Mood and Affect: Mood and affect and mood normal.     Wt Readings from Last 3 Encounters:  08/16/20 200 lb (90.7 kg)  04/27/20 196 lb (88.9 kg)  12/23/19 202 lb (91.6 kg)    BP 122/72   Pulse 80   Temp 97.9 F (36.6 C) (Oral)   Ht 5\' 8"  (1.727 m)   Wt 200 lb (90.7 kg)   SpO2 96%   BMI 30.41 kg/m   Assessment and Plan: 1. Type II diabetes mellitus with complication (HCC) Clinically stable by exam and report without s/s of hypoglycemia. DM complicated by lipids. Tolerating medications - metformin once a day -  well without side effects or other concerns. - Hemoglobin T6R - Basic metabolic panel  2. Hyperlipidemia associated with type 2 diabetes mellitus ([place]) Tolerating statin medication without side effects at this time LDL is at goal of < 70 on current dose Continue same therapy without change at this time.  3. Prostate cancer ([place]) Now on Lupron q 19mo and will start oral medication soon Being followed at [facility]   Partially dictated using [employer]. Any errors are unintentional.  Halina Maidens,  MD [facility]  08/16/2020

## 2020-08-17 LAB — BASIC METABOLIC PANEL
BUN/Creatinine Ratio: 23 (ref 10–24)
BUN: 23 mg/dL (ref 8–27)
CO2: 23 mmol/L (ref 20–29)
Calcium: 9.8 mg/dL (ref 8.6–10.2)
Chloride: 104 mmol/L (ref 96–106)
Creatinine, Ser: 0.99 mg/dL (ref 0.76–1.27)
GFR calc Af Amer: 84 mL/min/{1.73_m2} (ref >59)
GFR calc non Af Amer: 73 mL/min/{1.73_m2} (ref >59)
Glucose: 116 mg/dL — ABNORMAL HIGH (ref 65–99)
Potassium: 4.5 mmol/L (ref 3.5–5.2)
Sodium: 143 mmol/L (ref 134–144)

## 2020-08-17 LAB — HEMOGLOBIN A1C
Est. average glucose Bld gHb Est-mCnc: 120 mg/dL
Hgb A1c MFr Bld: 5.8 % — ABNORMAL HIGH (ref 4.8–5.6)

## 2020-09-08 DIAGNOSIS — E118 Type 2 diabetes mellitus with unspecified complications: Secondary | ICD-10-CM | POA: Diagnosis not present

## 2020-09-08 DIAGNOSIS — C772 Secondary and unspecified malignant neoplasm of intra-abdominal lymph nodes: Secondary | ICD-10-CM | POA: Diagnosis not present

## 2020-09-08 DIAGNOSIS — C61 Malignant neoplasm of prostate: Secondary | ICD-10-CM | POA: Diagnosis not present

## 2020-09-10 ENCOUNTER — Other Ambulatory Visit: Payer: Self-pay

## 2020-09-10 ENCOUNTER — Ambulatory Visit (INDEPENDENT_AMBULATORY_CARE_PROVIDER_SITE_OTHER): Payer: Medicare Other | Admitting: Family Medicine

## 2020-09-10 ENCOUNTER — Encounter: Payer: Self-pay | Admitting: Family Medicine

## 2020-09-10 VITALS — BP 132/80 | HR 80 | Ht 68.0 in | Wt 204.0 lb

## 2020-09-10 DIAGNOSIS — R350 Frequency of micturition: Secondary | ICD-10-CM

## 2020-09-10 DIAGNOSIS — R42 Dizziness and giddiness: Secondary | ICD-10-CM

## 2020-09-10 LAB — POCT URINALYSIS DIPSTICK
Bilirubin, UA: NEGATIVE
Blood, UA: NEGATIVE
Glucose, UA: NEGATIVE
Ketones, UA: NEGATIVE
Leukocytes, UA: NEGATIVE
Nitrite, UA: NEGATIVE
Protein, UA: NEGATIVE
Spec Grav, UA: 1.01 (ref 1.010–1.025)
Urobilinogen, UA: 0.2 E.U./dL
pH, UA: 5 (ref 5.0–8.0)

## 2020-09-10 NOTE — Patient Instructions (Signed)

## 2020-09-10 NOTE — Progress Notes (Signed)
Date:  09/10/2020   Name:  Thomas Allen   DOB:  18-Jun-1941   MRN:  [id number]   Chief Complaint: Urinary Tract Infection (Having frequent urination- oncologist wanted him to have a urine sample to check for UTI)  Urinary Frequency  This is a new (upon xandia) problem. The current episode started in the past 7 days. The problem occurs intermittently. The problem has been unchanged. The patient is experiencing no pain. Associated symptoms include frequency and urgency. Pertinent negatives include no chills, discharge, flank pain, hematuria, hesitancy, nausea or sweats. Treatments tried: xandia. The treatment provided mild relief.  Dizziness This is a new problem. The current episode started 1 to 4 weeks ago. The problem occurs intermittently. The problem has been waxing and waning. Pertinent negatives include no abdominal pain, chest pain, chills, coughing, diaphoresis, fever, headaches, myalgias, nausea, neck pain, numbness, rash, sore throat or weakness. The symptoms are aggravated by standing.    Lab Results  Component Value Date   CREATININE 0.99 08/16/2020   BUN 23 08/16/2020   NA 143 08/16/2020   K 4.5 08/16/2020   CL 104 08/16/2020   CO2 23 08/16/2020   Lab Results  Component Value Date   CHOL 166 12/23/2019   HDL 44 12/23/2019   LDLCALC 101 (H) 12/23/2019   TRIG 115 12/23/2019   CHOLHDL 3.8 12/23/2019   Lab Results  Component Value Date   TSH 2.040 02/05/2017   Lab Results  Component Value Date   HGBA1C 5.8 (H) 08/16/2020   Lab Results  Component Value Date   WBC 8.4 12/23/2019   HGB 15.7 12/23/2019   HCT 45.8 12/23/2019   MCV 98 (H) 12/23/2019   PLT 229 12/23/2019   Lab Results  Component Value Date   ALT 22 12/23/2019   AST 20 12/23/2019   ALKPHOS 73 12/23/2019   BILITOT 0.4 12/23/2019     Review of Systems  Constitutional: Negative for chills, diaphoresis and fever.  HENT: Negative for drooling, ear discharge, ear pain and sore throat.    Respiratory: Negative for cough, shortness of breath and wheezing.   Cardiovascular: Negative for chest pain, palpitations and leg swelling.  Gastrointestinal: Negative for abdominal pain, blood in stool, constipation, diarrhea and nausea.  Endocrine: Negative for polydipsia.  Genitourinary: Positive for frequency and urgency. Negative for dysuria, flank pain, hematuria and hesitancy.  Musculoskeletal: Negative for back pain, myalgias and neck pain.  Skin: Negative for rash.  Allergic/Immunologic: Negative for environmental allergies.  Neurological: Positive for dizziness. Negative for weakness, numbness and headaches.  Hematological: Does not bruise/bleed easily.  Psychiatric/Behavioral: Negative for suicidal ideas. The patient is not nervous/anxious.     Patient Active Problem List   Diagnosis Date Noted  . Hand pain, right 08/23/2017  . Erectile dysfunction following radiation therapy 06/26/2017  . Hyperlipidemia associated with type 2 diabetes mellitus (HCC) 03/12/2017  . Asymptomatic PVCs 02/05/2017  . Gastroesophageal reflux disease 02/05/2017  . Alcohol use disorder 01/19/2016  . Type II diabetes mellitus with complication (HCC) 07/26/2015  . Carpal tunnel syndrome 03/30/2015  . Elevated blood pressure, situational 03/30/2015  . Abnormal LFTs 03/30/2015  . H/O adenomatous polyp of colon 03/30/2015  . CA of prostate (HCC) 03/30/2015  . Tobacco use disorder, moderate, in sustained remission 03/30/2015  . Prostate cancer (HCC) 09/02/2013  . Posterior calcaneal exostosis 02/26/2013  . Calcific Achilles tendinitis 02/26/2013    Allergies  Allergen Reactions  . Ace Inhibitors Cough    Past Surgical History:  Procedure Laterality Date  . APPENDECTOMY    . COLONOSCOPY  2008   benign polyps  . COLONOSCOPY WITH PROPOFOL N/A 08/07/2017   Procedure: COLONOSCOPY WITH PROPOFOL;  Surgeon: Lollie Sails, MD;  Location[facility] ENDOSCOPY;  Service: Endoscopy;  Laterality: N/A;   . ESOPHAGOGASTRODUODENOSCOPY (EGD) WITH PROPOFOL N/A 08/07/2017   Procedure: ESOPHAGOGASTRODUODENOSCOPY (EGD) WITH PROPOFOL;  Surgeon: Lollie Sails, MD;  Location: [facility] ENDOSCOPY;  Service: Endoscopy;  Laterality: N/A;  . JOINT REPLACEMENT Right 2009  . JOINT REPLACEMENT Left 2015  . lithopexy    . PROSTATE BIOPSY  2012   malignant  . REPLACEMENT TOTAL KNEE Bilateral     Social History   Tobacco Use  . Smoking status: Former Smoker    Quit date: 1976    Years since quitting: 46.0  . Smokeless tobacco: Never Used  Vaping Use  . Vaping Use: Never used  Substance Use Topics  . Alcohol use: Yes    Alcohol/week: 14.0 standard drinks    Types: 14 Shots of liquor per week    Comment: 3 scotch drinks per night  . Drug use: No     Medication list has been reviewed and updated.  Current Meds  Medication Sig  . aspirin 325 MG tablet Take by mouth. Pt does not take daily  . Leuprolide Acetate, 6 Month, (LUPRON DEPOT, 68-MONTH,) 45 MG injection Inject 45 mg into the muscle every 3 (three) months.  Marland Kitchen losartan (COZAAR) 25 MG tablet Take 1 tablet (25 mg total) by mouth daily.  . metFORMIN (GLUCOPHAGE) 1000 MG tablet Take 1 tablet (1,000 mg total) by mouth 2 (two) times daily.  . Multiple Vitamins-Minerals (MULTIVITAMIN WITH MINERALS) tablet Take 1 tablet by mouth daily.  Marland Kitchen omeprazole (PRILOSEC) 20 MG capsule TAKE 1 CAPSULE (20 MG TOTAL) BY MOUTH 2 (TWO) TIMES DAILY BEFORE A MEAL. (Patient taking differently: Take 20 mg by mouth daily.)  . pravastatin (PRAVACHOL) 20 MG tablet Take 1 tablet (20 mg total) by mouth daily.    PHQ 2/9 Scores 08/16/2020 12/23/2019 11/26/2019 07/01/2019  PHQ - 2 Score 0 0 0 0  PHQ- 9 Score 0 0 - -    GAD 7 : Generalized Anxiety Score 08/16/2020 12/23/2019  Nervous, Anxious, on Edge 0 0  Control/stop worrying 0 0  Worry too much - different things 0 0  Trouble relaxing 0 0  Restless 0 0  Easily annoyed or irritable 0 0  Afraid - awful might happen 0 0   Total GAD 7 Score 0 0  Anxiety Difficulty - Not difficult at all    BP Readings from Last 3 Encounters:  09/10/20 132/80  08/16/20 122/72  04/27/20 118/76    Physical Exam Vitals and nursing note reviewed.  HENT:     Head: Normocephalic.     Right Ear: Tympanic membrane, ear canal and external ear normal.     Left Ear: Tympanic membrane, ear canal and external ear normal.     Nose: Nose normal. No congestion or rhinorrhea.     Mouth/Throat:     Mouth: Oropharynx is clear and moist. Mucous membranes are moist.  Eyes:     General: No scleral icterus.       Right eye: No discharge.        Left eye: No discharge.     Extraocular Movements: EOM normal.     Conjunctiva/sclera: Conjunctivae normal.     Pupils: Pupils are equal, round, and reactive to light.  Neck:  Thyroid: No thyromegaly.     Vascular: No JVD.     Trachea: No tracheal deviation.  Cardiovascular:     Rate and Rhythm: Normal rate and regular rhythm.     Pulses: Intact distal pulses.     Heart sounds: Normal heart sounds. No murmur heard. No friction rub. No gallop.   Pulmonary:     Effort: No respiratory distress.     Breath sounds: Normal breath sounds. No wheezing, rhonchi or rales.  Abdominal:     General: Bowel sounds are normal.     Palpations: Abdomen is soft. There is no hepatosplenomegaly or mass.     Tenderness: There is no abdominal tenderness. There is no CVA tenderness, guarding or rebound.  Musculoskeletal:        General: No tenderness or edema. Normal range of motion.     Cervical back: Normal range of motion and neck supple.  Lymphadenopathy:     Cervical: No cervical adenopathy.  Skin:    General: Skin is warm.     Findings: No rash.  Neurological:     Mental Status: He is alert and oriented to person, place, and time.     Cranial Nerves: No cranial nerve deficit.     Deep Tendon Reflexes: Strength normal and reflexes are normal and symmetric.     Wt Readings from Last 3  Encounters:  09/10/20 204 lb (92.5 kg)  08/16/20 200 lb (90.7 kg)  04/27/20 196 lb (88.9 kg)    BP 132/80   Pulse 80   Ht 5\' 8"  (1.727 m)   Wt 204 lb (92.5 kg)   BMI 31.02 kg/m   Assessment and Plan:  1. Frequent urination Recently diagnosed prostatic cancer for which he is currently on Lupron and Xtandi.  Patient expressed that he was having increased frequency and Xtandi was held until patient is evaluated for urinary tract infection.  Urinalysis is unremarkable and we discussed other reasons for his frequency such as incomplete emptying of the bladder, his third cup of coffee, or overactive bladder.  Patient will discuss this with his urologist in the event that he is having issues with incomplete emptying of the bladder.   - POCT urinalysis dipstick  2. Orthostatic dizziness He is also developed orthostatic dizziness on a regular basis.  Patient's blood pressure is unremarkable at 132/80 and is usually lower by his account.  Patient says that his dizziness only occurs with change in position from lying to sitting sitting to standing and information has been given orthostatic dizziness.  Patient has been forewarned about suddenly jumping up and walking in a the risk of having a near syncopal episode.

## 2020-09-21 DIAGNOSIS — M65342 Trigger finger, left ring finger: Secondary | ICD-10-CM | POA: Diagnosis not present

## 2020-09-21 DIAGNOSIS — M65311 Trigger thumb, right thumb: Secondary | ICD-10-CM | POA: Diagnosis not present

## 2020-10-13 DIAGNOSIS — Z86010 Personal history of colonic polyps: Secondary | ICD-10-CM | POA: Diagnosis not present

## 2020-10-20 ENCOUNTER — Other Ambulatory Visit: Payer: Self-pay | Admitting: Internal Medicine

## 2020-10-20 DIAGNOSIS — E119 Type 2 diabetes mellitus without complications: Secondary | ICD-10-CM

## 2020-11-03 DIAGNOSIS — C772 Secondary and unspecified malignant neoplasm of intra-abdominal lymph nodes: Secondary | ICD-10-CM | POA: Diagnosis not present

## 2020-11-03 DIAGNOSIS — C61 Malignant neoplasm of prostate: Secondary | ICD-10-CM | POA: Diagnosis not present

## 2020-11-03 LAB — HEPATIC FUNCTION PANEL
ALT: 17 (ref 10–40)
AST: 16 (ref 14–40)
Alkaline Phosphatase: 62 (ref 25–125)
Bilirubin, Total: 0.9

## 2020-11-03 LAB — BASIC METABOLIC PANEL
BUN: 17 (ref 4–21)
CO2: 29 — AB (ref 13–22)
Chloride: 102 (ref 99–108)
Creatinine: 0.8 (ref 0.6–1.3)
Glucose: 133
Potassium: 4.1 (ref 3.4–5.3)
Sodium: 139 (ref 137–147)

## 2020-11-03 LAB — CBC AND DIFFERENTIAL
HCT: 50 (ref 41–53)
Hemoglobin: 13.9 (ref 13.5–17.5)
Platelets: 218 (ref 150–399)
WBC: 5.5

## 2020-11-30 ENCOUNTER — Other Ambulatory Visit
Admission: RE | Admit: 2020-11-30 | Discharge: 2020-11-30 | Disposition: A | Discharge status: Home / Self Care | Payer: Medicare Other | Source: Ambulatory Visit | Attending: Gastroenterology | Admitting: Gastroenterology

## 2020-11-30 DIAGNOSIS — Z01812 Encounter for preprocedural laboratory examination: Secondary | ICD-10-CM | POA: Insufficient documentation

## 2020-11-30 DIAGNOSIS — Z20822 Contact with and (suspected) exposure to covid-19: Secondary | ICD-10-CM | POA: Insufficient documentation

## 2020-11-30 LAB — SARS CORONAVIRUS 2 (TAT 6-24 HRS): SARS Coronavirus 2: NEGATIVE

## 2020-12-01 ENCOUNTER — Other Ambulatory Visit: Admission: RE | Admit: 2020-12-01 | Payer: Medicare Other | Source: Ambulatory Visit

## 2020-12-01 ENCOUNTER — Ambulatory Visit: Payer: Medicare Other | Admitting: Internal Medicine

## 2020-12-01 ENCOUNTER — Ambulatory Visit: Payer: Medicare Other

## 2020-12-02 ENCOUNTER — Encounter: Payer: Self-pay | Admitting: *Deleted

## 2020-12-03 ENCOUNTER — Encounter
Admission: RE | Disposition: A | Discharge status: Home / Self Care | Payer: Self-pay | Source: Home / Self Care | Attending: Gastroenterology

## 2020-12-03 ENCOUNTER — Ambulatory Visit: Payer: Medicare Other | Admitting: Anesthesiology

## 2020-12-03 ENCOUNTER — Other Ambulatory Visit: Payer: Self-pay

## 2020-12-03 ENCOUNTER — Encounter: Payer: Self-pay | Admitting: *Deleted

## 2020-12-03 ENCOUNTER — Ambulatory Visit
Admission: RE | Admit: 2020-12-03 | Discharge: 2020-12-03 | Disposition: A | Discharge status: Home / Self Care | Payer: Medicare Other | Attending: Gastroenterology | Admitting: Gastroenterology

## 2020-12-03 DIAGNOSIS — Z7982 Long term (current) use of aspirin: Secondary | ICD-10-CM | POA: Diagnosis not present

## 2020-12-03 DIAGNOSIS — E119 Type 2 diabetes mellitus without complications: Secondary | ICD-10-CM | POA: Insufficient documentation

## 2020-12-03 DIAGNOSIS — Z800 Family history of malignant neoplasm of digestive organs: Secondary | ICD-10-CM | POA: Insufficient documentation

## 2020-12-03 DIAGNOSIS — Z7984 Long term (current) use of oral hypoglycemic drugs: Secondary | ICD-10-CM | POA: Diagnosis not present

## 2020-12-03 DIAGNOSIS — I10 Essential (primary) hypertension: Secondary | ICD-10-CM | POA: Insufficient documentation

## 2020-12-03 DIAGNOSIS — Z888 Allergy status to other drugs, medicaments and biological substances status: Secondary | ICD-10-CM | POA: Insufficient documentation

## 2020-12-03 DIAGNOSIS — K640 First degree hemorrhoids: Secondary | ICD-10-CM | POA: Diagnosis not present

## 2020-12-03 DIAGNOSIS — D123 Benign neoplasm of transverse colon: Secondary | ICD-10-CM | POA: Insufficient documentation

## 2020-12-03 DIAGNOSIS — K219 Gastro-esophageal reflux disease without esophagitis: Secondary | ICD-10-CM | POA: Diagnosis not present

## 2020-12-03 DIAGNOSIS — Z1211 Encounter for screening for malignant neoplasm of colon: Secondary | ICD-10-CM | POA: Diagnosis not present

## 2020-12-03 DIAGNOSIS — G4730 Sleep apnea, unspecified: Secondary | ICD-10-CM | POA: Diagnosis not present

## 2020-12-03 DIAGNOSIS — Z79899 Other long term (current) drug therapy: Secondary | ICD-10-CM | POA: Insufficient documentation

## 2020-12-03 DIAGNOSIS — Z8546 Personal history of malignant neoplasm of prostate: Secondary | ICD-10-CM | POA: Insufficient documentation

## 2020-12-03 DIAGNOSIS — K5730 Diverticulosis of large intestine without perforation or abscess without bleeding: Secondary | ICD-10-CM | POA: Diagnosis not present

## 2020-12-03 DIAGNOSIS — K649 Unspecified hemorrhoids: Secondary | ICD-10-CM | POA: Diagnosis not present

## 2020-12-03 DIAGNOSIS — K5790 Diverticulosis of intestine, part unspecified, without perforation or abscess without bleeding: Secondary | ICD-10-CM | POA: Diagnosis not present

## 2020-12-03 DIAGNOSIS — D122 Benign neoplasm of ascending colon: Secondary | ICD-10-CM | POA: Diagnosis not present

## 2020-12-03 DIAGNOSIS — Z8249 Family history of ischemic heart disease and other diseases of the circulatory system: Secondary | ICD-10-CM | POA: Diagnosis not present

## 2020-12-03 DIAGNOSIS — K635 Polyp of colon: Secondary | ICD-10-CM | POA: Diagnosis not present

## 2020-12-03 DIAGNOSIS — Z86010 Personal history of colonic polyps: Secondary | ICD-10-CM | POA: Diagnosis not present

## 2020-12-03 DIAGNOSIS — E785 Hyperlipidemia, unspecified: Secondary | ICD-10-CM | POA: Diagnosis not present

## 2020-12-03 HISTORY — PX: COLONOSCOPY WITH PROPOFOL: SHX5780

## 2020-12-03 LAB — GLUCOSE, CAPILLARY: Glucose-Capillary: 146 mg/dL — ABNORMAL HIGH (ref 70–99)

## 2020-12-03 SURGERY — COLONOSCOPY WITH PROPOFOL
Anesthesia: General

## 2020-12-03 MED ORDER — SODIUM CHLORIDE 0.9 % IV SOLN
INTRAVENOUS | Status: DC
  Administered 2020-12-03: 1000 mL via INTRAVENOUS

## 2020-12-03 MED ORDER — PROPOFOL 10 MG/ML IV BOLUS
INTRAVENOUS | Status: DC | PRN
  Administered 2020-12-03: 70 mg via INTRAVENOUS

## 2020-12-03 MED ORDER — PROPOFOL 500 MG/50ML IV EMUL
INTRAVENOUS | Status: DC | PRN
  Administered 2020-12-03: 140 ug/kg/min via INTRAVENOUS

## 2020-12-03 MED ORDER — PROPOFOL 500 MG/50ML IV EMUL
INTRAVENOUS | Status: AC
  Filled 2020-12-03: qty 50

## 2020-12-03 NOTE — Anesthesia Preprocedure Evaluation (Signed)
Anesthesia Evaluation  Patient identified by MRN, date of birth, ID band Patient awake    Reviewed: Allergy & Precautions, NPO status , Patient's Chart, lab work & pertinent test results  History of Anesthesia Complications Negative for: history of anesthetic complications  Airway Mallampati: II  TM Distance: >3 FB Neck ROM: Full    Dental  (+) Upper Dentures   Pulmonary sleep apnea (diagnosed in the past but then retested and was negative) , former smoker,    breath sounds clear to auscultation- rhonchi (-) wheezing      Cardiovascular Exercise Tolerance: Good hypertension, Pt. on medications (-) CAD, (-) Past MI, (-) Cardiac Stents and (-) CABG  Rhythm:Regular Rate:Normal - Systolic murmurs and - Diastolic murmurs    Neuro/Psych neg Seizures negative neurological ROS  negative psych ROS   GI/Hepatic Neg liver ROS, GERD  ,  Endo/Other  diabetes, Oral Hypoglycemic Agents  Renal/GU negative Renal ROS     Musculoskeletal  (+) Arthritis ,   Abdominal (+) + obese,   Peds  Hematology negative hematology ROS (+)   Anesthesia Other Findings Past Medical History: No date: Arthritis No date: Cancer (HCC) No date: Diabetes mellitus without complication (HCC) No date: GERD (gastroesophageal reflux disease) No date: Haglund's deformity No date: History of kidney stones No date: Hyperlipidemia No date: Hypertension 02/05/2017: Intractable hiccups No date: Prostate CA ([place]) No date: Sleep apnea   Reproductive/Obstetrics                             Anesthesia Physical Anesthesia Plan  ASA: III  Anesthesia Plan: General   Post-op Pain Management:    Induction: Intravenous  PONV Risk Score and Plan: 1 and Propofol infusion  Airway Management Planned: Natural Airway  Additional Equipment:   Intra-op Plan:   Post-operative Plan:   Informed Consent: I have reviewed the patients History  and Physical, chart, labs and discussed the procedure including the risks, benefits and alternatives for the proposed anesthesia with the patient or authorized representative who has indicated his/her understanding and acceptance.     Dental advisory given  Plan Discussed with: CRNA and Anesthesiologist  Anesthesia Plan Comments:         Anesthesia Quick Evaluation

## 2020-12-03 NOTE — Anesthesia Procedure Notes (Signed)
Date/Time: 12/03/2020 10:47 AM Performed by: Gentry Fitz, CRNA Pre-anesthesia Checklist: Patient identified, Emergency Drugs available, Suction available, Patient being monitored and Timeout performed Patient Re-evaluated:Patient Re-evaluated prior to induction Oxygen Delivery Method: Nasal cannula Preoxygenation: Pre-oxygenation with 100% oxygen Induction Type: IV induction

## 2020-12-03 NOTE — Transfer of Care (Signed)
Immediate Anesthesia Transfer of Care Note  Patient: Hoyle N Soloway  Procedure(s) Performed: COLONOSCOPY WITH PROPOFOL (N/A )  Patient Location: PACU  Anesthesia Type:General  Level of Consciousness: drowsy  Airway & Oxygen Therapy: Patient Spontanous Breathing and Patient connected to nasal cannula oxygen  Post-op Assessment: Report given to RN and Post -op Vital signs reviewed and stable  Post vital signs: Reviewed and stable  Last Vitals:  Vitals Value Taken Time  BP 116/59 12/03/20 1113  Temp 36.1 C 12/03/20 1112  Pulse 78 12/03/20 1113  Resp 24 12/03/20 1113  SpO2 96 % 12/03/20 1113  Vitals shown include unvalidated device data.  Last Pain:  Vitals:   12/03/20 1112  TempSrc: Temporal  PainSc: Asleep         Complications: No complications documented.

## 2020-12-03 NOTE — Op Note (Signed)
[facility] Gastroenterology Patient Name: Thomas Allen Procedure Date: 12/03/2020 10:39 AM MRN: [id number] Account #: [id number] Date of Birth: [birth year] Admit Type: Outpatient Age: 80 Room: [facility] ENDO ROOM 3 Gender: Male Note Status: Finalized Procedure:             Colonoscopy Indications:           High risk colon cancer surveillance: Personal history                         of multiple (3 or more) adenomas Providers:             Andrey Farmer MD, MD Medicines:             Monitored Anesthesia Care Complications:         No immediate complications. Estimated blood loss:                         Minimal. Procedure:             Pre-Anesthesia Assessment:                        - Prior to the procedure, a History and Physical was                         performed, and patient medications and allergies were                         reviewed. The patient is competent. The risks and                         benefits of the procedure and the sedation options and                         risks were discussed with the patient. All questions                         were answered and informed consent was obtained.                         Patient identification and proposed procedure were                         verified by the physician, the nurse, the anesthetist                         and the technician in the endoscopy suite. Mental                         Status Examination: alert and oriented. Airway                         Examination: normal oropharyngeal airway and neck                         mobility. Respiratory Examination: clear to                         auscultation. CV Examination: normal. Prophylactic  Antibiotics: The patient does not require prophylactic                         antibiotics. Prior Anticoagulants: The patient has                         taken no previous anticoagulant or antiplatelet                         agents.  ASA Grade Assessment: III - A patient with                         severe systemic disease. After reviewing the risks and                         benefits, the patient was deemed in satisfactory                         condition to undergo the procedure. The anesthesia                         plan was to use monitored anesthesia care (MAC).                         Immediately prior to administration of medications,                         the patient was re-assessed for adequacy to receive                         sedatives. The heart rate, respiratory rate, oxygen                         saturations, blood pressure, adequacy of pulmonary                         ventilation, and response to care were monitored                         throughout the procedure. The physical status of the                         patient was re-assessed after the procedure.                        After obtaining informed consent, the colonoscope was                         passed under direct vision. Throughout the procedure,                         the patient's blood pressure, pulse, and oxygen                         saturations were monitored continuously. The                         Colonoscope was introduced through the anus and  advanced to the the cecum, identified by appendiceal                         orifice and ileocecal valve. The colonoscopy was                         performed without difficulty. The patient tolerated                         the procedure well. The quality of the bowel                         preparation was adequate to identify polyps. Findings:      The perianal and digital rectal examinations were normal.      Many small and large-mouthed diverticula were found in the sigmoid       colon, descending colon, splenic flexure, transverse colon, hepatic       flexure, ascending colon and cecum.      A 2 mm polyp was found in the ascending colon. The polyp  was sessile.       The polyp was removed with a jumbo cold forceps. Resection and retrieval       were complete. Estimated blood loss was minimal.      A 3 mm polyp was found in the transverse colon. The polyp was sessile.       The polyp was removed with a cold snare. Resection and retrieval were       complete. Estimated blood loss was minimal.      Internal hemorrhoids were found during retroflexion. The hemorrhoids       were Grade I (internal hemorrhoids that do not prolapse).      The exam was otherwise without abnormality on direct and retroflexion       views. Impression:            - Diverticulosis in the sigmoid colon, in the                         descending colon, at the splenic flexure, in the                         transverse colon, at the hepatic flexure, in the                         ascending colon and in the cecum.                        - One 2 mm polyp in the ascending colon, removed with                         a jumbo cold forceps. Resected and retrieved.                        - One 3 mm polyp in the transverse colon, removed with                         a cold snare. Resected and retrieved.                        -  Internal hemorrhoids.                        - The examination was otherwise normal on direct and                         retroflexion views. Recommendation:        - Discharge patient to home.                        - Resume previous diet.                        - Continue present medications.                        - Await pathology results.                        - Repeat colonoscopy in 5 years for surveillance.                        - Return to referring physician as previously                         scheduled. Procedure Code(s):     --- Professional ---                        [phone], Colonoscopy, flexible; with removal of                         tumor(s), polyp(s), or other lesion(s) by snare                         technique                         [id number], 49, Colonoscopy, flexible; with biopsy, single                         or multiple Diagnosis Code(s):     --- Professional ---                        Z86.010, Personal history of colonic polyps                        K64.0, First degree hemorrhoids                        K63.5, Polyp of colon                        K57.30, Diverticulosis of large intestine without                         perforation or abscess without bleeding CPT copyright 2019 American Medical Association. All rights reserved. The codes documented in this report are preliminary and upon coder review may  be revised to meet current compliance requirements. Andrey Farmer MD, MD 12/03/2020 11:14:10 AM Number of Addenda: 0 Note Initiated On: 12/03/2020 10:39 AM Scope Withdrawal Time: 0 hours 11 minutes 3 seconds  Total Procedure  Duration: 0 hours 18 minutes 4 seconds  Estimated Blood Loss:  Estimated blood loss was minimal.      [facility]

## 2020-12-03 NOTE — H&P (Signed)
Outpatient short stay form Pre-procedure 12/03/2020 10:39 AM Raylene Miyamoto MD, MPH  Primary Physician: Dr. Army Melia  Reason for visit:  History of polyps  History of present illness:   80 y/o gentleman with history of DM II and hypertension and family history of colon cancer in parents older than 59. Had colonoscopy in 2018 with > 3 TA's. History of appendectomy.    Current Facility-Administered Medications:  .  0.9 %  sodium chloride infusion, , Intravenous, Continuous, Shabre Kreher, Hilton Cork, MD, Last Rate: 20 mL/hr at 12/03/20 1019, 1,000 mL at 12/03/20 1019  Medications Prior to Admission  Medication Sig Dispense Refill Last Dose  . aspirin 325 MG tablet Take by mouth. Pt does not take daily   Past Week at Unknown time  . Leuprolide Acetate, 6 Month, (LUPRON DEPOT, 45-MONTH,) 45 MG injection Inject 45 mg into the muscle every 3 (three) months.   Past Week at Unknown time  . losartan (COZAAR) 25 MG tablet Take 1 tablet (25 mg total) by mouth daily. 90 tablet 3 Past Week at Unknown time  . metFORMIN (GLUCOPHAGE) 1000 MG tablet TAKE 1 TABLET BY MOUTH TWICE A DAY 180 tablet 0 12/02/2020 at Unknown time  . Multiple Vitamins-Minerals (MULTIVITAMIN WITH MINERALS) tablet Take 1 tablet by mouth daily.   Past Week at Unknown time  . omeprazole (PRILOSEC) 20 MG capsule TAKE 1 CAPSULE (20 MG TOTAL) BY MOUTH 2 (TWO) TIMES DAILY BEFORE A MEAL. (Patient taking differently: Take 20 mg by mouth daily.) 180 capsule 2 Past Week at Unknown time  . pravastatin (PRAVACHOL) 20 MG tablet Take 1 tablet (20 mg total) by mouth daily. 90 tablet 1 Past Week at Unknown time  . enzalutamide (XTANDI) 40 MG capsule Take by mouth. (Patient not taking: Reported on 09/10/2020)        Allergies  Allergen Reactions  . Ace Inhibitors Cough     Past Medical History:  Diagnosis Date  . Arthritis   . Cancer ([place])   . Diabetes mellitus without complication ([place])   . GERD (gastroesophageal reflux disease)   . Haglund's  deformity   . History of kidney stones   . Hyperlipidemia   . Hypertension   . Intractable hiccups 02/05/2017  . Prostate CA ([place])   . Sleep apnea     Review of systems:  Otherwise negative.    Physical Exam  Gen: Alert, oriented. Appears stated age.  HEENT: PERRLA. Lungs: No respiratory distress CV: RRR Abd: soft, benign, no masses Ext: No edema    Planned procedures: Proceed with colonoscopy. The patient understands the nature of the planned procedure, indications, risks, alternatives and potential complications including but not limited to bleeding, infection, perforation, damage to internal organs and possible oversedation/side effects from anesthesia. The patient agrees and gives consent to proceed.  Please refer to procedure notes for findings, recommendations and patient disposition/instructions.     Raylene Miyamoto MD, MPH Gastroenterology 12/03/2020  10:39 AM

## 2020-12-03 NOTE — Anesthesia Postprocedure Evaluation (Signed)
Anesthesia Post Note  Patient: Thomas Allen  Procedure(s) Performed: COLONOSCOPY WITH PROPOFOL (N/A )  Patient location during evaluation: Endoscopy Anesthesia Type: General Level of consciousness: awake and alert and oriented Pain management: pain level controlled Vital Signs Assessment: post-procedure vital signs reviewed and stable Respiratory status: spontaneous breathing, nonlabored ventilation and respiratory function stable Cardiovascular status: blood pressure returned to baseline and stable Postop Assessment: no signs of nausea or vomiting Anesthetic complications: no   No complications documented.   Last Vitals:  Vitals:   12/03/20 1112 12/03/20 1122  BP: (!) 116/59 110/63  Pulse:    Resp: (!) 24   Temp: (!) 36.1 C   SpO2: 96%     Last Pain:  Vitals:   12/03/20 1132  TempSrc:   PainSc: 0-No pain                 Jalaina Salyers

## 2020-12-03 NOTE — Interval H&P Note (Signed)
History and Physical Interval Note:  12/03/2020 10:42 AM  Thomas Allen  has presented today for surgery, with the diagnosis of PERSONAL HX.OF POLYPS.  The various methods of treatment have been discussed with the patient and family. After consideration of risks, benefits and other options for treatment, the patient has consented to  Procedure(s): COLONOSCOPY WITH PROPOFOL (N/A) as a surgical intervention.  The patient's history has been reviewed, patient examined, no change in status, stable for surgery.  I have reviewed the patient's chart and labs.  Questions were answered to the patient's satisfaction.     Lesly Rubenstein  Ok to proceed with colonoscopy

## 2020-12-06 LAB — SURGICAL PATHOLOGY

## 2020-12-10 ENCOUNTER — Ambulatory Visit: Payer: Medicare Other | Admitting: Internal Medicine

## 2020-12-13 ENCOUNTER — Ambulatory Visit: Payer: Medicare Other | Admitting: Internal Medicine

## 2020-12-13 ENCOUNTER — Ambulatory Visit (INDEPENDENT_AMBULATORY_CARE_PROVIDER_SITE_OTHER): Payer: Medicare Other

## 2020-12-13 DIAGNOSIS — Z0000 Encounter for general adult medical examination without abnormal findings: Secondary | ICD-10-CM

## 2020-12-13 NOTE — Patient Instructions (Signed)
Thomas Allen , Thank you for taking time to come for your Medicare Wellness Visit. I appreciate your ongoing commitment to your health goals. Please review the following plan we discussed and let me know if I can assist you in the future.   Screening recommendations/referrals: Colonoscopy: done 12/03/20 Recommended yearly ophthalmology/optometry visit for glaucoma screening and checkup Recommended yearly dental visit for hygiene and checkup  Vaccinations: Influenza vaccine: declined Pneumococcal vaccine: declined Tdap vaccine: due Shingles vaccine: Shingrix discussed. Please contact your pharmacy for coverage information.  Covid-19:  Done 11/24/19 7 12/17/19  Conditions/risks identified: recommend drinking 6-8 glasses of water  Next appointment: Follow up in one year for your annual wellness visit.   Preventive Care 79 Years and Older, Male Preventive care refers to lifestyle choices and visits with your health care provider that can promote health and wellness. What does preventive care include?  A yearly physical exam. This is also called an annual well check.  Dental exams once or twice a year.  Routine eye exams. Ask your health care provider how often you should have your eyes checked.  Personal lifestyle choices, including:  Daily care of your teeth and gums.  Regular physical activity.  Eating a healthy diet.  Avoiding tobacco and drug use.  Limiting alcohol use.  Practicing safe sex.  Taking low doses of aspirin every day.  Taking vitamin and mineral supplements as recommended by your health care provider. What happens during an annual well check? The services and screenings done by your health care provider during your annual well check will depend on your age, overall health, lifestyle risk factors, and family history of disease. Counseling  Your health care provider may ask you questions about your:  Alcohol use.  Tobacco use.  Drug use.  Emotional  well-being.  Home and relationship well-being.  Sexual activity.  Eating habits.  History of falls.  Memory and ability to understand (cognition).  Work and work Statistician. Screening  You may have the following tests or measurements:  Height, weight, and BMI.  Blood pressure.  Lipid and cholesterol levels. These may be checked every 5 years, or more frequently if you are over 28 years old.  Skin check.  Lung cancer screening. You may have this screening every year starting at age 65 if you have a 30-pack-year history of smoking and currently smoke or have quit within the past 15 years.  Fecal occult blood test (FOBT) of the stool. You may have this test every year starting at age 37.  Flexible sigmoidoscopy or colonoscopy. You may have a sigmoidoscopy every 5 years or a colonoscopy every 10 years starting at age 57.  Prostate cancer screening. Recommendations will vary depending on your family history and other risks.  Hepatitis C blood test.  Hepatitis B blood test.  Sexually transmitted disease (STD) testing.  Diabetes screening. This is done by checking your blood sugar (glucose) after you have not eaten for a while (fasting). You may have this done every 1-3 years.  Abdominal aortic aneurysm (AAA) screening. You may need this if you are a current or former smoker.  Osteoporosis. You may be screened starting at age 79 if you are at high risk. Talk with your health care provider about your test results, treatment options, and if necessary, the need for more tests. Vaccines  Your health care provider may recommend certain vaccines, such as:  Influenza vaccine. This is recommended every year.  Tetanus, diphtheria, and acellular pertussis (Tdap, Td) vaccine. You may  need a Td booster every 10 years.  Zoster vaccine. You may need this after age 16.  Pneumococcal 13-valent conjugate (PCV13) vaccine. One dose is recommended after age 79.  Pneumococcal  polysaccharide (PPSV23) vaccine. One dose is recommended after age 79. Talk to your health care provider about which screenings and vaccines you need and how often you need them. This information is not intended to replace advice given to you by your health care provider. Make sure you discuss any questions you have with your health care provider. Document Released: 09/17/2015 Document Revised: 05/10/2016 Document Reviewed: 06/22/2015 Elsevier Interactive Patient Education  2017 [place] Prevention in the Home Falls can cause injuries. They can happen to people of all ages. There are many things you can do to make your home safe and to help prevent falls. What can I do on the outside of my home?  Regularly fix the edges of walkways and driveways and fix any cracks.  Remove anything that might make you trip as you walk through a door, such as a raised step or threshold.  Trim any bushes or trees on the path to your home.  Use bright outdoor lighting.  Clear any walking paths of anything that might make someone trip, such as rocks or tools.  Regularly check to see if handrails are loose or broken. Make sure that both sides of any steps have handrails.  Any raised decks and porches should have guardrails on the edges.  Have any leaves, snow, or ice cleared regularly.  Use sand or salt on walking paths during winter.  Clean up any spills in your garage right away. This includes oil or grease spills. What can I do in the bathroom?  Use night lights.  Install grab bars by the toilet and in the tub and shower. Do not use towel bars as grab bars.  Use non-skid mats or decals in the tub or shower.  If you need to sit down in the shower, use a plastic, non-slip stool.  Keep the floor dry. Clean up any water that spills on the floor as soon as it happens.  Remove soap buildup in the tub or shower regularly.  Attach bath mats securely with double-sided non-slip rug  tape.  Do not have throw rugs and other things on the floor that can make you trip. What can I do in the bedroom?  Use night lights.  Make sure that you have a light by your bed that is easy to reach.  Do not use any sheets or blankets that are too big for your bed. They should not hang down onto the floor.  Have a firm chair that has side arms. You can use this for support while you get dressed.  Do not have throw rugs and other things on the floor that can make you trip. What can I do in the kitchen?  Clean up any spills right away.  Avoid walking on wet floors.  Keep items that you use a lot in easy-to-reach places.  If you need to reach something above you, use a strong step stool that has a grab bar.  Keep electrical cords out of the way.  Do not use floor polish or wax that makes floors slippery. If you must use wax, use non-skid floor wax.  Do not have throw rugs and other things on the floor that can make you trip. What can I do with my stairs?  Do not leave any items on  the stairs.  Make sure that there are handrails on both sides of the stairs and use them. Fix handrails that are broken or loose. Make sure that handrails are as long as the stairways.  Check any carpeting to make sure that it is firmly attached to the stairs. Fix any carpet that is loose or worn.  Avoid having throw rugs at the top or bottom of the stairs. If you do have throw rugs, attach them to the floor with carpet tape.  Make sure that you have a light switch at the top of the stairs and the bottom of the stairs. If you do not have them, ask someone to add them for you. What else can I do to help prevent falls?  Wear shoes that:  Do not have high heels.  Have rubber bottoms.  Are comfortable and fit you well.  Are closed at the toe. Do not wear sandals.  If you use a stepladder:  Make sure that it is fully opened. Do not climb a closed stepladder.  Make sure that both sides of the  stepladder are locked into place.  Ask someone to hold it for you, if possible.  Clearly mark and make sure that you can see:  Any grab bars or handrails.  First and last steps.  Where the edge of each step is.  Use tools that help you move around (mobility aids) if they are needed. These include:  Canes.  Walkers.  Scooters.  Crutches.  Turn on the lights when you go into a dark area. Replace any light bulbs as soon as they burn out.  Set up your furniture so you have a clear path. Avoid moving your furniture around.  If any of your floors are uneven, fix them.  If there are any pets around you, be aware of where they are.  Review your medicines with your doctor. Some medicines can make you feel dizzy. This can increase your chance of falling. Ask your doctor what other things that you can do to help prevent falls. This information is not intended to replace advice given to you by your health care provider. Make sure you discuss any questions you have with your health care provider. Document Released: 06/17/2009 Document Revised: 01/27/2016 Document Reviewed: 09/25/2014 Elsevier Interactive Patient Education  2017 [employer].

## 2020-12-13 NOTE — Progress Notes (Signed)
Subjective:   Thomas Allen is a 80 y.o. male who presents for Medicare Annual/Subsequent preventive examination.  Virtual Visit via Telephone Note  I connected with  Thomas Allen on 12/13/20 at  2:00 PM EDT by telephone and verified that I am speaking with the correct person using two identifiers.  Location: Patient: home Provider: [facility] Persons participating in the virtual visit: [place]   I discussed the limitations, risks, security and privacy concerns of performing an evaluation and management service by telephone and the availability of in person appointments. The patient expressed understanding and agreed to proceed.  Interactive audio and video telecommunications were attempted between this nurse and patient, however failed, due to patient having technical difficulties OR patient did not have access to video capability.  We continued and completed visit with audio only.  Some vital signs may be absent or patient reported.   Clemetine Marker, LPN    Review of Systems     Cardiac Risk Factors include: advanced age (>37men, >44 women);male gender;hypertension;diabetes mellitus;dyslipidemia     Objective:    There were no vitals filed for this visit. There is no height or weight on file to calculate BMI.  Advanced Directives 12/13/2020 12/03/2020 11/26/2019 11/11/2018 08/07/2017  Does Patient Have a Medical Advance Directive? Yes Yes Yes Yes Yes  Type of Paramedic of [place];Living will - [place];Living will Living will;Healthcare Power of [place];Living will  Copy of [place] in Chart? Yes - validated most recent copy scanned in chart (See row information) - No - copy requested Yes - validated most recent copy scanned in chart (See row information) No - copy requested    Current Medications (verified) Outpatient Encounter Medications as of 12/13/2020  Medication Sig   . aspirin 325 MG tablet Take by mouth. Pt does not take daily  . enzalutamide (XTANDI) 40 MG capsule Take by mouth.  . Leuprolide Acetate, 6 Month, (LUPRON DEPOT, 47-MONTH,) 45 MG injection Inject 45 mg into the muscle every 3 (three) months.  Marland Kitchen losartan (COZAAR) 25 MG tablet Take 1 tablet (25 mg total) by mouth daily.  . metFORMIN (GLUCOPHAGE) 1000 MG tablet TAKE 1 TABLET BY MOUTH TWICE A DAY  . Multiple Vitamins-Minerals (MULTIVITAMIN WITH MINERALS) tablet Take 1 tablet by mouth daily.  Marland Kitchen omeprazole (PRILOSEC) 20 MG capsule TAKE 1 CAPSULE (20 MG TOTAL) BY MOUTH 2 (TWO) TIMES DAILY BEFORE A MEAL. (Patient taking differently: Take 20 mg by mouth daily.)  . pravastatin (PRAVACHOL) 20 MG tablet Take 1 tablet (20 mg total) by mouth daily.   No facility-administered encounter medications on file as of 12/13/2020.    Allergies (verified) Ace inhibitors   History: Past Medical History:  Diagnosis Date  . Arthritis   . Cancer ([place])   . Diabetes mellitus without complication ([place])   . GERD (gastroesophageal reflux disease)   . Haglund's deformity   . History of kidney stones   . Hyperlipidemia   . Hypertension   . Intractable hiccups 02/05/2017  . Prostate CA ([place])   . Sleep apnea    Past Surgical History:  Procedure Laterality Date  . APPENDECTOMY    . COLONOSCOPY  2008   benign polyps  . COLONOSCOPY WITH PROPOFOL N/A 08/07/2017   Procedure: COLONOSCOPY WITH PROPOFOL;  Surgeon: Lollie Sails, MD;  Location: [facility] ENDOSCOPY;  Service: Endoscopy;  Laterality: N/A;  . COLONOSCOPY WITH PROPOFOL N/A 12/03/2020   Procedure: COLONOSCOPY  WITH PROPOFOL;  Surgeon: Lesly Rubenstein, MD;  Location[facility] ENDOSCOPY;  Service: Endoscopy;  Laterality: N/A;  . ESOPHAGOGASTRODUODENOSCOPY (EGD) WITH PROPOFOL N/A 08/07/2017   Procedure: ESOPHAGOGASTRODUODENOSCOPY (EGD) WITH PROPOFOL;  Surgeon: Lollie Sails, MD;  Location: [facility] ENDOSCOPY;  Service: Endoscopy;  Laterality: N/A;  . JOINT  REPLACEMENT Right 2009  . JOINT REPLACEMENT Left 2015  . lithopexy    . PROSTATE BIOPSY  2012   malignant  . REPLACEMENT TOTAL KNEE Bilateral    Family History  Problem Relation Age of Onset  . Diabetes Mother   . Colon cancer Mother   . Colon cancer Father   . Prostate cancer Neg Hx   . Bladder Cancer Neg Hx   . Kidney cancer Neg Hx    Social History   Socioeconomic History  . Marital status: Married    Spouse name: Not on file  . Number of children: 3  . Years of education: Not on file  . 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 Smoking status: Former Smoker    Quit date: 1976    Years since quitting: 46.3  . Smokeless tobacco: Never Used  Vaping Use  . Vaping Use: Never used  Substance and Sexual Activity  . Alcohol use: Yes    Alcohol/week: 14.0 standard drinks    Types: 14 Shots of liquor per week    Comment: 2 scotch drinks per night  . Drug use: No  . Sexual activity: Not on file  Other Topics Concern  . Not on file  Social History Narrative  . Not on file   Social Determinants of Health   Financial Resource Strain: Low Risk   . Difficulty of Paying Living Expenses: Not hard at all  Food Insecurity: No Food Insecurity  . Worried About Charity fundraiser in the Last Year: Never true  . Ran Out of Food in the Last Year: Never true  Transportation Needs: No Transportation Needs  . Lack of Transportation (Medical): No  . Lack of Transportation (Non-Medical): No  Physical Activity: Sufficiently Active  . Days of Exercise per Week: 5 days  . Minutes of Exercise per Session: 30 min  Stress: No Stress Concern Present  . Feeling of Stress : Not at all  Social Connections: Moderately Integrated  . Frequency of Communication with Friends and Family: More than three times a week  . Frequency of Social Gatherings with Friends and Family: Once a week   . Attends Religious Services: More than 4 times per year  . Active Member of Clubs or Organizations: No  . Attends Archivist Meetings: Never  . Marital Status: Married    Tobacco Counseling Counseling given: Not Answered   Clinical Intake:  Pre-visit preparation completed: Yes  Pain : No/denies pain     Nutritional Risks: None Diabetes: No  How often do you need to have someone help you when you read instructions, pamphlets, or other written materials from your doctor or pharmacy?: 1 - Never  Nutrition Risk Assessment:  Has the patient had any N/V/D within the last 2 months?  No  Does the patient have any non-healing wounds?  No  Has the patient had any unintentional weight loss or weight gain?  No   Diabetes:  Is the patient diabetic?  Yes  If diabetic, was a CBG obtained today?  No  Did  the patient bring in their glucometer from home?  No  How often do you monitor your CBG's? occassionally per patient. .   Financial Strains and Diabetes Management:  Are you having any financial strains with the device, your supplies or your medication? No .  Does the patient want to be seen by Chronic Care Management for management of their diabetes?  No  Would the patient like to be referred to a Nutritionist or for Diabetic Management?  No   Diabetic Exams:  Diabetic Eye Exam: Completed 12/26/19 negative retinopathy.   Diabetic Foot Exam: Completed 12/23/19. Pt has been advised about the importance in completing this exam. Pt is scheduled for diabetic foot exam on 12/15/20..    Interpreter Needed?: No  Information entered by :: Clemetine Marker LPN   Activities of Daily Living In your present state of health, do you have any difficulty performing the following activities: 12/13/2020  Hearing? N  Comment declines hearing aids  Vision? N  Difficulty concentrating or making decisions? N  Walking or climbing stairs? N  Dressing or bathing? N  Doing errands, shopping?  N  Preparing Food and eating ? N  Using the Toilet? N  In the past six months, have you accidently leaked urine? Y  Do you have problems with loss of bowel control? N  Managing your Medications? N  Managing your Finances? N  Housekeeping or managing your Housekeeping? N  Some recent data might be hidden    Patient Care Team: Glean Hess, MD as PCP - General (Internal Medicine) Anell Barr, OD (Optometry)  Indicate any recent Medical Services you may have received from other than [facility] providers in the past year (date may be approximate).     Assessment:   This is a routine wellness examination for [place].  Hearing/Vision screen  Hearing Screening   125Hz  250Hz  500Hz  1000Hz  2000Hz  3000Hz  4000Hz  6000Hz  8000Hz   Right ear:           Left ear:           Comments: Pt denies hearing difficulty  Vision Screening Comments: Annual vision screenings with Dr. Ellin Mayhew  Dietary issues and exercise activities discussed: Current Exercise Habits: Home exercise routine, Type of exercise: walking, Time (Minutes): 30, Frequency (Times/Week): 5, Weekly Exercise (Minutes/Week): 150, Intensity: Mild, Exercise limited by: None identified  Goals    . Patient Stated     Maintain health       Depression Screen PHQ 2/9 Scores 12/13/2020 08/16/2020 12/23/2019 11/26/2019 07/01/2019 11/11/2018 11/04/2018  PHQ - 2 Score 0 0 0 0 0 0 0  PHQ- 9 Score - 0 0 - - - -    Fall Risk Fall Risk  12/13/2020 08/16/2020 12/23/2019 11/26/2019 11/11/2018  Falls in the past year? 0 0 0 0 0  Comment - - - - -  Number falls in past yr: 0 - 0 0 0  Injury with Fall? 0 - 0 0 0  Risk for fall due to : No Fall Risks - No Fall Risks - -  Follow up Falls prevention discussed Falls evaluation completed Falls evaluation completed Falls prevention discussed Falls prevention discussed    FALL RISK PREVENTION PERTAINING TO THE HOME:  Any stairs in or around the home? No  If so, are there any without handrails? No  Home free  of loose throw rugs in walkways, pet beds, electrical cords, etc? Yes  Adequate lighting in your home to reduce risk of falls? Yes   ASSISTIVE  DEVICES UTILIZED TO PREVENT FALLS:  Life alert? No  Use of a cane, walker or w/c? No  Grab bars in the bathroom? Yes  Shower chair or bench in shower? No  Elevated toilet seat or a handicapped toilet? No   TIMED UP AND GO:  Was the test performed? No . Telephonic visit.   Cognitive Function: Normal cognitive status assessed by direct observation by this Nurse Health Advisor. No abnormalities found.       6CIT Screen 11/11/2018 08/23/2017 08/07/2016  What Year? 0 points 0 points 0 points  What month? 0 points 0 points 0 points  What time? 0 points 0 points 0 points  Count back from 20 0 points 0 points 0 points  Months in reverse 0 points 0 points 0 points  Repeat phrase 0 points 4 points 0 points  Total Score 0 4 0    Immunizations Immunization History  Administered Date(s) Administered  . PFIZER(Purple Top)SARS-COV-2 Vaccination 11/24/2019, 12/17/2019    TDAP status: Due, Education has been provided regarding the importance of this vaccine. Advised may receive this vaccine at local pharmacy or Health Dept. Aware to provide a copy of the vaccination record if obtained from local pharmacy or Health Dept. Verbalized acceptance and understanding.  Flu Vaccine status: Declined, Education has been provided regarding the importance of this vaccine but patient still declined. Advised may receive this vaccine at local pharmacy or Health Dept. Aware to provide a copy of the vaccination record if obtained from local pharmacy or Health Dept. Verbalized acceptance and understanding.  Pneumococcal vaccine status: Declined,  Education has been provided regarding the importance of this vaccine but patient still declined. Advised may receive this vaccine at local pharmacy or Health Dept. Aware to provide a copy of the vaccination record if obtained from  local pharmacy or Health Dept. Verbalized acceptance and understanding.   Covid-19 vaccine status: Completed vaccines  Qualifies for Shingles Vaccine? Yes   Zostavax completed No   Shingrix Completed?: No.    Education has been provided regarding the importance of this vaccine. Patient has been advised to call insurance company to determine out of pocket expense if they have not yet received this vaccine. Advised may also receive vaccine at local pharmacy or Health Dept. Verbalized acceptance and understanding.  Screening Tests Health Maintenance  Topic Date Due  . COVID-19 Vaccine (3 - Pfizer risk 4-dose series) 01/14/2020  . INFLUENZA VACCINE  09/05/2023 (Originally 04/04/2021)  . TETANUS/TDAP  09/05/2023 (Originally 11/21/1959)  . PNA vac Low Risk Adult (1 of 2 - PCV13) 09/05/2023 (Originally 11/22/2006)  . FOOT EXAM  12/22/2020  . OPHTHALMOLOGY EXAM  12/25/2020  . HEMOGLOBIN A1C  02/14/2021  . Hepatitis C Screening  Completed  . HPV VACCINES  Aged Out    Health Maintenance  Health Maintenance Due  Topic Date Due  . COVID-19 Vaccine (3 - Pfizer risk 4-dose series) 01/14/2020    Colorectal cancer screening: Type of screening: Colonoscopy. Completed 12/03/20. Repeat every 5 years  Lung Cancer Screening: (Low Dose CT Chest recommended if Age 65-80 years, 30 pack-year currently smoking OR have quit w/in 15years.) does not qualify.   Additional Screening:  Hepatitis C Screening: does qualify; Completed 08/07/16  Vision Screening: Recommended annual ophthalmology exams for early detection of glaucoma and other disorders of the eye. Is the patient up to date with their annual eye exam?  Yes  Who is the provider or what is the name of the office in which the patient attends  annual eye exams? Dr. Ellin Mayhew  Dental Screening: Recommended annual dental exams for proper oral hygiene  Community Resource Referral / Chronic Care Management: CRR required this visit?  No   CCM required this  visit?  No      Plan:     I have personally reviewed and noted the following in the patient's chart:   . Medical and social history . Use of alcohol, tobacco or illicit drugs  . Current medications and supplements . Functional ability and status . Nutritional status . Physical activity . Advanced directives . List of other physicians . Hospitalizations, surgeries, and ER visits in previous 12 months . Vitals . Screenings to include cognitive, depression, and falls . Referrals and appointments  In addition, I have reviewed and discussed with patient certain preventive protocols, quality metrics, and best practice recommendations. A written personalized care plan for preventive services as well as general preventive health recommendations were provided to patient.     Clemetine Marker, LPN   [id number]   Nurse Notes: pt c/o frequent urination but denies leakage. Pt drinks approx 4 cups of caffeinated coffee per day 7 has hx of prostate cancer. Pt advised to discuss with Dr. Army Melia at appt on Wednesday.

## 2020-12-15 ENCOUNTER — Ambulatory Visit: Payer: Medicare Other | Admitting: Internal Medicine

## 2020-12-17 DIAGNOSIS — Z20822 Contact with and (suspected) exposure to covid-19: Secondary | ICD-10-CM | POA: Diagnosis not present

## 2020-12-24 ENCOUNTER — Ambulatory Visit: Payer: Medicare Other | Admitting: Internal Medicine

## 2021-01-03 ENCOUNTER — Encounter: Payer: Medicare Other | Admitting: Internal Medicine

## 2021-01-03 NOTE — Progress Notes (Deleted)
Date:  01/03/2021   Name:  Thomas Allen   DOB:  06-Apr-1941   MRN:  [id number]   Chief Complaint: No chief complaint on file.  Diabetes He presents for his follow-up diabetic visit. He has type 2 diabetes mellitus. His disease course has been stable. Current diabetic treatment includes oral agent (monotherapy). He is compliant with treatment all of the time. An ACE inhibitor/angiotensin II receptor blocker is being taken. Eye exam is not current.  Hyperlipidemia This is a chronic problem. The problem is controlled. Current antihyperlipidemic treatment includes statins. The current treatment provides significant improvement of lipids. There are no compliance problems.   Hypertension This is a chronic problem. The problem is controlled. Past treatments include angiotensin blockers. The current treatment provides significant improvement.    Lab Results  Component Value Date   CREATININE 0.8 11/03/2020   BUN 17 11/03/2020   NA 139 11/03/2020   K 4.1 11/03/2020   CL 102 11/03/2020   CO2 29 (A) 11/03/2020   Lab Results  Component Value Date   CHOL 166 12/23/2019   HDL 44 12/23/2019   LDLCALC 101 (H) 12/23/2019   TRIG 115 12/23/2019   CHOLHDL 3.8 12/23/2019   Lab Results  Component Value Date   TSH 2.040 02/05/2017   Lab Results  Component Value Date   HGBA1C 5.8 (H) 08/16/2020   Lab Results  Component Value Date   WBC 5.5 11/03/2020   HGB 13.9 11/03/2020   HCT 50 11/03/2020   MCV 98 (H) 12/23/2019   PLT 218 11/03/2020   Lab Results  Component Value Date   ALT 17 11/03/2020   AST 16 11/03/2020   ALKPHOS 62 11/03/2020   BILITOT 0.4 12/23/2019     Review of Systems  Patient Active Problem List   Diagnosis Date Noted  . Hand pain, right 08/23/2017  . Erectile dysfunction following radiation therapy 06/26/2017  . Hyperlipidemia associated with type 2 diabetes mellitus ([place]) 03/12/2017  . Asymptomatic PVCs 02/05/2017  . Gastroesophageal reflux disease 02/05/2017   . Alcohol use disorder 01/19/2016  . Type II diabetes mellitus with complication ([place]) 73/22/0254  . Carpal tunnel syndrome 03/30/2015  . Elevated blood pressure, situational 03/30/2015  . Abnormal LFTs 03/30/2015  . H/O adenomatous polyp of colon 03/30/2015  . CA of prostate ([place]) 03/30/2015  . Tobacco use disorder, moderate, in sustained remission 03/30/2015  . Prostate cancer ([place]) 09/02/2013  . Posterior calcaneal exostosis 02/26/2013  . Calcific Achilles tendinitis 02/26/2013    Allergies  Allergen Reactions  . Ace Inhibitors Cough    Past Surgical History:  Procedure Laterality Date  . APPENDECTOMY    . COLONOSCOPY  2008   benign polyps  . COLONOSCOPY WITH PROPOFOL N/A 08/07/2017   Procedure: COLONOSCOPY WITH PROPOFOL;  Surgeon: Lollie Sails, MD;  Location: [facility] ENDOSCOPY;  Service: Endoscopy;  Laterality: N/A;  . COLONOSCOPY WITH PROPOFOL N/A 12/03/2020   Procedure: COLONOSCOPY WITH PROPOFOL;  Surgeon: Lesly Rubenstein, MD;  Location: [facility] ENDOSCOPY;  Service: Endoscopy;  Laterality: N/A;  . ESOPHAGOGASTRODUODENOSCOPY (EGD) WITH PROPOFOL N/A 08/07/2017   Procedure: ESOPHAGOGASTRODUODENOSCOPY (EGD) WITH PROPOFOL;  Surgeon: Lollie Sails, MD;  Location: [facility] ENDOSCOPY;  Service: Endoscopy;  Laterality: N/A;  . JOINT REPLACEMENT Right 2009  . JOINT REPLACEMENT Left 2015  . lithopexy    . PROSTATE BIOPSY  2012   malignant  . REPLACEMENT TOTAL KNEE Bilateral     Social History   Tobacco Use  . Smoking status: Former  Smoker    Quit date: 65    Years since quitting: 46.3  . Smokeless tobacco: Never Used  Vaping Use  . Vaping Use: Never used  Substance Use Topics  . Alcohol use: Yes    Alcohol/week: 14.0 standard drinks    Types: 14 Shots of liquor per week    Comment: 2 scotch drinks per night  . Drug use: No     Medication list has been reviewed and updated.  No outpatient medications have been marked as taking for the 01/03/21 encounter (Office  Visit) with Glean Hess, MD.    [facility] 2/9 Scores 12/13/2020 08/16/2020 12/23/2019 11/26/2019  PHQ - 2 Score 0 0 0 0  PHQ- 9 Score - 0 0 -    GAD 7 : Generalized Anxiety Score 08/16/2020 12/23/2019  Nervous, Anxious, on Edge 0 0  Control/stop worrying 0 0  Worry too much - different things 0 0  Trouble relaxing 0 0  Restless 0 0  Easily annoyed or irritable 0 0  Afraid - awful might happen 0 0  Total GAD 7 Score 0 0  Anxiety Difficulty - Not difficult at all    BP Readings from Last 3 Encounters:  12/03/20 110/63  09/10/20 132/80  08/16/20 122/72    Physical Exam  Wt Readings from Last 3 Encounters:  12/03/20 192 lb 6.3 oz (87.3 kg)  09/10/20 204 lb (92.5 kg)  08/16/20 200 lb (90.7 kg)    There were no vitals taken for this visit.  Assessment and Plan:

## 2021-01-05 ENCOUNTER — Emergency Department
Admission: EM | Admit: 2021-01-05 | Discharge: 2021-01-05 | Disposition: A | Discharge status: Home / Self Care | Payer: Medicare Other | Attending: Emergency Medicine | Admitting: Emergency Medicine

## 2021-01-05 ENCOUNTER — Other Ambulatory Visit: Payer: Self-pay

## 2021-01-05 ENCOUNTER — Ambulatory Visit: Payer: Self-pay | Admitting: *Deleted

## 2021-01-05 DIAGNOSIS — Z87891 Personal history of nicotine dependence: Secondary | ICD-10-CM | POA: Insufficient documentation

## 2021-01-05 DIAGNOSIS — Z79899 Other long term (current) drug therapy: Secondary | ICD-10-CM | POA: Insufficient documentation

## 2021-01-05 DIAGNOSIS — I10 Essential (primary) hypertension: Secondary | ICD-10-CM | POA: Diagnosis not present

## 2021-01-05 DIAGNOSIS — E860 Dehydration: Secondary | ICD-10-CM | POA: Diagnosis not present

## 2021-01-05 DIAGNOSIS — R197 Diarrhea, unspecified: Secondary | ICD-10-CM

## 2021-01-05 DIAGNOSIS — Z7984 Long term (current) use of oral hypoglycemic drugs: Secondary | ICD-10-CM | POA: Diagnosis not present

## 2021-01-05 DIAGNOSIS — Z20822 Contact with and (suspected) exposure to covid-19: Secondary | ICD-10-CM | POA: Insufficient documentation

## 2021-01-05 DIAGNOSIS — Z8546 Personal history of malignant neoplasm of prostate: Secondary | ICD-10-CM | POA: Diagnosis not present

## 2021-01-05 DIAGNOSIS — Z96653 Presence of artificial knee joint, bilateral: Secondary | ICD-10-CM | POA: Insufficient documentation

## 2021-01-05 DIAGNOSIS — E119 Type 2 diabetes mellitus without complications: Secondary | ICD-10-CM | POA: Diagnosis not present

## 2021-01-05 DIAGNOSIS — Z7982 Long term (current) use of aspirin: Secondary | ICD-10-CM | POA: Diagnosis not present

## 2021-01-05 LAB — URINALYSIS, COMPLETE (UACMP) WITH MICROSCOPIC
Bacteria, UA: NONE SEEN
Bilirubin Urine: NEGATIVE
Glucose, UA: NEGATIVE mg/dL
Hgb urine dipstick: NEGATIVE
Ketones, ur: 20 mg/dL — AB
Leukocytes,Ua: NEGATIVE
Nitrite: NEGATIVE
Protein, ur: NEGATIVE mg/dL
Specific Gravity, Urine: 1.012 (ref 1.005–1.030)
pH: 5 (ref 5.0–8.0)

## 2021-01-05 LAB — RESP PANEL BY RT-PCR (FLU A&B, COVID) ARPGX2
Influenza A by PCR: NEGATIVE
Influenza B by PCR: NEGATIVE
SARS Coronavirus 2 by RT PCR: NEGATIVE

## 2021-01-05 LAB — CBC
HCT: 39.3 % (ref 39.0–52.0)
Hemoglobin: 13.2 g/dL (ref 13.0–17.0)
MCH: 33.1 pg (ref 26.0–34.0)
MCHC: 33.6 g/dL (ref 30.0–36.0)
MCV: 98.5 fL (ref 80.0–100.0)
Platelets: 259 10*3/uL (ref 150–400)
RBC: 3.99 MIL/uL — ABNORMAL LOW (ref 4.22–5.81)
RDW: 12.2 % (ref 11.5–15.5)
WBC: 8.1 10*3/uL (ref 4.0–10.5)
nRBC: 0 % (ref 0.0–0.2)

## 2021-01-05 LAB — COMPREHENSIVE METABOLIC PANEL
ALT: 13 U/L (ref 0–44)
AST: 17 U/L (ref 15–41)
Albumin: 3.8 g/dL (ref 3.5–5.0)
Alkaline Phosphatase: 58 U/L (ref 38–126)
Anion gap: 15 (ref 5–15)
BUN: 33 mg/dL — ABNORMAL HIGH (ref 8–23)
CO2: 19 mmol/L — ABNORMAL LOW (ref 22–32)
Calcium: 9.2 mg/dL (ref 8.9–10.3)
Chloride: 99 mmol/L (ref 98–111)
Creatinine, Ser: 1.26 mg/dL — ABNORMAL HIGH (ref 0.61–1.24)
GFR, Estimated: 58 mL/min — ABNORMAL LOW (ref >60)
Glucose, Bld: 119 mg/dL — ABNORMAL HIGH (ref 70–99)
Potassium: 4.4 mmol/L (ref 3.5–5.1)
Sodium: 133 mmol/L — ABNORMAL LOW (ref 135–145)
Total Bilirubin: 1.5 mg/dL — ABNORMAL HIGH (ref 0.3–1.2)
Total Protein: 6.8 g/dL (ref 6.5–8.1)

## 2021-01-05 LAB — LIPASE, BLOOD: Lipase: 31 U/L (ref 11–51)

## 2021-01-05 MED ORDER — SODIUM CHLORIDE 0.9 % IV BOLUS
1000.0000 mL | Freq: Once | INTRAVENOUS | Status: AC
  Administered 2021-01-05: 1000 mL via INTRAVENOUS

## 2021-01-05 NOTE — ED Triage Notes (Signed)
Pt to ED POV for diarrhea since yesterday, wife reports pt needs IV fluids per doctor.  Denies vomiting or abd pain Pt ambulatory, skin color WDL Being treated for prostate cancer

## 2021-01-05 NOTE — ED Notes (Signed)
Pt reports explosive diarrhea this am.  No vomiting.  Pt has back pain.  Denies urinary sx.  Pt also had diarrhea 3 days ago.  Hx prostate cancer  Treated at [facility].  Pt alert, speech clear.  family with pt.  Iv started and fluids infusing.

## 2021-01-05 NOTE — ED Provider Notes (Signed)
[facility] Emergency Department Provider Note  Time seen: 4:25 PM  I have reviewed the triage vital signs and the nursing notes.   HISTORY  Chief Complaint Diarrhea   HPI Thomas Allen is a 80 y.o. male with a past medical history of diabetes, gastric reflux, hypertension, hyperlipidemia, presents to the emergency department for diarrhea and concerns for dehydration.  According to the patient and his wife on Monday he had 3 episodes of diarrhea, no diarrhea yesterday and then since 6:00 this morning he began experiencing "explosive diarrhea."  Wife states she gave the patient a dose of Pepto-Bismol and that stopped the diarrhea has not had any more diarrhea since this morning.  Patient spoke to his doctor and they were concerned that he was dehydrated recommended he come to the emergency department for "IV fluids."  Here the patient appears well, denies any abdominal pain.  Denies any fever.  Patient denies any chest pain shortness of breath or cough.  Largely negative review of systems besides diarrhea which stopped this morning.  Past Medical History:  Diagnosis Date  . Arthritis   . Cancer ([place])   . Diabetes mellitus without complication ([place])   . GERD (gastroesophageal reflux disease)   . Haglund's deformity   . History of kidney stones   . Hyperlipidemia   . Hypertension   . Intractable hiccups 02/05/2017  . Prostate CA ([place])   . Sleep apnea     Patient Active Problem List   Diagnosis Date Noted  . Hand pain, right 08/23/2017  . Erectile dysfunction following radiation therapy 06/26/2017  . Hyperlipidemia associated with type 2 diabetes mellitus ([place]) 03/12/2017  . Asymptomatic PVCs 02/05/2017  . Gastroesophageal reflux disease 02/05/2017  . Alcohol use disorder 01/19/2016  . Type II diabetes mellitus with complication ([place]) 29/51/8841  . Carpal tunnel syndrome 03/30/2015  . Elevated blood pressure, situational 03/30/2015  . Abnormal LFTs 03/30/2015   . H/O adenomatous polyp of colon 03/30/2015  . CA of prostate ([place]) 03/30/2015  . Tobacco use disorder, moderate, in sustained remission 03/30/2015  . Prostate cancer ([place]) 09/02/2013  . Posterior calcaneal exostosis 02/26/2013  . Calcific Achilles tendinitis 02/26/2013    Past Surgical History:  Procedure Laterality Date  . APPENDECTOMY    . COLONOSCOPY  2008   benign polyps  . COLONOSCOPY WITH PROPOFOL N/A 08/07/2017   Procedure: COLONOSCOPY WITH PROPOFOL;  Surgeon: Lollie Sails, MD;  Location: [facility] ENDOSCOPY;  Service: Endoscopy;  Laterality: N/A;  . COLONOSCOPY WITH PROPOFOL N/A 12/03/2020   Procedure: COLONOSCOPY WITH PROPOFOL;  Surgeon: Lesly Rubenstein, MD;  Location: [facility] ENDOSCOPY;  Service: Endoscopy;  Laterality: N/A;  . ESOPHAGOGASTRODUODENOSCOPY (EGD) WITH PROPOFOL N/A 08/07/2017   Procedure: ESOPHAGOGASTRODUODENOSCOPY (EGD) WITH PROPOFOL;  Surgeon: Lollie Sails, MD;  Location: [facility] ENDOSCOPY;  Service: Endoscopy;  Laterality: N/A;  . JOINT REPLACEMENT Right 2009  . JOINT REPLACEMENT Left 2015  . lithopexy    . PROSTATE BIOPSY  2012   malignant  . REPLACEMENT TOTAL KNEE Bilateral     Prior to Admission medications   Medication Sig Start Date End Date Taking? Authorizing Provider  aspirin 325 MG tablet Take by mouth. Pt does not take daily    [provider]  enzalutamide Gillermina Phy) 40 MG capsule Take by mouth. 08/04/20 08/04/21  [provider]  Leuprolide Acetate, 6 Month, (LUPRON DEPOT, 69-MONTH,) 45 MG injection Inject 45 mg into the muscle every 3 (three) months.    [provider]  losartan (COZAAR) 25 MG tablet Take 1 tablet (25 mg total) by mouth daily. 12/23/19   Glean Hess, MD  metFORMIN (GLUCOPHAGE) 1000 MG tablet TAKE 1 TABLET BY MOUTH TWICE A DAY 10/20/20   Glean Hess, MD  Multiple Vitamins-Minerals (MULTIVITAMIN WITH MINERALS) tablet Take 1 tablet by mouth daily.    [provider]  omeprazole  (PRILOSEC) 20 MG capsule TAKE 1 CAPSULE (20 MG TOTAL) BY MOUTH 2 (TWO) TIMES DAILY BEFORE A MEAL. Patient taking differently: Take 20 mg by mouth daily. 07/08/20   Glean Hess, MD  pravastatin (PRAVACHOL) 20 MG tablet Take 1 tablet (20 mg total) by mouth daily. 04/27/20   Glean Hess, MD    Allergies  Allergen Reactions  . Ace Inhibitors Cough    Family History  Problem Relation Age of Onset  . Diabetes Mother   . Colon cancer Mother   . Colon cancer Father   . Prostate cancer Neg Hx   . Bladder Cancer Neg Hx   . Kidney cancer Neg Hx     Social History Social History   Tobacco Use  . Smoking status: Former Smoker    Quit date: 1976    Years since quitting: 46.3  . Smokeless tobacco: Never Used  Vaping Use  . Vaping Use: Never used  Substance Use Topics  . Alcohol use: Yes    Alcohol/week: 14.0 standard drinks    Types: 14 Shots of liquor per week    Comment: 2 scotch drinks per night  . Drug use: No    Review of Systems Constitutional: Negative for fever. Cardiovascular: Negative for chest pain. Respiratory: Negative for shortness of breath. Gastrointestinal: Negative for abdominal pain.  Positive for diarrhea. Genitourinary: Negative for urinary compaints Musculoskeletal: Negative for musculoskeletal complaints Neurological: Negative for headache All other ROS negative  ____________________________________________   PHYSICAL EXAM:  VITAL SIGNS: ED Triage Vitals  Enc Vitals Group     BP 01/05/21 1504 (!) 123/53     Pulse Rate 01/05/21 1503 93     Resp 01/05/21 1503 18     Temp 01/05/21 1503 98.4 F (36.9 C)     Temp Source 01/05/21 1503 Oral     SpO2 01/05/21 1503 97 %     Weight 01/05/21 1503 185 lb (83.9 kg)     Height 01/05/21 1503 5\' 8"  (1.727 m)     Head Circumference --      Peak Flow --      Pain Score 01/05/21 1503 0     Pain Loc --      Pain Edu? --      Excl. in [place]? --     Constitutional: Alert and oriented. Well appearing  and in no distress. Eyes: Normal exam ENT      Head: Normocephalic and atraumatic.      Mouth/Throat: Mucous membranes are moist. Cardiovascular: Normal rate, regular rhythm.  Respiratory: Normal respiratory effort without tachypnea nor retractions. Breath sounds are clear Gastrointestinal: Soft and nontender. No distention.  Benign abdomen. Musculoskeletal: Nontender with normal range of motion in all extremities.  Neurologic:  Normal speech and language. No gross focal neurologic deficits Skin:  Skin is warm, dry and intact.  Psychiatric: Mood and affect are normal.   ____________________________________________   INITIAL IMPRESSION / ASSESSMENT AND PLAN / ED COURSE  Pertinent labs & imaging results that were available during my care of the patient were reviewed by me and considered in my medical decision making (see  chart for details).   Patient presents to the emergency department for diarrhea and concerns for dehydration.  Patient is not had any further diarrhea since this morning.  No fever.  No abdominal pain.  Benign abdominal exam.  We will check labs, IV hydrate and continue to closely monitor.  Patient and wife agreeable to plan of care.  Overall the patient appears well, reassuring physical exam as well as vitals.  Patient's work-up is reassuring.  Patient has received IV fluids and is feeling better.  We will discharge patient home with PCP follow-up.  Patient and family agreeable to plan.  HENDRIX YURKOVICH was evaluated in Emergency Department on 01/05/2021 for the symptoms described in the history of present illness. He was evaluated in the context of the global COVID-19 pandemic, which necessitated consideration that the patient might be at risk for infection with the SARS-CoV-2 virus that causes COVID-19. Institutional protocols and algorithms that pertain to the evaluation of patients at risk for COVID-19 are in a state of rapid change based on information released by regulatory  bodies including the CDC and federal and state organizations. These policies and algorithms were followed during the patient's care in the ED.  ____________________________________________   FINAL CLINICAL IMPRESSION(S) / ED DIAGNOSES  Diarrhea Dehydration   Harvest Dark, MD 01/05/21 1850

## 2021-01-05 NOTE — ED Notes (Signed)
Sig pad not working, pt verbalizes agreement and understanding of MSE waiver

## 2021-01-05 NOTE — Telephone Encounter (Signed)
Patient informed per Dr. Army Allen that he NEEDS to go to the ER for at least fluids to try and feel better. I explained the weakness could be dehydration from diarrhea and he should not wait to be treated for this.  His wife Thomas Allen got on the phone and she said she will get him there one way or another. She said he has drunk 2 water bottles and is now holding that down, but explained he still could remain dehydrated and might feel much better after fluids. She said she does not want to call 911 to get him to the hospital but she will if she has to.   She thanked me and told her to call if they need anything further.

## 2021-01-05 NOTE — Telephone Encounter (Signed)
[name] calling in c/o that he is having terrible diarrhea.   He is in the background answering my questions.  This morning at 6:30 he had explosive diarrhea in the bed and all the way to the bathroom.   He had diarrhea on Monday also.   At 7:00 this morning Betsy gave him some Pepto Bismol and the diarrhea stopped.   He has been sitting in his chair since.   He has not tried to eat or drink anything because he is afraid it might make him sick again.   He is very weak and pale per [facility].  He is being treated for prostate cancer.   He is supposed to take a pill, enzalutamide Gillermina Phy)  but he has to take it with food and water or else it will make him very sick.   He has not taken that today.  He refuses to go to the ED.   "I would have to fight him tooth and nail to get him to go there".   "He is very weak".   He denies being dizzy but he has not moved around since this morning.  Thomas Allen is going to call his cancer dr as soon as she gets off the phone with me for guidance regarding his medication for the prostate cancer.    I encouraged him to drink water and try to eat something light like soup or toast to start with.  Thomas Allen said she would fix him some soup to start with.  I encouraged him to go on to the ED for IV fluids.  The fact that he is having diarrhea and is weak and pale plus being treated for cancer he really needed the IV fluids and probably some lab work to be sure his electrolytes are ok.  Thomas Allen said she would call his cancer dr now and try to get him to at least eat some soup.   "If I need to I will take him to the ED".    I let her know I would let Dr. Army Melia know what is going on also.   The office is closed for lunch now but I would send a note so they would have it when they return. Thomas Allen was agreeable to this plan.  I sent my notes to [facility] high priority for Dr. Army Melia.   Reason for Disposition . [1] SEVERE diarrhea (e.g., 7 or more times / day more  than normal) AND [2] age > 60 years  Answer Assessment - Initial Assessment Questions 1. DIARRHEA SEVERITY: "How bad is the diarrhea?" "How many extra stools have you had in the past 24 hours than normal?"    - NO DIARRHEA (SCALE 0)   - MILD (SCALE 1-3): Few loose or mushy BMs; increase of 1-3 stools over normal daily number of stools; mild increase in ostomy output.   -  MODERATE (SCALE 4-7): Increase of 4-6 stools daily over normal; moderate increase in ostomy output. * SEVERE (SCALE 8-10; OR 'WORST POSSIBLE'): Increase of 7 or more stools daily over normal; moderate increase in ostomy output; incontinence.     Betsy wife calling in.   6:30 this morning he is having explosive diarrhea from the bed to the bathroom.   I gave him Pepto Bismol at 7:00.   He has prostate cancer.  He is supposed to take a pill a [place].   He has to eat with those pills.   He can't eat or drink anything.  The Pepto Bismol stopped the diarrhea.   He is pale and weak.   He is not moving around.   He won't go to the ED.    He had diarrhea on Monday 3 times.   It wasn't explosive on Monday.    2. ONSET: "When did the diarrhea begin?"      Monday 3. BM CONSISTENCY: "How loose or watery is the diarrhea?"      Watery  4. VOMITING: "Are you also vomiting?" If Yes, ask: "How many times in the past 24 hours?"      No vomiting 5. ABDOMINAL PAIN: "Are you having any abdominal pain?" If Yes, ask: "What does it feel like?" (e.g., crampy, dull, intermittent, constant)      No cramping 6. ABDOMINAL PAIN SEVERITY: If present, ask: "How bad is the pain?"  (e.g., Scale 1-10; mild, moderate, or severe)   - MILD (1-3): doesn't interfere with normal activities, abdomen soft and not tender to touch    - MODERATE (4-7): interferes with normal activities or awakens from sleep, tender to touch    - SEVERE (8-10): excruciating pain, doubled over, unable to do any normal activities       None 7. ORAL INTAKE: If vomiting, "Have you been  able to drink liquids?" "How much fluids have you had in the past 24 hours?"     He did not want to eat last night.   He needs to eat due to the prostate cancer and the medication.   He drank 16 oz of water before going to bed. 8. HYDRATION: "Any signs of dehydration?" (e.g., dry mouth [not just dry lips], too weak to stand, dizziness, new weight loss) "When did you last urinate?"     Very weak and pale.   No dizziness.  9:00 last time able to urinate. 9. EXPOSURE: "Have you traveled to a foreign country recently?" "Have you been exposed to anyone with diarrhea?" "Could you have eaten any food that was spoiled?"     No   Wife had the flu 2 weeks ago.  covid test negative.   He had the flu 4 weeks ago.   He wasn't very sick, but wife was. 10. ANTIBIOTIC USE: "Are you taking antibiotics now or have you taken antibiotics in the past 2 months?"       No   11. OTHER SYMPTOMS: "Do you have any other symptoms?" (e.g., fever, blood in stool)       The prostate medicine makes him sick if he doesn't eat eat or drink.   12. PREGNANCY: "Is there any chance you are pregnant?" "When was your last menstrual period?"       N/A  Protocols used: DIARRHEA-A-AH

## 2021-01-05 NOTE — ED Notes (Signed)
Pt alert.  States feeling better,  Family with pt  Iv fluids infusing.

## 2021-01-19 DIAGNOSIS — R5383 Other fatigue: Secondary | ICD-10-CM | POA: Diagnosis not present

## 2021-01-19 DIAGNOSIS — Z87891 Personal history of nicotine dependence: Secondary | ICD-10-CM | POA: Diagnosis not present

## 2021-01-19 DIAGNOSIS — Z79818 Long term (current) use of other agents affecting estrogen receptors and estrogen levels: Secondary | ICD-10-CM | POA: Diagnosis not present

## 2021-01-19 DIAGNOSIS — Z79899 Other long term (current) drug therapy: Secondary | ICD-10-CM | POA: Diagnosis not present

## 2021-01-19 DIAGNOSIS — C772 Secondary and unspecified malignant neoplasm of intra-abdominal lymph nodes: Secondary | ICD-10-CM | POA: Diagnosis not present

## 2021-01-19 DIAGNOSIS — C61 Malignant neoplasm of prostate: Secondary | ICD-10-CM | POA: Diagnosis not present

## 2021-01-19 DIAGNOSIS — Z923 Personal history of irradiation: Secondary | ICD-10-CM | POA: Diagnosis not present

## 2021-01-19 LAB — COMPREHENSIVE METABOLIC PANEL: Calcium: 10.3 (ref 8.7–10.7)

## 2021-01-19 LAB — CBC AND DIFFERENTIAL
Hemoglobin: 14.8 (ref 13.5–17.5)
Platelets: 257 (ref 150–399)
WBC: 8.1

## 2021-01-19 LAB — BASIC METABOLIC PANEL
BUN: 18 (ref 4–21)
Creatinine: 0.9 (ref 0.6–1.3)
Glucose: 129

## 2021-01-19 LAB — HEMOGLOBIN A1C: Hemoglobin A1C: 5.9

## 2021-01-19 LAB — TESTOSTERONE: Testosterone: 25

## 2021-01-28 ENCOUNTER — Ambulatory Visit (INDEPENDENT_AMBULATORY_CARE_PROVIDER_SITE_OTHER): Payer: Medicare Other | Admitting: Internal Medicine

## 2021-01-28 ENCOUNTER — Other Ambulatory Visit: Payer: Self-pay

## 2021-01-28 ENCOUNTER — Encounter: Payer: Self-pay | Admitting: Internal Medicine

## 2021-01-28 VITALS — BP 92/62 | HR 100 | Temp 98.0°F | Ht 68.0 in | Wt 185.0 lb

## 2021-01-28 DIAGNOSIS — R630 Anorexia: Secondary | ICD-10-CM | POA: Diagnosis not present

## 2021-01-28 DIAGNOSIS — E118 Type 2 diabetes mellitus with unspecified complications: Secondary | ICD-10-CM | POA: Diagnosis not present

## 2021-01-28 DIAGNOSIS — R350 Frequency of micturition: Secondary | ICD-10-CM

## 2021-01-28 DIAGNOSIS — C61 Malignant neoplasm of prostate: Secondary | ICD-10-CM

## 2021-01-28 DIAGNOSIS — S39012A Strain of muscle, fascia and tendon of lower back, initial encounter: Secondary | ICD-10-CM | POA: Diagnosis not present

## 2021-01-28 LAB — POCT URINALYSIS DIPSTICK
Bilirubin, UA: NEGATIVE
Blood, UA: NEGATIVE
Glucose, UA: NEGATIVE
Leukocytes, UA: NEGATIVE
Nitrite, UA: NEGATIVE
Protein, UA: NEGATIVE
Spec Grav, UA: 1.02 (ref 1.010–1.025)
Urobilinogen, UA: 0.2 E.U./dL
pH, UA: 5 (ref 5.0–8.0)

## 2021-01-28 MED ORDER — TOLTERODINE TARTRATE ER 2 MG PO CP24: 2.0000 mg | ORAL_CAPSULE | Freq: Every day | ORAL | 1 refills | Status: DC

## 2021-01-28 MED ORDER — BACLOFEN 10 MG PO TABS: 10.0000 mg | ORAL_TABLET | Freq: Three times a day (TID) | ORAL | 1 refills | Status: DC | PRN

## 2021-01-28 NOTE — Progress Notes (Signed)
Date:  01/28/2021   Name:  Thomas Allen   DOB:  [birth year]   MRN:  [id number]   Chief Complaint: Urinary Frequency (Started about 6 weeks ago. No pain or blood present per patient. ) and Fatigue (Fatigue with lack of appetite. Last week had diarrhea every other day. Bowels are starting to come back normal. )  Urinary Frequency  This is a new problem. The current episode started more than 1 month ago. The problem occurs every urination. The problem has been unchanged. The patient is experiencing no pain. There has been no fever. Associated symptoms include frequency, hesitancy and urgency. Pertinent negatives include no chills, discharge, hematuria, nausea or vomiting. He has tried nothing for the symptoms.  Back Pain This is a new problem. The current episode started 1 to 4 weeks ago. The problem occurs daily. The problem is unchanged. The pain is present in the gluteal. The quality of the pain is described as aching. The pain is worse during the day. The symptoms are aggravated by standing and twisting. Pertinent negatives include no abdominal pain, chest pain, fever or headaches. Risk factors include history of cancer. Treatments tried: otc pain patches.    Lab Results  Component Value Date   CREATININE 0.9 01/19/2021   BUN 18 01/19/2021   NA 133 (L) 01/05/2021   K 4.4 01/05/2021   CL 99 01/05/2021   CO2 19 (L) 01/05/2021   Lab Results  Component Value Date   CHOL 166 12/23/2019   HDL 44 12/23/2019   LDLCALC 101 (H) 12/23/2019   TRIG 115 12/23/2019   CHOLHDL 3.8 12/23/2019   Lab Results  Component Value Date   TSH 2.040 02/05/2017   Lab Results  Component Value Date   HGBA1C 5.9 01/19/2021   Lab Results  Component Value Date   WBC 8.1 01/19/2021   HGB 14.8 01/19/2021   HCT 39.3 01/05/2021   MCV 98.5 01/05/2021   PLT 257 01/19/2021   Lab Results  Component Value Date   ALT 13 01/05/2021   AST 17 01/05/2021   ALKPHOS 58 01/05/2021   BILITOT 1.5 (H) 01/05/2021      Review of Systems  Constitutional: Positive for unexpected weight change. Negative for chills, fatigue and fever.  HENT: Negative for trouble swallowing.   Respiratory: Negative for chest tightness and shortness of breath.   Cardiovascular: Negative for chest pain and leg swelling.  Gastrointestinal: Positive for diarrhea. Negative for abdominal pain, blood in stool, nausea and vomiting.  Genitourinary: Positive for frequency, hesitancy and urgency. Negative for hematuria.  Musculoskeletal: Positive for back pain.  Neurological: Negative for dizziness, light-headedness and headaches.    Patient Active Problem List   Diagnosis Date Noted  . Low back strain, initial encounter 01/28/2021  . Hand pain, right 08/23/2017  . Erectile dysfunction following radiation therapy 06/26/2017  . Urinary frequency 06/18/2017  . Hyperlipidemia associated with type 2 diabetes mellitus ([place]) 03/12/2017  . Asymptomatic PVCs 02/05/2017  . Gastroesophageal reflux disease 02/05/2017  . Alcohol use disorder 01/19/2016  . Type II diabetes mellitus with complication ([place]) 37/62/8315  . Carpal tunnel syndrome 03/30/2015  . Elevated blood pressure, situational 03/30/2015  . Abnormal LFTs 03/30/2015  . H/O adenomatous polyp of colon 03/30/2015  . CA of prostate ([place]) 03/30/2015  . Tobacco use disorder, moderate, in sustained remission 03/30/2015  . Prostate cancer ([place]) 09/02/2013  . Posterior calcaneal exostosis 02/26/2013  . Calcific Achilles tendinitis 02/26/2013    Allergies  Allergen Reactions  .  Ace Inhibitors Cough    Past Surgical History:  Procedure Laterality Date  . APPENDECTOMY    . COLONOSCOPY  2008   benign polyps  . COLONOSCOPY WITH PROPOFOL N/A 08/07/2017   Procedure: COLONOSCOPY WITH PROPOFOL;  Surgeon: Lollie Sails, MD;  Location[facility] ENDOSCOPY;  Service: Endoscopy;  Laterality: N/A;  . COLONOSCOPY WITH PROPOFOL N/A 12/03/2020   Procedure: COLONOSCOPY WITH PROPOFOL;   Surgeon: Lesly Rubenstein, MD;  Location: [facility] ENDOSCOPY;  Service: Endoscopy;  Laterality: N/A;  . ESOPHAGOGASTRODUODENOSCOPY (EGD) WITH PROPOFOL N/A 08/07/2017   Procedure: ESOPHAGOGASTRODUODENOSCOPY (EGD) WITH PROPOFOL;  Surgeon: Lollie Sails, MD;  Location: [facility] ENDOSCOPY;  Service: Endoscopy;  Laterality: N/A;  . JOINT REPLACEMENT Right 2009  . JOINT REPLACEMENT Left 2015  . lithopexy    . PROSTATE BIOPSY  2012   malignant  . REPLACEMENT TOTAL KNEE Bilateral     Social History   Tobacco Use  . Smoking status: Former Smoker    Quit date: 1976    Years since quitting: 46.4  . Smokeless tobacco: Never Used  Vaping Use  . Vaping Use: Never used  Substance Use Topics  . Alcohol use: Yes    Alcohol/week: 14.0 standard drinks    Types: 14 Shots of liquor per week    Comment: 2 scotch drinks per night  . Drug use: No     Medication list has been reviewed and updated.  Current Meds  Medication Sig  . aspirin 325 MG tablet Take by mouth. Pt does not take daily  . baclofen (LIORESAL) 10 MG tablet Take 1 tablet (10 mg total) by mouth 3 (three) times daily as needed for muscle spasms.  . enzalutamide (XTANDI) 40 MG capsule Take by mouth.  . Leuprolide Acetate, 6 Month, (LUPRON DEPOT, 61-MONTH,) 45 MG injection Inject 45 mg into the muscle every 3 (three) months.  Marland Kitchen losartan (COZAAR) 25 MG tablet Take 1 tablet (25 mg total) by mouth daily.  . metFORMIN (GLUCOPHAGE) 1000 MG tablet TAKE 1 TABLET BY MOUTH TWICE A DAY  . Multiple Vitamins-Minerals (MULTIVITAMIN WITH MINERALS) tablet Take 1 tablet by mouth daily.  Marland Kitchen omeprazole (PRILOSEC) 20 MG capsule TAKE 1 CAPSULE (20 MG TOTAL) BY MOUTH 2 (TWO) TIMES DAILY BEFORE A MEAL. (Patient taking differently: Take 20 mg by mouth daily.)  . pravastatin (PRAVACHOL) 20 MG tablet Take 1 tablet (20 mg total) by mouth daily.  Marland Kitchen tolterodine (DETROL LA) 2 MG 24 hr capsule Take 1 capsule (2 mg total) by mouth daily.    PHQ 2/9 Scores 01/28/2021  12/13/2020 08/16/2020 12/23/2019  PHQ - 2 Score 0 0 0 0  PHQ- 9 Score 2 - 0 0    GAD 7 : Generalized Anxiety Score 01/28/2021 08/16/2020 12/23/2019  Nervous, Anxious, on Edge 0 0 0  Control/stop worrying 0 0 0  Worry too much - different things 0 0 0  Trouble relaxing 0 0 0  Restless 0 0 0  Easily annoyed or irritable 0 0 0  Afraid - awful might happen 0 0 0  Total GAD 7 Score 0 0 0  Anxiety Difficulty Not difficult at all - Not difficult at all    BP Readings from Last 3 Encounters:  01/28/21 92/62  01/05/21 (!) 145/50  12/03/20 110/63    Physical Exam Vitals and nursing note reviewed.  Constitutional:      General: He is not in acute distress.    Appearance: Normal appearance. He is well-developed.  HENT:  Head: Normocephalic and atraumatic.  Cardiovascular:     Rate and Rhythm: Normal rate and regular rhythm.     Pulses: Normal pulses.     Heart sounds: No murmur heard.   Pulmonary:     Effort: Pulmonary effort is normal. No respiratory distress.     Breath sounds: No wheezing or rhonchi.  Musculoskeletal:        General: Normal range of motion.     Cervical back: Normal range of motion.     Lumbar back: No tenderness or bony tenderness. Negative right straight leg raise test and negative left straight leg raise test.     Right hip: Normal.     Left hip: Normal.     Comments: Discomfort to palpation over both SI regions  Lymphadenopathy:     Cervical: No cervical adenopathy.  Skin:    General: Skin is warm and dry.     Findings: No rash.  Neurological:     General: No focal deficit present.     Mental Status: He is alert and oriented to person, place, and time.  Psychiatric:        Mood and Affect: Mood normal.        Behavior: Behavior normal.     Wt Readings from Last 3 Encounters:  01/28/21 185 lb (83.9 kg)  01/05/21 185 lb (83.9 kg)  12/03/20 192 lb 6.3 oz (87.3 kg)    BP 92/62   Pulse 100   Temp 98 F (36.7 C) (Oral)   Ht 5\' 8"  (1.727 m)    Wt 185 lb (83.9 kg)   SpO2 98%   BMI 28.13 kg/m   Assessment and Plan: 1. Urinary frequency Begin medication - this will hopefully allow for more restful sleep No evidence of bacterial infection - POCT Urinalysis Dipstick - tolterodine (DETROL LA) 2 MG 24 hr capsule; Take 1 capsule (2 mg total) by mouth daily.  Dispense: 30 capsule; Refill: 1  2. Decreased appetite Likely due to fatigue, hormonal therapy for prostate cancer  3. Low back strain, initial encounter Suspect low back muscular strain Metastatic bone lesions less likely given the bilateral nature of the sx. Continue pain patches If no improvement, will recommend imaging - baclofen (LIORESAL) 10 MG tablet; Take 1 tablet (10 mg total) by mouth 3 (three) times daily as needed for muscle spasms.  Dispense: 30 each; Refill: 1  4. Type II diabetes mellitus with complication (HCC) Clinically stable by exam and report without s/s of hypoglycemia. Tolerating medications - metformin -  well without side effects or other concerns. Recent A1C is normal.  5. Prostate cancer ([place]) Currently being treated with Enzalutamide which has numerous side effects Dose recently decreased with some improvement in diarrhea   Partially dictated using Editor, commissioning. Any errors are unintentional.  Halina Maidens, MD [facility]  01/28/2021

## 2021-02-28 ENCOUNTER — Encounter: Payer: Self-pay | Admitting: Emergency Medicine

## 2021-02-28 ENCOUNTER — Ambulatory Visit: Payer: Self-pay | Admitting: *Deleted

## 2021-02-28 ENCOUNTER — Emergency Department
Admission: EM | Admit: 2021-02-28 | Discharge: 2021-02-28 | Disposition: A | Discharge status: Home / Self Care | Payer: Medicare Other | Attending: Emergency Medicine | Admitting: Emergency Medicine

## 2021-02-28 ENCOUNTER — Other Ambulatory Visit: Payer: Self-pay

## 2021-02-28 ENCOUNTER — Emergency Department: Payer: Medicare Other

## 2021-02-28 DIAGNOSIS — Z96653 Presence of artificial knee joint, bilateral: Secondary | ICD-10-CM | POA: Insufficient documentation

## 2021-02-28 DIAGNOSIS — G319 Degenerative disease of nervous system, unspecified: Secondary | ICD-10-CM | POA: Diagnosis not present

## 2021-02-28 DIAGNOSIS — W11XXXA Fall on and from ladder, initial encounter: Secondary | ICD-10-CM | POA: Insufficient documentation

## 2021-02-28 DIAGNOSIS — S161XXA Strain of muscle, fascia and tendon at neck level, initial encounter: Secondary | ICD-10-CM | POA: Diagnosis not present

## 2021-02-28 DIAGNOSIS — I10 Essential (primary) hypertension: Secondary | ICD-10-CM | POA: Diagnosis not present

## 2021-02-28 DIAGNOSIS — M40202 Unspecified kyphosis, cervical region: Secondary | ICD-10-CM | POA: Diagnosis not present

## 2021-02-28 DIAGNOSIS — S0990XA Unspecified injury of head, initial encounter: Secondary | ICD-10-CM

## 2021-02-28 DIAGNOSIS — Z7982 Long term (current) use of aspirin: Secondary | ICD-10-CM | POA: Insufficient documentation

## 2021-02-28 DIAGNOSIS — Z8546 Personal history of malignant neoplasm of prostate: Secondary | ICD-10-CM | POA: Diagnosis not present

## 2021-02-28 DIAGNOSIS — S199XXA Unspecified injury of neck, initial encounter: Secondary | ICD-10-CM | POA: Diagnosis not present

## 2021-02-28 DIAGNOSIS — W19XXXA Unspecified fall, initial encounter: Secondary | ICD-10-CM

## 2021-02-28 DIAGNOSIS — Z79899 Other long term (current) drug therapy: Secondary | ICD-10-CM | POA: Diagnosis not present

## 2021-02-28 DIAGNOSIS — E119 Type 2 diabetes mellitus without complications: Secondary | ICD-10-CM | POA: Diagnosis not present

## 2021-02-28 DIAGNOSIS — M47812 Spondylosis without myelopathy or radiculopathy, cervical region: Secondary | ICD-10-CM | POA: Diagnosis not present

## 2021-02-28 DIAGNOSIS — I6782 Cerebral ischemia: Secondary | ICD-10-CM | POA: Diagnosis not present

## 2021-02-28 DIAGNOSIS — Z87891 Personal history of nicotine dependence: Secondary | ICD-10-CM | POA: Diagnosis not present

## 2021-02-28 DIAGNOSIS — Z7984 Long term (current) use of oral hypoglycemic drugs: Secondary | ICD-10-CM | POA: Insufficient documentation

## 2021-02-28 DIAGNOSIS — Z981 Arthrodesis status: Secondary | ICD-10-CM | POA: Diagnosis not present

## 2021-02-28 DIAGNOSIS — S1093XA Contusion of unspecified part of neck, initial encounter: Secondary | ICD-10-CM | POA: Diagnosis not present

## 2021-02-28 DIAGNOSIS — M542 Cervicalgia: Secondary | ICD-10-CM | POA: Diagnosis not present

## 2021-02-28 MED ORDER — PREDNISONE 20 MG PO TABS
60.0000 mg | ORAL_TABLET | Freq: Once | ORAL | Status: AC
  Administered 2021-02-28: 60 mg via ORAL
  Filled 2021-02-28: qty 3

## 2021-02-28 MED ORDER — TIZANIDINE HCL 4 MG PO TABS: 4.0000 mg | ORAL_TABLET | Freq: Four times a day (QID) | ORAL | 0 refills | Status: DC | PRN

## 2021-02-28 MED ORDER — PREDNISONE 50 MG PO TABS: 50.0000 mg | ORAL_TABLET | Freq: Every day | ORAL | 0 refills | Status: DC

## 2021-02-28 NOTE — Telephone Encounter (Signed)
Spoke with the wife for Thomas Allen and she said that he would need a referral to someone and I said to her that he would need to be seen in urgent care for treatment because they may need to do a x-ray of his neck and that dr berglund does not have the equipment here in this office to do so.

## 2021-02-28 NOTE — ED Triage Notes (Signed)
C/O fall from 6' ladder on Thursday.  Head hit couch, neck hit tile floor.  C/O  neck pain.  AAOx3.  Skin warm and dry. NAD

## 2021-02-28 NOTE — Telephone Encounter (Signed)
Patient's wife is calling to report patient fell off ladder on Friday- 6 ft and hurt neck. She reports no loss of consciousness or other injury- but patient has swelling in back of neck- lemon size. Patient is walking bent over and he is in pain. Call to office for appointment- they request note for PCP review. Please contact at [phone]

## 2021-02-28 NOTE — ED Notes (Signed)
See triage note  Presents s/p fall last Thursday  Fell from ladder approx 6 ft  Landed on tile floor  conts' to have h/a and some swelling noted to back of head

## 2021-02-28 NOTE — Telephone Encounter (Signed)
Please call pt tell him that he should go to [facility]. They can do things there that we cant do in office here.  KP

## 2021-02-28 NOTE — Telephone Encounter (Signed)
Reason for Disposition  SEVERE neck pain (e.g., excruciating)  Answer Assessment - Initial Assessment Questions 1. MECHANISM: "How did the injury happen?" (e.g., fall, MVA, twisting injury; consider the possibility of domestic violence or elder abuse)     [place] off ladder 2. ONSET: "When did the injury happen?" (e.g., minutes, hours, days)     Friday 3. LOCATION: "What part of the neck is injured?" "Where does it hurt?"     Top of spine- top vertebrae 4. PAIN SEVERITY: "How bad is the pain?" "Can you move the neck normally?" (Scale 1-10; or mild, moderate, severe)   - NO PAIN (0): no pain, or only slight stiffness    - MILD (1-3): doesn't interfere with normal activities    - MODERATE (4-7): interferes with normal activities or awakens from sleep    - SEVERE (8-10):  excruciating pain, unable to do any normal activities       severe 5. CORD SYMPTOMS: "Any weakness or numbness of the arms or legs?"     no 6. SIZE: For cuts, bruises, or swelling, ask: "How large is it?" (e.g., inches or centimeters)      Swelling top of vertebra, lemon size 7. TETANUS: For any breaks in the skin, ask: "When was the last tetanus booster?"     no 8. OTHER SYMPTOMS: "Do you have any other symptoms?" (e.g., headache)     Feels patient is not walking- bent forward 9. PREGNANCY: "Is there any chance you are pregnant?" "When was your last menstrual period?"     N/a  Protocols used: Neck Injury-A-AH

## 2021-02-28 NOTE — ED Provider Notes (Signed)
[facility] Emergency Department Provider Note  ____________________________________________  Time seen: Approximately 4:23 PM  I have reviewed the triage vital signs and the nursing notes.   HISTORY  Chief Complaint Fall    HPI Thomas Allen is a 80 y.o. male who presents to the emergency department for evaluation of headache and neck pain following an injury.  Patient was on a 3 foot stepstool when he fell off striking his head and his neck.  He had a headache initially which has improved and resolved at this time but continues to have neck pain primarily over the C6/C7 region.  No radiation of pain into the arms or hands.  Patient has had no numbness or tingling.  No loss of sensation or function.  There is no bowel or bladder dysfunction, saddle anesthesia or paresthesias.  Patient denies any headache currently.  No visual changes.  Patient is primarily concerned for ongoing neck pain after this injury.  Injury occurred 4 days ago.       Past Medical History:  Diagnosis Date   Arthritis    Cancer ([place])    Diabetes mellitus without complication ([place])    GERD (gastroesophageal reflux disease)    Haglund's deformity    History of kidney stones    Hyperlipidemia    Hypertension    Intractable hiccups 02/05/2017   Prostate CA [facility])    Sleep apnea     Patient Active Problem List   Diagnosis Date Noted   Low back strain, initial encounter 01/28/2021   Hand pain, right 08/23/2017   Erectile dysfunction following radiation therapy 06/26/2017   Urinary frequency 06/18/2017   Hyperlipidemia associated with type 2 diabetes mellitus ([place]) 03/12/2017   Asymptomatic PVCs 02/05/2017   Gastroesophageal reflux disease 02/05/2017   Alcohol use disorder 01/19/2016   Type II diabetes mellitus with complication ([place]) 24/26/8341   Carpal tunnel syndrome 03/30/2015   Elevated blood pressure, situational 03/30/2015   Abnormal LFTs 03/30/2015   H/O adenomatous polyp of  colon 03/30/2015   CA of prostate ([place]) 03/30/2015   Tobacco use disorder, moderate, in sustained remission 03/30/2015   Prostate cancer ([place]) 09/02/2013   Posterior calcaneal exostosis 02/26/2013   Calcific Achilles tendinitis 02/26/2013    Past Surgical History:  Procedure Laterality Date   APPENDECTOMY     COLONOSCOPY  2008   benign polyps   COLONOSCOPY WITH PROPOFOL N/A 08/07/2017   Procedure: COLONOSCOPY WITH PROPOFOL;  Surgeon: Lollie Sails, MD;  Location: [facility] ENDOSCOPY;  Service: Endoscopy;  Laterality: N/A;   COLONOSCOPY WITH PROPOFOL N/A 12/03/2020   Procedure: COLONOSCOPY WITH PROPOFOL;  Surgeon: Lesly Rubenstein, MD;  Location: [facility] ENDOSCOPY;  Service: Endoscopy;  Laterality: N/A;   ESOPHAGOGASTRODUODENOSCOPY (EGD) WITH PROPOFOL N/A 08/07/2017   Procedure: ESOPHAGOGASTRODUODENOSCOPY (EGD) WITH PROPOFOL;  Surgeon: Lollie Sails, MD;  Location: [facility] ENDOSCOPY;  Service: Endoscopy;  Laterality: N/A;   JOINT REPLACEMENT Right 2009   JOINT REPLACEMENT Left 2015   lithopexy     PROSTATE BIOPSY  2012   malignant   REPLACEMENT TOTAL KNEE Bilateral     Prior to Admission medications   Medication Sig Start Date End Date Taking? Authorizing Provider  aspirin 325 MG tablet Take by mouth. Pt does not take daily    [provider]  baclofen (LIORESAL) 10 MG tablet Take 1 tablet (10 mg total) by mouth 3 (three) times daily as needed for muscle spasms. 01/28/21   Glean Hess, MD  enzalutamide Gillermina Phy) 40 MG  capsule Take by mouth. 08/04/20 08/04/21  [provider]  Leuprolide Acetate, 6 Month, (LUPRON DEPOT, 75-MONTH,) 45 MG injection Inject 45 mg into the muscle every 3 (three) months.    [provider]  losartan (COZAAR) 25 MG tablet Take 1 tablet (25 mg total) by mouth daily. 12/23/19   Glean Hess, MD  metFORMIN (GLUCOPHAGE) 1000 MG tablet TAKE 1 TABLET BY MOUTH TWICE A DAY 10/20/20   Glean Hess, MD  Multiple Vitamins-Minerals  (MULTIVITAMIN WITH MINERALS) tablet Take 1 tablet by mouth daily.    [provider]  omeprazole (PRILOSEC) 20 MG capsule TAKE 1 CAPSULE (20 MG TOTAL) BY MOUTH 2 (TWO) TIMES DAILY BEFORE A MEAL. Patient taking differently: Take 20 mg by mouth daily. 07/08/20   Glean Hess, MD  pravastatin (PRAVACHOL) 20 MG tablet Take 1 tablet (20 mg total) by mouth daily. 04/27/20   Glean Hess, MD  tolterodine (DETROL LA) 2 MG 24 hr capsule Take 1 capsule (2 mg total) by mouth daily. 01/28/21   Glean Hess, MD    Allergies Ace inhibitors  Family History  Problem Relation Age of Onset   Diabetes Mother    Colon cancer Mother    Colon cancer Father    Prostate cancer Neg Hx    Bladder Cancer Neg Hx    Kidney cancer Neg Hx     Social History Social History   Tobacco Use   Smoking status: Former    Pack years: 0.00    Types: Cigarettes    Quit date: 1976    Years since quitting: 46.5   Smokeless tobacco: Never  Vaping Use   Vaping Use: Never used  Substance Use Topics   Alcohol use: Yes    Alcohol/week: 14.0 standard drinks    Types: 14 Shots of liquor per week    Comment: 2 scotch drinks per night   Drug use: No     Review of Systems  Constitutional: No fever/chills Eyes: No visual changes. No discharge ENT: No upper respiratory complaints. Cardiovascular: no chest pain. Respiratory: no cough. No SOB. Gastrointestinal: No abdominal pain.  No nausea, no vomiting.  No diarrhea.  No constipation. Musculoskeletal: Positive for neck pain Skin: Negative for rash, abrasions, lacerations, ecchymosis. Neurological: Headache after initial injury, none currently.  Denies focal weakness or numbness.  10 System ROS otherwise negative.  ____________________________________________   PHYSICAL EXAM:  VITAL SIGNS: ED Triage Vitals  Enc Vitals Group     BP 02/28/21 1431 111/73     Pulse Rate 02/28/21 1431 99     Resp 02/28/21 1431 16     Temp 02/28/21 1431 98.6 F  (37 C)     Temp Source 02/28/21 1431 Oral     SpO2 02/28/21 1431 98 %     Weight 02/28/21 1428 184 lb 15.5 oz (83.9 kg)     Height 02/28/21 1428 5\' 8"  (1.727 m)     Head Circumference --      Peak Flow --      Pain Score 02/28/21 1428 6     Pain Loc --      Pain Edu? --      Excl. in [place]? --      Constitutional: Alert and oriented. Well appearing and in no acute distress. Eyes: Conjunctivae are normal. PERRL. EOMI. Head: Atraumatic. ENT:      Ears:       Nose: No congestion/rhinnorhea.      Mouth/Throat: Mucous membranes  are moist.  Neck: No stridor.  Positive for midline and bilateral cervical spine tenderness to palpation over C6 prominence.  There is no palpable deformity.  No other tenderness along the cervical spine or paraspinal muscle groups.  No extension into the thoracic spine.  Radial pulses sensation intact and equal bilateral upper extremities.  Full range of motion to bilateral upper extremities. Cardiovascular: Normal rate, regular rhythm. Normal S1 and S2.  Good peripheral circulation. Respiratory: Normal respiratory effort without tachypnea or retractions. Lungs CTAB. Good air entry to the bases with no decreased or absent breath sounds. Musculoskeletal: Full range of motion to all extremities. No gross deformities appreciated. Neurologic:  Normal speech and language. No gross focal neurologic deficits are appreciated.  Cranial nerves II through XII grossly intact. Skin:  Skin is warm, dry and intact. No rash noted. Psychiatric: Mood and affect are normal. Speech and behavior are normal. Patient exhibits appropriate insight and judgement.   ____________________________________________   LABS (all labs ordered are listed, but only abnormal results are displayed)  Labs Reviewed - No data to display ____________________________________________  EKG   ____________________________________________  RADIOLOGY I personally viewed and evaluated these images as part  of my medical decision making, as well as reviewing the written report by the radiologist.  ED Provider Interpretation: Imaging of the head neck reveals no acute fractures, intracranial hemorrhage.  Chronic changes are identified on imaging.  CT Head Wo Contrast  Result Date: 02/28/2021 CLINICAL DATA:  Head and neck trauma, pain, injury EXAM: CT HEAD WITHOUT CONTRAST CT CERVICAL SPINE WITHOUT CONTRAST TECHNIQUE: Multidetector CT imaging of the head and cervical spine was performed following the standard protocol without intravenous contrast. Multiplanar CT image reconstructions of the cervical spine were also generated. COMPARISON:  None. FINDINGS: CT HEAD FINDINGS Brain: Diffuse brain atrophy pattern and white matter microvascular ischemic changes throughout both cerebral hemispheres. No acute intracranial hemorrhage, definite new infarction, mass lesion, midline shift, herniation, hydrocephalus, or extra-axial fluid collection. No focal mass effect or edema. Cisterns are patent. Cerebellar atrophy as well. Vascular: Intracranial atherosclerosis at the skull base. No hyperdense vessel. Skull: Normal. Negative for fracture or focal lesion. Sinuses/Orbits: No acute finding. Other: None. CT CERVICAL SPINE FINDINGS Alignment: Mild cervical kyphotic curvature centered at C4-5, secondary to advanced cervical spondylosis and anterior fused osteophytes. Skull base and vertebrae: No acute fracture. No primary bone lesion or focal pathologic process. Soft tissues and spinal canal: No prevertebral fluid or swelling. No visible canal hematoma. Disc levels: Advanced cervical degenerative disc disease at all visualized levels with disc space narrowing, sclerosis, and large anterior osteophytes some of which are fused. Degenerative changes of C1-2 articulation as well. Upper chest: Upper thoracic and sternoclavicular joint degenerative changes. Atherosclerosis present. Clear lung apices. Other: None. IMPRESSION: Brain  atrophy and chronic white matter microvascular ischemic changes. No acute intracranial abnormality by noncontrast CT. Advanced cervical spondylosis affecting all levels with associated cervical kyphotic curvature centered at C4-5. No definite acute cervical spine fracture by CT. Electronically Signed   By: Jerilynn Mages.  Shick M.D.   On: 02/28/2021 15:24   CT Cervical Spine Wo Contrast  Result Date: 02/28/2021 CLINICAL DATA:  Head and neck trauma, pain, injury EXAM: CT HEAD WITHOUT CONTRAST CT CERVICAL SPINE WITHOUT CONTRAST TECHNIQUE: Multidetector CT imaging of the head and cervical spine was performed following the standard protocol without intravenous contrast. Multiplanar CT image reconstructions of the cervical spine were also generated. COMPARISON:  None. FINDINGS: CT HEAD FINDINGS Brain: Diffuse brain atrophy pattern and  white matter microvascular ischemic changes throughout both cerebral hemispheres. No acute intracranial hemorrhage, definite new infarction, mass lesion, midline shift, herniation, hydrocephalus, or extra-axial fluid collection. No focal mass effect or edema. Cisterns are patent. Cerebellar atrophy as well. Vascular: Intracranial atherosclerosis at the skull base. No hyperdense vessel. Skull: Normal. Negative for fracture or focal lesion. Sinuses/Orbits: No acute finding. Other: None. CT CERVICAL SPINE FINDINGS Alignment: Mild cervical kyphotic curvature centered at C4-5, secondary to advanced cervical spondylosis and anterior fused osteophytes. Skull base and vertebrae: No acute fracture. No primary bone lesion or focal pathologic process. Soft tissues and spinal canal: No prevertebral fluid or swelling. No visible canal hematoma. Disc levels: Advanced cervical degenerative disc disease at all visualized levels with disc space narrowing, sclerosis, and large anterior osteophytes some of which are fused. Degenerative changes of C1-2 articulation as well. Upper chest: Upper thoracic and  sternoclavicular joint degenerative changes. Atherosclerosis present. Clear lung apices. Other: None. IMPRESSION: Brain atrophy and chronic white matter microvascular ischemic changes. No acute intracranial abnormality by noncontrast CT. Advanced cervical spondylosis affecting all levels with associated cervical kyphotic curvature centered at C4-5. No definite acute cervical spine fracture by CT. Electronically Signed   By: Jerilynn Mages.  Shick M.D.   On: 02/28/2021 15:24    ____________________________________________    PROCEDURES  Procedure(s) performed:    Procedures    Medications - No data to display   ____________________________________________   INITIAL IMPRESSION / ASSESSMENT AND PLAN / ED COURSE  Pertinent labs & imaging results that were available during my care of the patient were reviewed by me and considered in my medical decision making (see chart for details).  Review of the [place] CSRS was performed in accordance of the [place] prior to dispensing any controlled drugs.           Patient's diagnosis is consistent with minor head injury, contusion and sprain of the cervical spine.  Patient presents to the ED for ongoing neck pain after falling off a stepstool 4 days ago.  He had no loss of consciousness initially.  Had a headache and neck pain initially but headache has completely resolved.  Patient is here for ongoing neck pain.  No radiation of symptoms down the upper extremities.  Patient with full range of motion to the cervical spine and bilateral upper extremities.  Imaging was reassuring with no acute fractures.  Exam is reassuring at this time.  Suspect component of contusion and strain of the cervical spine.  Patient will have short course of steroid and muscle relaxer.  Have cautioned the patient on use of muscle relaxer with his potential side effects.  Patient verbalizes understanding of same.  Patient is driving so I will provide dose of oral steroids here and patient will  pick up prescription and continue steroids and muscle relaxer at home..  Return precautions discussed with the patient.  Otherwise follow-up primary care.  Patient is given ED precautions to return to the ED for any worsening or new symptoms.     ____________________________________________  FINAL CLINICAL IMPRESSION(S) / ED DIAGNOSES  Final diagnoses:  Fall, initial encounter  Minor head injury, initial encounter  Acute strain of neck muscle, initial encounter      NEW MEDICATIONS STARTED DURING THIS VISIT:  ED Discharge Orders     None           This chart was dictated using voice recognition software/Dragon. Despite best efforts to proofread, errors can occur which can change the meaning. Any change was  purely unintentional.    Darletta Moll, PA-C 02/28/21 [place], MD 02/28/21 2040

## 2021-03-19 ENCOUNTER — Other Ambulatory Visit: Payer: Self-pay | Admitting: Internal Medicine

## 2021-03-19 DIAGNOSIS — E118 Type 2 diabetes mellitus with unspecified complications: Secondary | ICD-10-CM

## 2021-03-19 NOTE — Telephone Encounter (Signed)
Requested Prescriptions  Pending Prescriptions Disp Refills  . losartan (COZAAR) 25 MG tablet [Pharmacy Med Name: LOSARTAN POTASSIUM 25 MG TAB] 90 tablet 1    Sig: TAKE 1 TABLET BY MOUTH EVERY DAY     Cardiovascular:  Angiotensin Receptor Blockers Passed - 03/19/2021 12:54 AM      Passed - Cr in normal range and within 180 days    Creatinine  Date Value Ref Range Status  01/19/2021 0.9 0.6 - 1.3 Final  08/24/2013 0.76 0.60 - 1.30 mg/dL Final   Creatinine, Ser  Date Value Ref Range Status  01/05/2021 1.26 (H) 0.61 - 1.24 mg/dL Final         Passed - K in normal range and within 180 days    Potassium  Date Value Ref Range Status  01/05/2021 4.4 3.5 - 5.1 mmol/L Final  08/24/2013 3.5 3.5 - 5.1 mmol/L Final         Passed - Patient is not pregnant      Passed - Last BP in normal range    BP Readings from Last 1 Encounters:  02/28/21 111/73         Passed - Valid encounter within last 6 months    Recent Outpatient Visits          1 month ago Urinary frequency   [facility] Glean Hess, MD   6 months ago Frequent urination   [facility] Juline Patch, MD   7 months ago Type II diabetes mellitus with complication [facility])   [facility] Glean Hess, MD   10 months ago Type II diabetes mellitus with complication [facility])   [facility] Glean Hess, MD   1 year ago Type II diabetes mellitus with complication [facility])   [facility] Glean Hess, MD      Future Appointments            In 4 months Army Melia Jesse Sans, MD [facility]

## 2021-03-23 ENCOUNTER — Ambulatory Visit: Payer: Self-pay | Admitting: *Deleted

## 2021-03-23 ENCOUNTER — Other Ambulatory Visit: Payer: Self-pay

## 2021-03-23 DIAGNOSIS — M542 Cervicalgia: Secondary | ICD-10-CM

## 2021-03-23 DIAGNOSIS — W19XXXA Unspecified fall, initial encounter: Secondary | ICD-10-CM

## 2021-03-23 NOTE — Telephone Encounter (Signed)
Sent urgent referral to orthopedic for neck pain.

## 2021-03-23 NOTE — Telephone Encounter (Signed)
Requesting referral to orthopedic surgeon- patient had fall 2 weeks ago. Patient did go to [facility](6[facility])- was told he would be fine in 5 days. Patient's wife states he is not fine- he is unable to hold his head straight up- "looks like a turtle" and they would like a referral. She does not want to speak to triage- she states it is not necessary- she is requesting PCP nurse to call her back. Call to office- nurse is with patient- request note sent to office and she will reply. Please call [phone] Reason for Disposition  [1] Caller has NON-URGENT question AND [2] triager unable to answer question  Answer Assessment - Initial Assessment Questions 1. MECHANISM: "How did the fall happen?"     Decline triage- fas been seen at [facility] for this- not better 2. DOMESTIC VIOLENCE AND ELDER ABUSE SCREENING: "Did you fall because someone pushed you or tried to hurt you?" If Yes, ask: "Are you safe now?"     *No Answer* 3. ONSET: "When did the fall happen?" (e.g., minutes, hours, or days ago)     *No Answer* 4. LOCATION: "What part of the body hit the ground?" (e.g., back, buttocks, head, hips, knees, hands, head, stomach)     *No Answer* 5. INJURY: "Did you hurt (injure) yourself when you fell?" If Yes, ask: "What did you injure? Tell me more about this?" (e.g., body area; type of injury; pain severity)"     *No Answer* 6. PAIN: "Is there any pain?" If Yes, ask: "How bad is the pain?" (e.g., Scale 1-10; or mild,  moderate, severe)   - NONE (0): No pain   - MILD (1-3): Doesn't interfere with normal activities    - MODERATE (4-7): Interferes with normal activities or awakens from sleep    - SEVERE (8-10): Excruciating pain, unable to do any normal activities      *No Answer* 7. SIZE: For cuts, bruises, or swelling, ask: "How large is it?" (e.g., inches or centimeters)      *No Answer* 8. PREGNANCY: "Is there any chance you are pregnant?" "When was your last menstrual period?"     *No Answer* 9. OTHER  SYMPTOMS: "Do you have any other symptoms?" (e.g., dizziness, fever, weakness; new onset or worsening).      *No Answer* 10. CAUSE: "What do you think caused the fall (or falling)?" (e.g., tripped, dizzy spell)       *No Answer*  Protocols used: Falls and [facility]

## 2021-04-12 DIAGNOSIS — M47812 Spondylosis without myelopathy or radiculopathy, cervical region: Secondary | ICD-10-CM | POA: Diagnosis not present

## 2021-04-15 ENCOUNTER — Other Ambulatory Visit: Payer: Self-pay | Admitting: Internal Medicine

## 2021-04-15 DIAGNOSIS — E119 Type 2 diabetes mellitus without complications: Secondary | ICD-10-CM

## 2021-04-20 DIAGNOSIS — Z79818 Long term (current) use of other agents affecting estrogen receptors and estrogen levels: Secondary | ICD-10-CM | POA: Diagnosis not present

## 2021-04-20 DIAGNOSIS — Z79899 Other long term (current) drug therapy: Secondary | ICD-10-CM | POA: Diagnosis not present

## 2021-04-20 DIAGNOSIS — C61 Malignant neoplasm of prostate: Secondary | ICD-10-CM | POA: Diagnosis not present

## 2021-04-20 DIAGNOSIS — C772 Secondary and unspecified malignant neoplasm of intra-abdominal lymph nodes: Secondary | ICD-10-CM | POA: Diagnosis not present

## 2021-05-17 DIAGNOSIS — M542 Cervicalgia: Secondary | ICD-10-CM | POA: Diagnosis not present

## 2021-05-25 DIAGNOSIS — M542 Cervicalgia: Secondary | ICD-10-CM | POA: Diagnosis not present

## 2021-05-27 DIAGNOSIS — M542 Cervicalgia: Secondary | ICD-10-CM | POA: Diagnosis not present

## 2021-05-30 DIAGNOSIS — M542 Cervicalgia: Secondary | ICD-10-CM | POA: Diagnosis not present

## 2021-06-03 DIAGNOSIS — M542 Cervicalgia: Secondary | ICD-10-CM | POA: Diagnosis not present

## 2021-06-07 DIAGNOSIS — M542 Cervicalgia: Secondary | ICD-10-CM | POA: Diagnosis not present

## 2021-06-10 DIAGNOSIS — M542 Cervicalgia: Secondary | ICD-10-CM | POA: Diagnosis not present

## 2021-06-13 ENCOUNTER — Telehealth: Payer: Self-pay

## 2021-06-13 DIAGNOSIS — E119 Type 2 diabetes mellitus without complications: Secondary | ICD-10-CM | POA: Diagnosis not present

## 2021-06-13 DIAGNOSIS — H40013 Open angle with borderline findings, low risk, bilateral: Secondary | ICD-10-CM | POA: Diagnosis not present

## 2021-06-13 DIAGNOSIS — H2513 Age-related nuclear cataract, bilateral: Secondary | ICD-10-CM | POA: Diagnosis not present

## 2021-06-13 NOTE — Telephone Encounter (Signed)
Please call pt to schedule an appointment. Pt needs to be seen before a referral can be placed.   KP

## 2021-06-13 NOTE — Telephone Encounter (Signed)
Copied from [place] [id number]. Topic: Referral - Request for Referral >> Jun 13, 2021 11:33 AM Robina Ade, Helene Kelp D wrote: Has patient seen PCP for this complaint? No.   *If NO, is insurance requiring patient see PCP for this issue before PCP can refer them? Referral for which specialty: Gastroenterology Preferred provider/office: [facility] Reason for referral: Patient has been having diarrhea for about 3-4 month and is concern and would like to talk to Dr. B CMA about this and having a referral to see Gastro.

## 2021-06-14 DIAGNOSIS — M542 Cervicalgia: Secondary | ICD-10-CM | POA: Diagnosis not present

## 2021-06-17 DIAGNOSIS — M542 Cervicalgia: Secondary | ICD-10-CM | POA: Diagnosis not present

## 2021-06-20 DIAGNOSIS — M542 Cervicalgia: Secondary | ICD-10-CM | POA: Diagnosis not present

## 2021-06-21 ENCOUNTER — Other Ambulatory Visit: Payer: Self-pay

## 2021-06-21 ENCOUNTER — Ambulatory Visit (INDEPENDENT_AMBULATORY_CARE_PROVIDER_SITE_OTHER): Payer: Medicare Other | Admitting: Internal Medicine

## 2021-06-21 ENCOUNTER — Encounter: Payer: Self-pay | Admitting: Internal Medicine

## 2021-06-21 VITALS — BP 128/78 | Ht 68.0 in | Wt 173.2 lb

## 2021-06-21 DIAGNOSIS — R197 Diarrhea, unspecified: Secondary | ICD-10-CM | POA: Diagnosis not present

## 2021-06-21 DIAGNOSIS — K921 Melena: Secondary | ICD-10-CM

## 2021-06-21 NOTE — Progress Notes (Signed)
Date:  06/21/2021   Name:  Thomas Allen   DOB:  1941-01-19   MRN:  [id number]   Chief Complaint: Diarrhea (X 2 weeks. Black Stools every time. )  Diarrhea  This is a new problem. The current episode started 1 to 4 weeks ago. The problem occurs 2 to 4 times per day. The problem has been unchanged. The stool consistency is described as Watery (and black). The patient states that diarrhea does not awaken him from sleep. Associated symptoms include weight loss (steady wt loss over the past few years). Pertinent negatives include no abdominal pain, bloating, chills, coughing, fever, headaches or vomiting. Nothing aggravates the symptoms. There are no known risk factors. He has tried bismuth subsalicylate for the symptoms. The treatment provided no relief. There is no history of bowel resection, inflammatory bowel disease or a recent abdominal surgery. he is on medication for prostate cancer   Lab Results  Component Value Date   CREATININE 0.9 01/19/2021   BUN 18 01/19/2021   NA 133 (L) 01/05/2021   K 4.4 01/05/2021   CL 99 01/05/2021   CO2 19 (L) 01/05/2021   Lab Results  Component Value Date   CHOL 166 12/23/2019   HDL 44 12/23/2019   LDLCALC 101 (H) 12/23/2019   TRIG 115 12/23/2019   CHOLHDL 3.8 12/23/2019   Lab Results  Component Value Date   TSH 2.040 02/05/2017   Lab Results  Component Value Date   HGBA1C 5.9 01/19/2021   Lab Results  Component Value Date   WBC 8.1 01/19/2021   HGB 14.8 01/19/2021   HCT 39.3 01/05/2021   MCV 98.5 01/05/2021   PLT 257 01/19/2021   Lab Results  Component Value Date   ALT 13 01/05/2021   AST 17 01/05/2021   ALKPHOS 58 01/05/2021   BILITOT 1.5 (H) 01/05/2021     Review of Systems  Constitutional:  Positive for appetite change, fatigue, unexpected weight change (7 lbs since July) and weight loss (steady wt loss over the past few years). Negative for chills, diaphoresis and fever.  HENT:  Negative for trouble swallowing.    Respiratory:  Negative for cough, shortness of breath and wheezing.   Cardiovascular:  Negative for chest pain and palpitations.  Gastrointestinal:  Positive for diarrhea. Negative for abdominal pain, bloating, blood in stool, constipation and vomiting.       Stools have been consistent black but not tarry  Neurological:  Negative for dizziness and headaches.  Psychiatric/Behavioral:  Negative for dysphoric mood and sleep disturbance. The patient is not nervous/anxious.    Patient Active Problem List   Diagnosis Date Noted   Low back strain, initial encounter 01/28/2021   Hand pain, right 08/23/2017   Erectile dysfunction following radiation therapy 06/26/2017   Urinary frequency 06/18/2017   Hyperlipidemia associated with type 2 diabetes mellitus ([place]) 03/12/2017   Asymptomatic PVCs 02/05/2017   Gastroesophageal reflux disease 02/05/2017   Alcohol use disorder 01/19/2016   Type II diabetes mellitus with complication ([place]) 44/96/7591   Carpal tunnel syndrome 03/30/2015   Elevated blood pressure, situational 03/30/2015   Abnormal LFTs 03/30/2015   H/O adenomatous polyp of colon 03/30/2015   CA of prostate ([place]) 03/30/2015   Tobacco use disorder, moderate, in sustained remission 03/30/2015   Prostate cancer ([place]) 09/02/2013   Posterior calcaneal exostosis 02/26/2013   Calcific Achilles tendinitis 02/26/2013    Allergies  Allergen Reactions   Ace Inhibitors Cough    Past Surgical History:  Procedure  Laterality Date   APPENDECTOMY     COLONOSCOPY  2008   benign polyps   COLONOSCOPY WITH PROPOFOL N/A 08/07/2017   Procedure: COLONOSCOPY WITH PROPOFOL;  Surgeon: Lollie Sails, MD;  Location: [facility] ENDOSCOPY;  Service: Endoscopy;  Laterality: N/A;   COLONOSCOPY WITH PROPOFOL N/A 12/03/2020   Procedure: COLONOSCOPY WITH PROPOFOL;  Surgeon: Lesly Rubenstein, MD;  Location: [facility] ENDOSCOPY;  Service: Endoscopy;  Laterality: N/A;   ESOPHAGOGASTRODUODENOSCOPY (EGD) WITH PROPOFOL  N/A 08/07/2017   Procedure: ESOPHAGOGASTRODUODENOSCOPY (EGD) WITH PROPOFOL;  Surgeon: Lollie Sails, MD;  Location: [facility] ENDOSCOPY;  Service: Endoscopy;  Laterality: N/A;   JOINT REPLACEMENT Right 2009   JOINT REPLACEMENT Left 2015   lithopexy     PROSTATE BIOPSY  2012   malignant   REPLACEMENT TOTAL KNEE Bilateral     Social History   Tobacco Use   Smoking status: Former    Types: Cigarettes    Quit date: 1976    Years since quitting: 46.8   Smokeless tobacco: Never  Vaping Use   Vaping Use: Never used  Substance Use Topics   Alcohol use: Yes    Alcohol/week: 14.0 standard drinks    Types: 14 Shots of liquor per week    Comment: 2 scotch drinks per night   Drug use: No     Medication list has been reviewed and updated.  Current Meds  Medication Sig   enzalutamide (XTANDI) 40 MG capsule Take 120 mg by mouth daily.   metFORMIN (GLUCOPHAGE) 1000 MG tablet TAKE 1 TABLET BY MOUTH TWICE A DAY   Multiple Vitamins-Minerals (MULTIVITAMIN WITH MINERALS) tablet Take 1 tablet by mouth daily.   omeprazole (PRILOSEC) 20 MG capsule TAKE 1 CAPSULE (20 MG TOTAL) BY MOUTH 2 (TWO) TIMES DAILY BEFORE A MEAL. (Patient taking differently: Take 20 mg by mouth daily.)    PHQ 2/9 Scores 01/28/2021 12/13/2020 08/16/2020 12/23/2019  PHQ - 2 Score 0 0 0 0  PHQ- 9 Score 2 - 0 0    GAD 7 : Generalized Anxiety Score 01/28/2021 08/16/2020 12/23/2019  Nervous, Anxious, on Edge 0 0 0  Control/stop worrying 0 0 0  Worry too much - different things 0 0 0  Trouble relaxing 0 0 0  Restless 0 0 0  Easily annoyed or irritable 0 0 0  Afraid - awful might happen 0 0 0  Total GAD 7 Score 0 0 0  Anxiety Difficulty Not difficult at all - Not difficult at all    BP Readings from Last 3 Encounters:  06/21/21 128/78  02/28/21 111/73  01/28/21 92/62    Physical Exam Vitals and nursing note reviewed.  Constitutional:      General: He is not in acute distress.    Appearance: Normal appearance. He is  well-developed.  HENT:     Head: Normocephalic and atraumatic.  Cardiovascular:     Rate and Rhythm: Normal rate and regular rhythm.     Pulses: Normal pulses.     Heart sounds: No murmur heard. Pulmonary:     Effort: Pulmonary effort is normal. No respiratory distress.     Breath sounds: No wheezing or rhonchi.  Abdominal:     General: Abdomen is flat.     Palpations: Abdomen is soft.     Tenderness: There is no abdominal tenderness.  Genitourinary:    Rectum: Guaiac result negative. No mass, tenderness or anal fissure.     Comments: Dark black mucously stool - tests heme negative Musculoskeletal:  Cervical back: Normal range of motion.     Right lower leg: No edema.     Left lower leg: No edema.  Lymphadenopathy:     Cervical: No cervical adenopathy.  Skin:    General: Skin is warm and dry.     Findings: No rash.  Neurological:     Mental Status: He is alert and oriented to person, place, and time.  Psychiatric:        Mood and Affect: Mood normal.        Behavior: Behavior normal.    Wt Readings from Last 3 Encounters:  06/21/21 173 lb 3.2 oz (78.6 kg)  02/28/21 184 lb 15.5 oz (83.9 kg)  01/28/21 185 lb (83.9 kg)    BP 128/78   Ht 5\' 8"  (1.727 m)   Wt 173 lb 3.2 oz (78.6 kg)   BMI 26.33 kg/m   Assessment and Plan: 1. Diarrhea, unspecified type Will get labs and obtain GI profile. Continue fluids and avoid Pepto-Bismol. Go to ER if he develops syncope, chest pain, etc - Basic metabolic panel - GI Profile, Stool, PCR  2. [place]ena Stool test today was negative for blood Will get CBC to verify stability. - CBC with Differential/Platelet   Partially dictated using Editor, commissioning. Any errors are unintentional.  Halina Maidens, MD [facility]  06/21/2021

## 2021-06-22 DIAGNOSIS — R197 Diarrhea, unspecified: Secondary | ICD-10-CM | POA: Diagnosis not present

## 2021-06-22 DIAGNOSIS — K529 Noninfective gastroenteritis and colitis, unspecified: Secondary | ICD-10-CM | POA: Diagnosis not present

## 2021-06-22 DIAGNOSIS — K921 Melena: Secondary | ICD-10-CM | POA: Diagnosis not present

## 2021-06-24 ENCOUNTER — Telehealth: Payer: Self-pay

## 2021-06-24 ENCOUNTER — Ambulatory Visit: Payer: Self-pay | Admitting: *Deleted

## 2021-06-24 DIAGNOSIS — M542 Cervicalgia: Secondary | ICD-10-CM | POA: Diagnosis not present

## 2021-06-24 NOTE — Telephone Encounter (Signed)
Received results, everything accept the stool test ( still pending ). Spoke with patient and informed no evidence of blood loss. No anemia. Blood count good. Pending stool test.

## 2021-06-24 NOTE — Telephone Encounter (Signed)
Reason for Disposition  [1] MODERATE weakness (i.e., interferes with work, school, normal activities) AND [2] persists > 3 days  Answer Assessment - Initial Assessment Questions 1. DESCRIPTION: "Describe how you are feeling."     Fatigue . Worried about results due to "black stools" 2. SEVERITY: "How bad is it?"  "Can you stand and walk?"   - MILD - Feels weak or tired, but does not interfere with work, school or normal activities   - [place] to stand and walk; weakness interferes with work, school, or normal activities   - SEVERE - Unable to stand or walk     Can walk just tired  3. ONSET:  "When did the weakness begin?"     Monday 06/20/21 4. CAUSE: "What do you think is causing the weakness?"     Diarrhea x 15 days  5. MEDICINES: "Have you recently started a new medicine or had a change in the amount of a medicine?"     Took kaopectate and pepto bismol last on Monday  6. OTHER SYMPTOMS: "Do you have any other symptoms?" (e.g., chest pain, fever, cough, SOB, vomiting, diarrhea, bleeding, other areas of pain)     No  7. PREGNANCY: "Is there any chance you are pregnant?" "When was your last menstrual period?"     na  Protocols used: Weakness (Generalized) and Fatigue-A-AH

## 2021-06-24 NOTE — Telephone Encounter (Signed)
Copied from [place] [phone] [id number]. Topic: General - Other >> Jun 24, 2021  9:57 AM Holley Dexter N wrote: Reason for CRM: Pts wife called in stating pt stool is still black, after doing stool sample, and that they would like someone to give them a call back, please advise.

## 2021-06-24 NOTE — Telephone Encounter (Signed)
C/o black stools and fatigue . C/o not being notified of results from [employer] for testing black stools. Denies fever, abdominal pain, blood in stool, no vomiting, no dizziness.  Patient reports diarrhea x 15 days and took kaopectate and pepto bismol last on Monday 06/20/21. Patient requesting to hear results from stool sample from PCP and is expressing concern. Please advise . Reviewed with patient if he continues to have black stools and no longer taking pepto bismol or kaopectate he can be evaluating in the ED. Patient would like to speak with PCP. Emotional support provided. Care advise given. Patient verbalized understanding of care advise and to call back or go to [facility] or ED if symptoms worsen. Patient does not want to go to ED at this time and awaiting call from PCP.

## 2021-06-24 NOTE — Telephone Encounter (Signed)
Called [facility] to request lab results since we still have not received them.

## 2021-06-26 LAB — GI PROFILE, STOOL, PCR

## 2021-06-26 LAB — CBC WITH DIFFERENTIAL/PLATELET
Basophils Absolute: 0 10*3/uL (ref 0.0–0.2)
Basos: 0 %
EOS (ABSOLUTE): 0.1 10*3/uL (ref 0.0–0.4)
Eos: 2 %
Hematocrit: 40.4 % (ref 37.5–51.0)
Hemoglobin: 13.6 g/dL (ref 13.0–17.7)
Immature Grans (Abs): 0 10*3/uL (ref 0.0–0.1)
Immature Granulocytes: 0 %
Lymphocytes Absolute: 1.8 10*3/uL (ref 0.7–3.1)
Lymphs: 22 %
MCH: 33.8 pg — ABNORMAL HIGH (ref 26.6–33.0)
MCHC: 33.7 g/dL (ref 31.5–35.7)
MCV: 101 fL — ABNORMAL HIGH (ref 79–97)
Monocytes Absolute: 0.7 10*3/uL (ref 0.1–0.9)
Monocytes: 8 %
Neutrophils Absolute: 5.6 10*3/uL (ref 1.4–7.0)
Neutrophils: 68 %
Platelets: 234 10*3/uL (ref 150–450)
RBC: 4.02 x10E6/uL — ABNORMAL LOW (ref 4.14–5.80)
RDW: 11.9 % (ref 11.6–15.4)
WBC: 8.2 10*3/uL (ref 3.4–10.8)

## 2021-06-26 LAB — BASIC METABOLIC PANEL
BUN/Creatinine Ratio: 20 (ref 10–24)
BUN: 26 mg/dL (ref 8–27)
CO2: 17 mmol/L — ABNORMAL LOW (ref 20–29)
Calcium: 10 mg/dL (ref 8.6–10.2)
Chloride: 92 mmol/L — ABNORMAL LOW (ref 96–106)
Creatinine, Ser: 1.33 mg/dL — ABNORMAL HIGH (ref 0.76–1.27)
Glucose: 81 mg/dL (ref 70–99)
Potassium: 4.4 mmol/L (ref 3.5–5.2)
Sodium: 132 mmol/L — ABNORMAL LOW (ref 134–144)
eGFR: 54 mL/min/{1.73_m2} — ABNORMAL LOW (ref >59)

## 2021-06-27 ENCOUNTER — Other Ambulatory Visit: Payer: Self-pay

## 2021-06-27 ENCOUNTER — Telehealth: Payer: Self-pay

## 2021-06-27 DIAGNOSIS — M542 Cervicalgia: Secondary | ICD-10-CM | POA: Diagnosis not present

## 2021-06-27 DIAGNOSIS — R197 Diarrhea, unspecified: Secondary | ICD-10-CM

## 2021-06-27 DIAGNOSIS — K921 Melena: Secondary | ICD-10-CM

## 2021-06-27 NOTE — Telephone Encounter (Signed)
Patient informed of GI referral for labs.

## 2021-06-27 NOTE — Telephone Encounter (Signed)
Copied from [phone]. Topic: General - Other >> Jun 27, 2021 12:32 PM Alanda Slim E wrote: Reason for CRM: Pt returned chassidy's call/ please advise

## 2021-06-29 NOTE — Telephone Encounter (Signed)
Patient called back to speak to [place] say tht he have not heard from other Dr and that the diarrhea is back. Can be reached at Ph#  [phone]

## 2021-06-30 DIAGNOSIS — M47812 Spondylosis without myelopathy or radiculopathy, cervical region: Secondary | ICD-10-CM | POA: Diagnosis not present

## 2021-06-30 NOTE — Telephone Encounter (Signed)
Sent message to referral coordinator and Dr Allen Norris office. Patient is already established with [facility] so would need to remain there. Referral Coordinator Lexine Baton said she will try to get pt in sooner than January.

## 2021-07-04 DIAGNOSIS — R197 Diarrhea, unspecified: Secondary | ICD-10-CM | POA: Diagnosis not present

## 2021-07-15 ENCOUNTER — Other Ambulatory Visit: Payer: Self-pay | Admitting: Internal Medicine

## 2021-07-15 DIAGNOSIS — E119 Type 2 diabetes mellitus without complications: Secondary | ICD-10-CM

## 2021-07-15 NOTE — Telephone Encounter (Signed)
Requested Prescriptions  Pending Prescriptions Disp Refills  . metFORMIN (GLUCOPHAGE) 1000 MG tablet [Pharmacy Med Name: METFORMIN HCL 1,000 MG TABLET] 180 tablet 0    Sig: TAKE 1 TABLET BY MOUTH TWICE A DAY     Endocrinology:  Diabetes - Biguanides Failed - 07/15/2021  2:50 AM      Failed - Cr in normal range and within 360 days    Creatinine  Date Value Ref Range Status  08/24/2013 0.76 0.60 - 1.30 mg/dL Final   Creatinine, Ser  Date Value Ref Range Status  06/22/2021 1.33 (H) 0.76 - 1.27 mg/dL Final         Failed - eGFR in normal range and within 360 days    EGFR (African American)  Date Value Ref Range Status  08/24/2013 >60  Final   GFR calc Af Amer  Date Value Ref Range Status  08/16/2020 84 >59 mL/min/1.73 Final    Comment:    **In accordance with recommendations from the NKF-ASN Task force,**   [facility] is in the process of updating its eGFR calculation to the   2021 CKD-EPI creatinine equation that estimates kidney function   without a race variable.    EGFR (Non-African Amer.)  Date Value Ref Range Status  08/24/2013 >60  Final    Comment:    eGFR values <43mL/min/1.73 m2 may be an indication of chronic kidney disease (CKD). Calculated eGFR is useful in patients with stable renal function. The eGFR calculation will not be reliable in acutely ill patients when serum creatinine is changing rapidly. It is not useful in  patients on dialysis. The eGFR calculation may not be applicable to patients at the low and high extremes of body sizes, pregnant women, and vegetarians. POTASSIUM - Slight hemolysis, interpret results with  - caution.    GFR, Estimated  Date Value Ref Range Status  01/05/2021 58 (L) >60 mL/min Final    Comment:    (NOTE) Calculated using the CKD-EPI Creatinine Equation (2021)    eGFR  Date Value Ref Range Status  06/22/2021 54 (L) >59 mL/min/1.73 Final         Passed - HBA1C is between 0 and 7.9 and within 180 days    Hemoglobin  A1C  Date Value Ref Range Status  01/19/2021 5.9  Final         Passed - Valid encounter within last 6 months    Recent Outpatient Visits          3 weeks ago Diarrhea, unspecified type   [facility] Glean Hess, MD   5 months ago Urinary frequency   [facility] Glean Hess, MD   10 months ago Frequent urination   [facility] Juline Patch, MD   11 months ago Type II diabetes mellitus with complication [facility])   [facility] Glean Hess, MD   1 year ago Type II diabetes mellitus with complication [facility])   [facility] Glean Hess, MD      Future Appointments            In 2 weeks [place] Melia Jesse Sans, MD [facility]

## 2021-07-20 DIAGNOSIS — C61 Malignant neoplasm of prostate: Secondary | ICD-10-CM | POA: Diagnosis not present

## 2021-07-20 DIAGNOSIS — Z79899 Other long term (current) drug therapy: Secondary | ICD-10-CM | POA: Diagnosis not present

## 2021-07-20 DIAGNOSIS — Z79818 Long term (current) use of other agents affecting estrogen receptors and estrogen levels: Secondary | ICD-10-CM | POA: Diagnosis not present

## 2021-07-20 DIAGNOSIS — C772 Secondary and unspecified malignant neoplasm of intra-abdominal lymph nodes: Secondary | ICD-10-CM | POA: Diagnosis not present

## 2021-07-20 DIAGNOSIS — Z87891 Personal history of nicotine dependence: Secondary | ICD-10-CM | POA: Diagnosis not present

## 2021-07-22 DIAGNOSIS — S63502A Unspecified sprain of left wrist, initial encounter: Secondary | ICD-10-CM | POA: Diagnosis not present

## 2021-07-23 DIAGNOSIS — S6992XA Unspecified injury of left wrist, hand and finger(s), initial encounter: Secondary | ICD-10-CM | POA: Diagnosis not present

## 2021-07-24 ENCOUNTER — Other Ambulatory Visit: Payer: Self-pay | Admitting: Internal Medicine

## 2021-07-24 DIAGNOSIS — K219 Gastro-esophageal reflux disease without esophagitis: Secondary | ICD-10-CM

## 2021-07-24 NOTE — Telephone Encounter (Signed)
Requested Prescriptions  Pending Prescriptions Disp Refills  . omeprazole (PRILOSEC) 20 MG capsule [Pharmacy Med Name: OMEPRAZOLE DR 20 MG CAPSULE] 180 capsule 0    Sig: TAKE 1 CAPSULE (20 MG TOTAL) BY MOUTH 2 (TWO) TIMES DAILY BEFORE A MEAL.     Gastroenterology: Proton Pump Inhibitors Passed - 07/24/2021 12:46 AM      Passed - Valid encounter within last 12 months    Recent Outpatient Visits          1 month ago Diarrhea, unspecified type   [place], [place]   5 months ago Urinary frequency   [facility] Glean Hess, MD   10 months ago Frequent urination   [facility] Juline Patch, MD   11 months ago Type II diabetes mellitus with complication [facility])   [facility] Glean Hess, MD   1 year ago Type II diabetes mellitus with complication [facility] [address])   [facility] Glean Hess, MD      Future Appointments            In 1 week Army Melia Jesse Sans, MD [facility], [facility]

## 2021-07-26 DIAGNOSIS — S63502D Unspecified sprain of left wrist, subsequent encounter: Secondary | ICD-10-CM | POA: Diagnosis not present

## 2021-08-01 ENCOUNTER — Ambulatory Visit: Payer: Medicare Other | Admitting: Internal Medicine

## 2021-08-01 DIAGNOSIS — M5030 Other cervical disc degeneration, unspecified cervical region: Secondary | ICD-10-CM | POA: Diagnosis not present

## 2021-08-01 DIAGNOSIS — D696 Thrombocytopenia, unspecified: Secondary | ICD-10-CM | POA: Diagnosis not present

## 2021-08-01 DIAGNOSIS — M62838 Other muscle spasm: Secondary | ICD-10-CM | POA: Diagnosis not present

## 2021-08-04 ENCOUNTER — Ambulatory Visit: Payer: Medicare Other | Admitting: Internal Medicine

## 2021-08-09 DIAGNOSIS — M65332 Trigger finger, left middle finger: Secondary | ICD-10-CM | POA: Diagnosis not present

## 2021-08-22 ENCOUNTER — Ambulatory Visit: Payer: Self-pay | Admitting: *Deleted

## 2021-08-22 NOTE — Telephone Encounter (Signed)
Pt's spouse Gwinda Passe called in to schedule an apt for pt. Pt has been experiencing diarrhea and is tired. Pt isn't currently experiencing any other symptoms such as nausea, vomiting, etc.    Please assist pt further.   CB: [phone]    Chief Complaint: Diarrhea Symptoms: Diarrhea x 3 this AM Frequency: Onset this AM Pertinent Negatives: Patient denies pain, fever, dizziness, no S/S dehydration Disposition: [] ED /[] Urgent Care (no appt availability in office) / [] Appointment(In office/virtual)/ []  [place] [facility]/ [x] Home Care/ [] Refused Recommended Disposition  Additional Notes: Home care advise given per protocol pt verbalizes understanding.  Reason for Disposition  MILD-MODERATE diarrhea (e.g., 1-6 times / day more than normal)  Answer Assessment - Initial Assessment Questions 1. DIARRHEA SEVERITY: "How bad is the diarrhea?" "How many more stools have you had in the past 24 hours than normal?"    - NO DIARRHEA (SCALE 0)   - MILD (SCALE 1-3): Few loose or mushy BMs; increase of 1-3 stools over normal daily number of stools; mild increase in ostomy output.   -  MODERATE (SCALE 4-7): Increase of 4-6 stools daily over normal; moderate increase in ostomy output. * SEVERE (SCALE 8-10; OR 'WORST POSSIBLE'): Increase of 7 or more stools daily over normal; moderate increase in ostomy output; incontinence.     3 since 1110 this morning 2. ONSET: "When did the diarrhea begin?"      This AM 3. BM CONSISTENCY: "How loose or watery is the diarrhea?"      Loose 4. VOMITING: "Are you also vomiting?" If Yes, ask: "How many times in the past 24 hours?"      No 5. ABDOMINAL PAIN: "Are you having any abdominal pain?" If Yes, ask: "What does it feel like?" (e.g., crampy, dull, intermittent, constant)      No 6. ABDOMINAL PAIN SEVERITY: If present, ask: "How bad is the pain?"  (e.g., Scale 1-10; mild, moderate, or severe)   - MILD (1-3): doesn't interfere with normal activities, abdomen soft  and not tender to touch    - MODERATE (4-7): interferes with normal activities or awakens from sleep, abdomen tender to touch    - SEVERE (8-10): excruciating pain, doubled over, unable to do any normal activities       NA 7. ORAL INTAKE: If vomiting, "Have you been able to drink liquids?" "How much liquids have you had in the past 24 hours?"     18 oz 8. HYDRATION: "Any signs of dehydration?" (e.g., dry mouth [not just dry lips], too weak to stand, dizziness, new weight loss) "When did you last urinate?"     No 9. EXPOSURE: "Have you traveled to a foreign country recently?" "Have you been exposed to anyone with diarrhea?" "Could you have eaten any food that was spoiled?"     No 10. ANTIBIOTIC USE: "Are you taking antibiotics now or have you taken antibiotics in the past 2 months?"       No 11. OTHER SYMPTOMS: "Do you have any other symptoms?" (e.g., fever, blood in stool)       No  Protocols used: Diarrhea-A-AH

## 2021-10-08 DIAGNOSIS — Z20822 Contact with and (suspected) exposure to covid-19: Secondary | ICD-10-CM | POA: Diagnosis not present

## 2021-10-10 ENCOUNTER — Ambulatory Visit: Payer: Medicare Other | Admitting: Internal Medicine

## 2021-10-12 ENCOUNTER — Other Ambulatory Visit: Payer: Self-pay

## 2021-10-12 ENCOUNTER — Encounter: Payer: Self-pay | Admitting: Internal Medicine

## 2021-10-12 ENCOUNTER — Ambulatory Visit (INDEPENDENT_AMBULATORY_CARE_PROVIDER_SITE_OTHER): Payer: Medicare Other | Admitting: Internal Medicine

## 2021-10-12 VITALS — BP 98/50 | HR 96 | Ht 68.0 in | Wt 170.0 lb

## 2021-10-12 DIAGNOSIS — E118 Type 2 diabetes mellitus with unspecified complications: Secondary | ICD-10-CM

## 2021-10-12 DIAGNOSIS — D7589 Other specified diseases of blood and blood-forming organs: Secondary | ICD-10-CM

## 2021-10-12 DIAGNOSIS — I10 Essential (primary) hypertension: Secondary | ICD-10-CM

## 2021-10-12 DIAGNOSIS — E440 Moderate protein-calorie malnutrition: Secondary | ICD-10-CM

## 2021-10-12 DIAGNOSIS — C61 Malignant neoplasm of prostate: Secondary | ICD-10-CM

## 2021-10-12 DIAGNOSIS — E1169 Type 2 diabetes mellitus with other specified complication: Secondary | ICD-10-CM

## 2021-10-12 DIAGNOSIS — E785 Hyperlipidemia, unspecified: Secondary | ICD-10-CM | POA: Diagnosis not present

## 2021-10-12 LAB — POCT GLYCOSYLATED HEMOGLOBIN (HGB A1C): Hemoglobin A1C: 5.6 % (ref 4.0–5.6)

## 2021-10-12 NOTE — Patient Instructions (Addendum)
Team Member Role and Specialty Contact Info Address Start End Comments  Roseanne Kaufman, MD Consulting Physician (Orthopedic Surgery) Phone: [phone] Fax: [phone]  [address] [place]  [address] 10/12/2021 - -   Reduce metformin to once a day in the AM  Drink Ensure or Boost twice a day.

## 2021-10-12 NOTE — Progress Notes (Signed)
Date:  10/12/2021   Name:  Thomas Allen   DOB:  [birth year]   MRN:  [id number]   Chief Complaint: Diabetes  Diabetes He presents for his follow-up diabetic visit. He has type 2 diabetes mellitus. His disease course has been stable. Pertinent negatives for hypoglycemia include no dizziness, hunger, mood changes, nervousness/anxiousness, sweats or tremors. Associated symptoms include weight loss. Pertinent negatives for diabetes include no chest pain and no fatigue. Symptoms are stable. There are no diabetic complications. Current diabetic treatment includes oral agent (monotherapy) (metformin 1000 mg bid). He is compliant with treatment all of the time. His weight is decreasing steadily. Diabetic current diet: poor appetite, not eating much. An ACE inhibitor/angiotensin II receptor blocker is being taken.  Hypertension This is a chronic problem. The problem is controlled. Pertinent negatives include no chest pain, shortness of breath or sweats.  Prostate cancer - he is on immunotherapy and has very poor appetite.  He continues to lose weight.  He is not using a nutritional supplement.  Last PET was 07/2020 showing local recurrence. He has lost 35 lbs in the past year or 17% of his body weight.  Lab Results  Component Value Date   NA 132 (L) 06/22/2021   K 4.4 06/22/2021   CO2 17 (L) 06/22/2021   GLUCOSE 81 06/22/2021   BUN 26 06/22/2021   CREATININE 1.33 (H) 06/22/2021   CALCIUM 10.0 06/22/2021   EGFR 54 (L) 06/22/2021   GFRNONAA 58 (L) 01/05/2021   Lab Results  Component Value Date   CHOL 166 12/23/2019   HDL 44 12/23/2019   LDLCALC 101 (H) 12/23/2019   TRIG 115 12/23/2019   CHOLHDL 3.8 12/23/2019   Lab Results  Component Value Date   TSH 2.040 02/05/2017   Lab Results  Component Value Date   HGBA1C 5.6 10/12/2021   Lab Results  Component Value Date   WBC 8.2 06/22/2021   HGB 13.6 06/22/2021   HCT 40.4 06/22/2021   MCV 101 (H) 06/22/2021   PLT 234 06/22/2021   Lab  Results  Component Value Date   ALT 13 01/05/2021   AST 17 01/05/2021   ALKPHOS 58 01/05/2021   BILITOT 1.5 (H) 01/05/2021   No results found for: 25OHVITD2, 25OHVITD3, VD25OH   Review of Systems  Constitutional:  Positive for appetite change, unexpected weight change and weight loss. Negative for diaphoresis, fatigue and fever.  HENT:  Negative for trouble swallowing.   Respiratory:  Negative for chest tightness and shortness of breath.   Cardiovascular:  Negative for chest pain.  Gastrointestinal:  Negative for abdominal pain, blood in stool and constipation.  Genitourinary:  Negative for dysuria and hematuria.  Neurological:  Negative for dizziness and tremors.  Psychiatric/Behavioral:  Negative for dysphoric mood. The patient is not nervous/anxious.    Patient Active Problem List   Diagnosis Date Noted   Low back strain, initial encounter 01/28/2021   Hand pain, right 08/23/2017   Erectile dysfunction following radiation therapy 06/26/2017   Urinary frequency 06/18/2017   Hyperlipidemia associated with type 2 diabetes mellitus (HCC) 03/12/2017   Asymptomatic PVCs 02/05/2017   Gastroesophageal reflux disease 02/05/2017   Alcohol use disorder 01/19/2016   Type II diabetes mellitus with complication (HCC) 07/26/2015   Carpal tunnel syndrome 03/30/2015   Elevated blood pressure, situational 03/30/2015   Abnormal LFTs 03/30/2015   H/O adenomatous polyp of colon 03/30/2015   CA of prostate (HCC) 03/30/2015   Tobacco use disorder, moderate, in sustained remission  03/30/2015   Prostate cancer ([place]) 09/02/2013   Posterior calcaneal exostosis 02/26/2013   Calcific Achilles tendinitis 02/26/2013    Allergies  Allergen Reactions   Ace Inhibitors Cough    Past Surgical History:  Procedure Laterality Date   APPENDECTOMY     COLONOSCOPY  2008   benign polyps   COLONOSCOPY WITH PROPOFOL N/A 08/07/2017   Procedure: COLONOSCOPY WITH PROPOFOL;  Surgeon: Lollie Sails, MD;   Location: [facility] ENDOSCOPY;  Service: Endoscopy;  Laterality: N/A;   COLONOSCOPY WITH PROPOFOL N/A 12/03/2020   Procedure: COLONOSCOPY WITH PROPOFOL;  Surgeon: Lesly Rubenstein, MD;  Location: [facility] ENDOSCOPY;  Service: Endoscopy;  Laterality: N/A;   ESOPHAGOGASTRODUODENOSCOPY (EGD) WITH PROPOFOL N/A 08/07/2017   Procedure: ESOPHAGOGASTRODUODENOSCOPY (EGD) WITH PROPOFOL;  Surgeon: Lollie Sails, MD;  Location: [facility] END[facility]COPY;  Service: Endoscopy;  Laterality: N/A;   JOINT REPLACEMENT Right 2009   JOINT REPLACEMENT Left 2015   lithopexy     PROSTATE BIOPSY  2012   malignant   REPLACEMENT TOTAL KNEE Bilateral     Social History   Tobacco Use   Smoking status: Former    Types: Cigarettes    Quit date: 1976    Years since quitting: 47.1   Smokeless tobacco: Never  Vaping Use   Vaping Use: Never used  Substance Use Topics   Alcohol use: Yes    Alcohol/week: 14.0 standard drinks    Types: 14 Shots of liquor per week    Comment: 2 scotch drinks per night   Drug use: No     Medication list has been reviewed and updated.  Current Meds  Medication Sig   enzalutamide (XTANDI) 40 MG tablet Take 80 mg by mouth daily.   metFORMIN (GLUCOPHAGE) 1000 MG tablet TAKE 1 TABLET BY MOUTH TWICE A DAY   Multiple Vitamins-Minerals (MULTIVITAMIN WITH MINERALS) tablet Take 1 tablet by mouth daily.   omeprazole (PRILOSEC) 20 MG capsule TAKE 1 CAPSULE (20 MG TOTAL) BY MOUTH 2 (TWO) TIMES DAILY BEFORE A MEAL.    PHQ 2/9 Scores 10/12/2021 01/28/2021 12/13/2020 08/16/2020  PHQ - 2 Score 0 0 0 0  PHQ- 9 Score 0 2 - 0    GAD 7 : Generalized Anxiety Score 10/12/2021 01/28/2021 08/16/2020 12/23/2019  Nervous, Anxious, on Edge 0 0 0 0  Control/stop worrying 0 0 0 0  Worry too much - different things 0 0 0 0  Trouble relaxing 0 0 0 0  Restless 0 0 0 0  Easily annoyed or irritable 0 0 0 0  Afraid - awful might happen 0 0 0 0  Total GAD 7 Score 0 0 0 0  Anxiety Difficulty - Not difficult at all - Not  difficult at all    BP Readings from Last 3 Encounters:  10/12/21 (!) 98/50  06/21/21 128/78  02/28/21 111/73    Physical Exam Vitals and nursing note reviewed.  Constitutional:      General: He is not in acute distress.    Appearance: Normal appearance. He is well-developed.  HENT:     Head: Normocephalic and atraumatic.  Cardiovascular:     Rate and Rhythm: Normal rate and regular rhythm.  Pulmonary:     Effort: Pulmonary effort is normal. No respiratory distress.     Breath sounds: No wheezing or rhonchi.  Abdominal:     General: Abdomen is flat.     Palpations: Abdomen is soft.     Tenderness: There is no abdominal tenderness. There is no guarding or  rebound.  Musculoskeletal:     Cervical back: Normal range of motion.     Right lower leg: No edema.     Left lower leg: No edema.  Lymphadenopathy:     Cervical: No cervical adenopathy.  Skin:    General: Skin is warm and dry.     Capillary Refill: Capillary refill takes less than 2 seconds.     Findings: No rash.  Neurological:     General: No focal deficit present.     Mental Status: He is alert and oriented to person, place, and time.  Psychiatric:        Mood and Affect: Mood normal.        Behavior: Behavior normal.    Wt Readings from Last 3 Encounters:  10/12/21 170 lb (77.1 kg)  06/21/21 173 lb 3.2 oz (78.6 kg)  02/28/21 184 lb 15.5 oz (83.9 kg)    BP (!) 98/50    Pulse 96    Ht $R'5\' 8"'sg$  (1.727 m)    Wt 170 lb (77.1 kg)    SpO2 97%    BMI 25.85 kg/m   Assessment and Plan: 1. Essential hypertension Clinically stable exam with well controlled BP.  ON losartan primarily for renal protection. Tolerating medications without side effects at this time. Pt to continue current regimen and low sodium diet; benefits of regular exercise as able discussed.   2. Type II diabetes mellitus with complication (HCC) A3T and weight continue to decrease.   No hypoglycemic sx but recommend reducing metformin to once a  day. - POCT HgB A1C - Microalbumin / creatinine urine ratio - Comprehensive metabolic panel  3. Hyperlipidemia associated with type 2 diabetes mellitus ([place]) Not currently on statin. - Lipid panel  4. Prostate cancer ([place]) Under active treatment with Xtandi with side effect of decrease appetite. - PSA  5. Macrocytosis without anemia Monitor CBC - CBC with Differential/Platelet  6. Moderate protein-calorie malnutrition ([place]) Will check B12 and advise Begin nutritional supplements with Ensure or Boost twice a day Follow up in 3 months. - Vitamin B12   Partially dictated using Editor, commissioning. Any errors are unintentional.  Halina Maidens, MD [facility]  10/12/2021

## 2021-10-13 LAB — CBC WITH DIFFERENTIAL/PLATELET
Basophils Absolute: 0 10*3/uL (ref 0.0–0.2)
Basos: 0 %
EOS (ABSOLUTE): 0.2 10*3/uL (ref 0.0–0.4)
Eos: 2 %
Hematocrit: 36.8 % — ABNORMAL LOW (ref 37.5–51.0)
Hemoglobin: 12.2 g/dL — ABNORMAL LOW (ref 13.0–17.7)
Immature Grans (Abs): 0 10*3/uL (ref 0.0–0.1)
Immature Granulocytes: 0 %
Lymphocytes Absolute: 1.2 10*3/uL (ref 0.7–3.1)
Lymphs: 16 %
MCH: 34.3 pg — ABNORMAL HIGH (ref 26.6–33.0)
MCHC: 33.2 g/dL (ref 31.5–35.7)
MCV: 103 fL — ABNORMAL HIGH (ref 79–97)
Monocytes Absolute: 0.7 10*3/uL (ref 0.1–0.9)
Monocytes: 10 %
Neutrophils Absolute: 5.2 10*3/uL (ref 1.4–7.0)
Neutrophils: 72 %
Platelets: 251 10*3/uL (ref 150–450)
RBC: 3.56 x10E6/uL — ABNORMAL LOW (ref 4.14–5.80)
RDW: 11.7 % (ref 11.6–15.4)
WBC: 7.3 10*3/uL (ref 3.4–10.8)

## 2021-10-13 LAB — COMPREHENSIVE METABOLIC PANEL
ALT: 7 IU/L (ref 0–44)
AST: 6 IU/L (ref 0–40)
Albumin/Globulin Ratio: 1.8 (ref 1.2–2.2)
Albumin: 4.2 g/dL (ref 3.7–4.7)
Alkaline Phosphatase: 58 IU/L (ref 44–121)
BUN/Creatinine Ratio: 14 (ref 10–24)
BUN: 13 mg/dL (ref 8–27)
Bilirubin Total: 0.3 mg/dL (ref 0.0–1.2)
CO2: 22 mmol/L (ref 20–29)
Calcium: 9.6 mg/dL (ref 8.6–10.2)
Chloride: 94 mmol/L — ABNORMAL LOW (ref 96–106)
Creatinine, Ser: 0.92 mg/dL (ref 0.76–1.27)
Globulin, Total: 2.3 g/dL (ref 1.5–4.5)
Glucose: 107 mg/dL — ABNORMAL HIGH (ref 70–99)
Potassium: 5 mmol/L (ref 3.5–5.2)
Sodium: 131 mmol/L — ABNORMAL LOW (ref 134–144)
Total Protein: 6.5 g/dL (ref 6.0–8.5)
eGFR: 85 mL/min/{1.73_m2} (ref >59)

## 2021-10-13 LAB — VITAMIN B12: Vitamin B-12: 138 pg/mL — ABNORMAL LOW (ref 232–1245)

## 2021-10-13 LAB — MICROALBUMIN / CREATININE URINE RATIO
Creatinine, Urine: 39.5 mg/dL
Microalb/Creat Ratio: <8 mg/g creat (ref 0–29)
Microalbumin, Urine: <3 ug/mL

## 2021-10-13 LAB — LIPID PANEL
Chol/HDL Ratio: 3.7 ratio (ref 0.0–5.0)
Cholesterol, Total: 166 mg/dL (ref 100–199)
HDL: 45 mg/dL (ref >39)
LDL Chol Calc (NIH): 91 mg/dL (ref 0–99)
Triglycerides: 175 mg/dL — ABNORMAL HIGH (ref 0–149)
VLDL Cholesterol Cal: 30 mg/dL (ref 5–40)

## 2021-10-13 LAB — PSA: Prostate Specific Ag, Serum: <0.1 ng/mL (ref 0.0–4.0)

## 2021-10-16 ENCOUNTER — Other Ambulatory Visit: Payer: Self-pay | Admitting: Internal Medicine

## 2021-10-16 DIAGNOSIS — E119 Type 2 diabetes mellitus without complications: Secondary | ICD-10-CM

## 2021-10-17 NOTE — Telephone Encounter (Signed)
Requested Prescriptions  Pending Prescriptions Disp Refills   metFORMIN (GLUCOPHAGE) 1000 MG tablet [Pharmacy Med Name: METFORMIN HCL 1,000 MG TABLET] 180 tablet 0    Sig: TAKE 1 TABLET BY MOUTH TWICE A DAY     Endocrinology:  Diabetes - Biguanides Failed - 10/16/2021 12:52 AM      Failed - B12 Level in normal range and within 720 days    Vitamin B-12  Date Value Ref Range Status  10/12/2021 138 (L) 232 - 1,245 pg/mL Final         Failed - CBC within normal limits and completed in the last 12 months    WBC  Date Value Ref Range Status  10/12/2021 7.3 3.4 - 10.8 x10E3/uL Final  01/05/2021 8.1 4.0 - 10.5 K/uL Final   RBC  Date Value Ref Range Status  10/12/2021 3.56 (L) 4.14 - 5.80 x10E6/uL Final  01/05/2021 3.99 (L) 4.22 - 5.81 MIL/uL Final   Hemoglobin  Date Value Ref Range Status  10/12/2021 12.2 (L) 13.0 - 17.7 g/dL Final   Hematocrit  Date Value Ref Range Status  10/12/2021 36.8 (L) 37.5 - 51.0 % Final   MCHC  Date Value Ref Range Status  10/12/2021 33.2 31.5 - 35.7 g/dL Final  01/05/2021 33.6 30.0 - 36.0 g/dL Final   [facility]  Date Value Ref Range Status  10/12/2021 34.3 (H) 26.6 - 33.0 pg Final  01/05/2021 33.1 26.0 - 34.0 pg Final   MCV  Date Value Ref Range Status  10/12/2021 103 (H) 79 - 97 fL Final  08/24/2013 98 80 - 100 fL Final   No results found for: PLTCOUNTKUC, LABPLAT, POCPLA RDW  Date Value Ref Range Status  10/12/2021 11.7 11.6 - 15.4 % Final  08/24/2013 12.9 11.5 - 14.5 % Final         Passed - Cr in normal range and within 360 days    Creatinine  Date Value Ref Range Status  08/24/2013 0.76 0.60 - 1.30 mg/dL Final   Creatinine, Ser  Date Value Ref Range Status  10/12/2021 0.92 0.76 - 1.27 mg/dL Final         Passed - HBA1C is between 0 and 7.9 and within 180 days    Hemoglobin A1C  Date Value Ref Range Status  10/12/2021 5.6 4.0 - 5.6 % Corrected  01/19/2021 5.9  Final         Passed - eGFR in normal range and within 360 days     EGFR (African American)  Date Value Ref Range Status  08/24/2013 >60  Final   GFR calc Af Amer  Date Value Ref Range Status  08/16/2020 84 >59 mL/min/1.73 Final    Comment:    **In accordance with recommendations from the NKF-ASN Task force,**   [facility] is in the process of updating its eGFR calculation to the   2021 CKD-EPI creatinine equation that estimates kidney function   without a race variable.    EGFR (Non-African Amer.)  Date Value Ref Range Status  08/24/2013 >60  Final    Comment:    eGFR values <74mL/min/1.73 m2 may be an indication of chronic kidney disease (CKD). Calculated eGFR is useful in patients with stable renal function. The eGFR calculation will not be reliable in acutely ill patients when serum creatinine is changing rapidly. It is not useful in  patients on dialysis. The eGFR calculation may not be applicable to patients at the low and high extremes of body sizes, pregnant women, and vegetarians. POTASSIUM -  Slight hemolysis, interpret results with  - caution.    GFR, Estimated  Date Value Ref Range Status  01/05/2021 58 (L) >60 mL/min Final    Comment:    (NOTE) Calculated using the CKD-EPI Creatinine Equation (2021)    eGFR  Date Value Ref Range Status  10/12/2021 85 >59 mL/min/1.73 Final         Passed - Valid encounter within last 6 months    Recent Outpatient Visits          5 days ago Essential hypertension   [facility] Glean Hess, MD   3 months ago Diarrhea, unspecified type   [facility] Glean Hess, MD   8 months ago Urinary frequency   [facility] Glean Hess, MD   1 year ago Frequent urination   [facility] Juline Patch, MD   1 year ago Type II diabetes mellitus with complication [facility])   [facility] Glean Hess, MD      Future Appointments            In 2 months Army Melia Jesse Sans, MD [facility]

## 2021-11-07 DIAGNOSIS — C772 Secondary and unspecified malignant neoplasm of intra-abdominal lymph nodes: Secondary | ICD-10-CM | POA: Diagnosis not present

## 2021-11-07 DIAGNOSIS — Z79899 Other long term (current) drug therapy: Secondary | ICD-10-CM | POA: Diagnosis not present

## 2021-11-07 DIAGNOSIS — C61 Malignant neoplasm of prostate: Secondary | ICD-10-CM | POA: Diagnosis not present

## 2021-11-08 ENCOUNTER — Ambulatory Visit: Payer: Self-pay | Admitting: *Deleted

## 2021-11-08 NOTE — Telephone Encounter (Signed)
? ?  Chief Complaint: low bp ?Symptoms: none ?Frequency: unsure ?Pertinent Negatives: Patient denies na ?Disposition: '[]'$ ED /'[]'$ Urgent Care (no appt availability in office) / '[]'$ Appointment(In office/virtual)/ '[]'$  [facility]/ '[]'$ Home Care/ '[]'$ Refused Recommended Disposition /'[]'$ [facility]/ '[x]'$  Follow-up with PCP ?Additional Notes: Pt has been seen by 2 doctors this week, BP low both times. He would like to know if he can come off Losartan. Also, taking OTC Vit B since he is on Xtandi. He would like to go on Vitamin B12 injections. ?Answer Assessment - Initial Assessment Questions ?1. BLOOD PRESSURE: "What is the blood pressure?" "Did you take at least two measurements 5 minutes apart?" ?   99/51, 101/51 ?2. ONSET: "When did you take your blood pressure?" ?    Doctors office ?3. HOW: "How did you obtain the blood pressure?" (e.g., visiting nurse, automatic home BP monitor) ?    cuff ?4. HISTORY: "Do you have a history of low blood pressure?" "What is your blood pressure normally?" ?    No, on Losartan ?5. MEDICATIONS: "Are you taking any medications for blood pressure?" If Yes, ask: "Have they been changed recently?" ?    yes ?6. PULSE RATE: "Do you know what your pulse rate is?"  ?    no ?7. OTHER SYMPTOMS: "Have you been sick recently?" "Have you had a recent injury?" ?    no ?8. PREGNANCY: "Is there any chance you are pregnant?" "When was your last menstrual period?" ?    Na ? ?Protocols used: Blood Pressure - Low-A-AH ? ?

## 2021-11-09 ENCOUNTER — Other Ambulatory Visit: Payer: Self-pay | Admitting: Internal Medicine

## 2021-11-09 NOTE — Telephone Encounter (Signed)
Informed pt to stop losartan and record BP readings. ? ?KP ?

## 2021-11-09 NOTE — Telephone Encounter (Signed)
Please review.  KP

## 2021-11-16 IMAGING — CT CT HEAD W/O CM
4 series · 16 of 47 positions shown, 18 images · non-contrast
Comparison: None.

CLINICAL DATA: Head and neck trauma, pain, injury

EXAM:
CT HEAD WITHOUT CONTRAST
CT CERVICAL SPINE WITHOUT CONTRAST
TECHNIQUE: Multidetector CT imaging of the head and cervical spine was
performed following the standard protocol without intravenous
contrast. Multiplanar CT image reconstructions of the cervical spine
were also generated.

[Series 2: head bone · axial · 0.46mm/px · z∈[+741,+771]mm · 3 of 75 slices shown]
[im 8/75  bone]
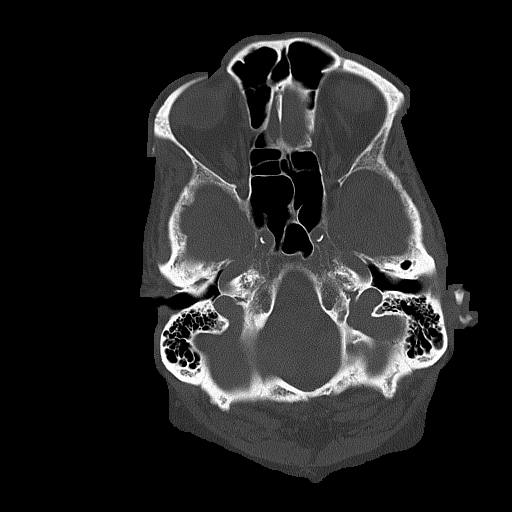
[im 15/75  bone]
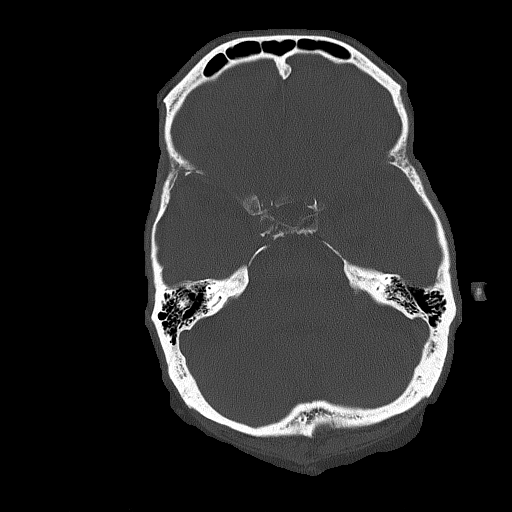
[im 23/75  bone]
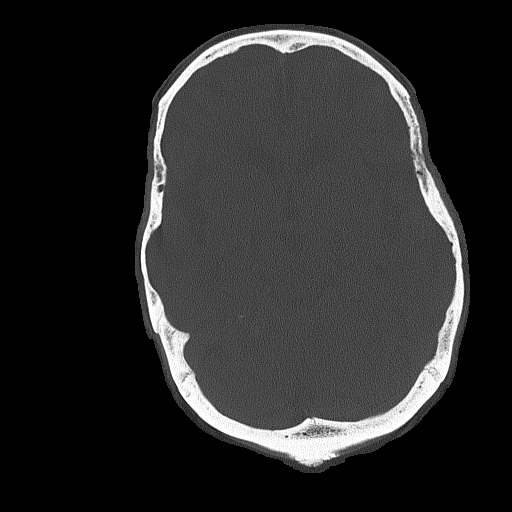

[Series 3: head wo · axial · 0.46mm/px · z∈[+742,+852]mm · 7 of 30 slices shown, 9 images]
[im 4/30  brain]
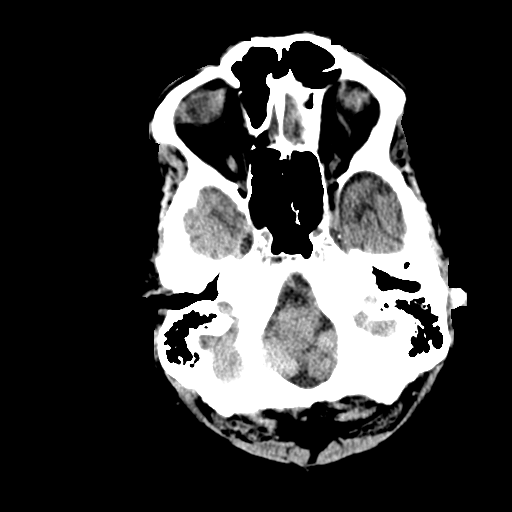
[im 4/30  bone]
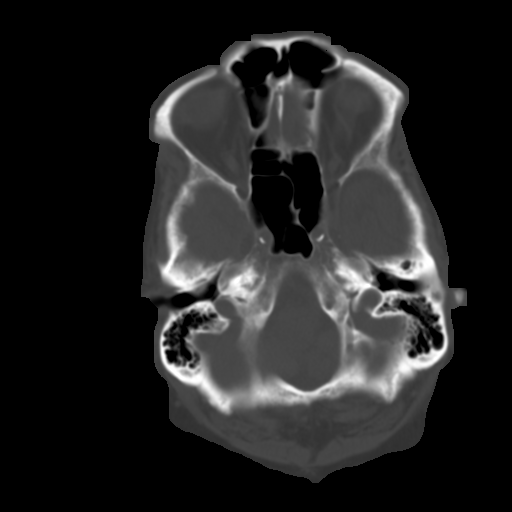
[im 8/30  brain]
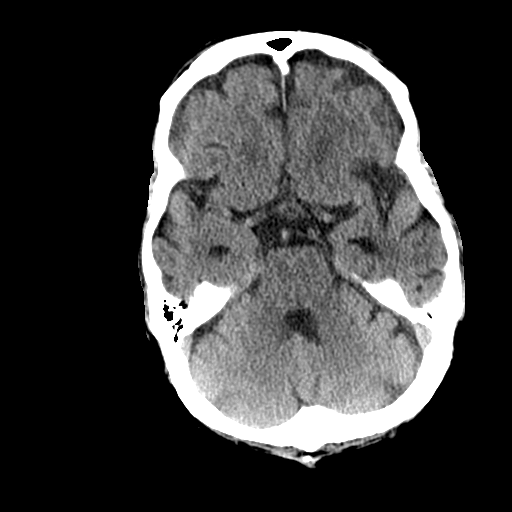
[im 11/30  brain]
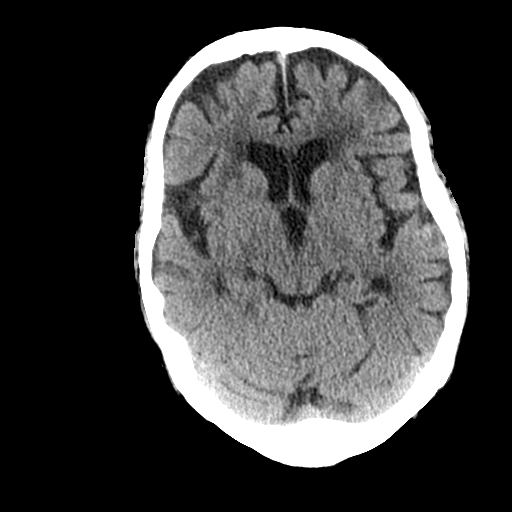
[im 15/30  brain]
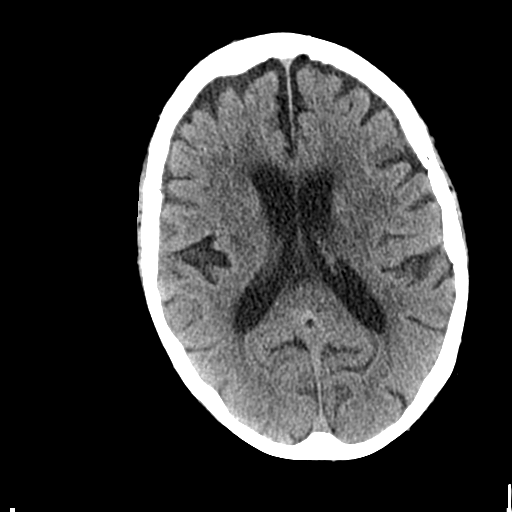
[im 19/30  brain]
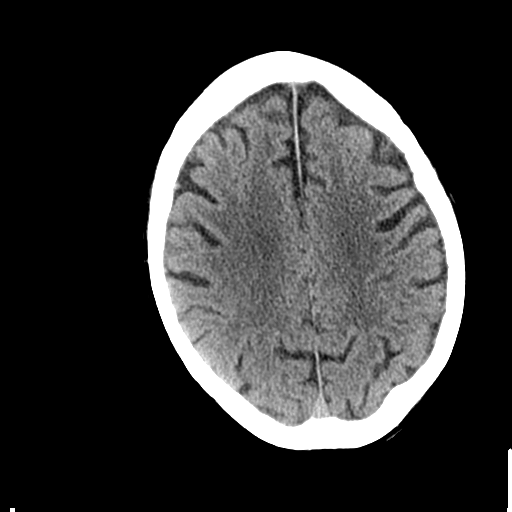
[im 19/30  bone]
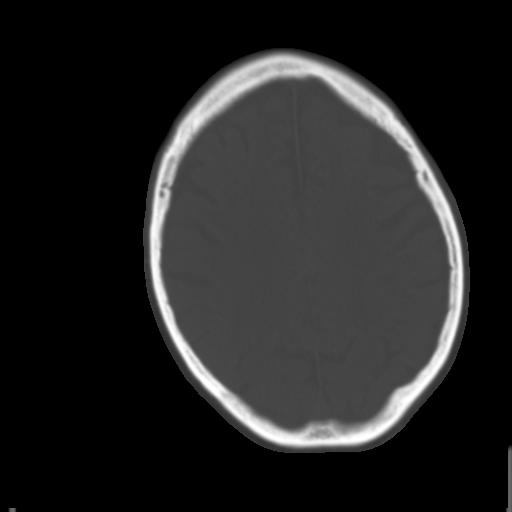
[im 22/30  brain]
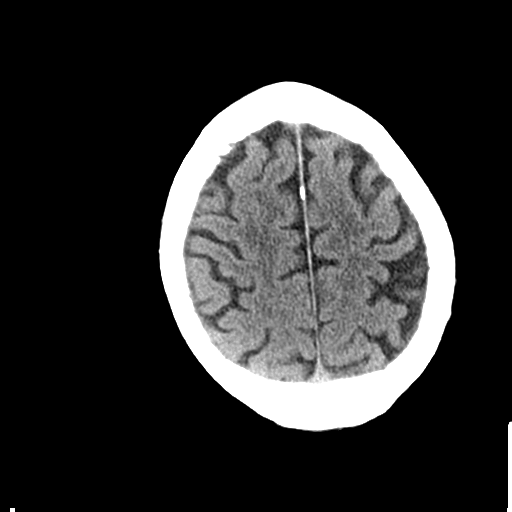
[im 26/30  brain]
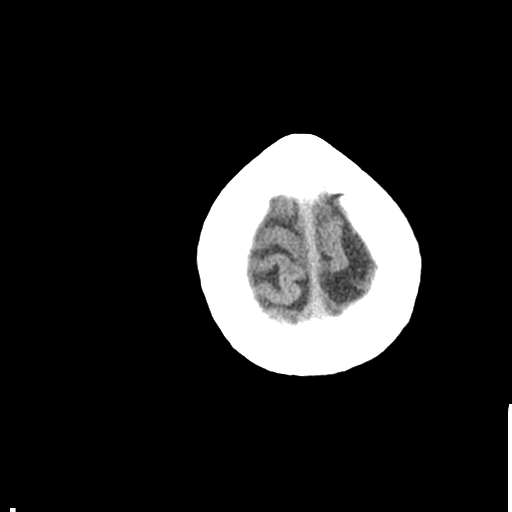

[Series 4: coronal soft tissue · coronal · 0.31mm/px · 3 of 72 slices shown]
[im 24/72  brain]
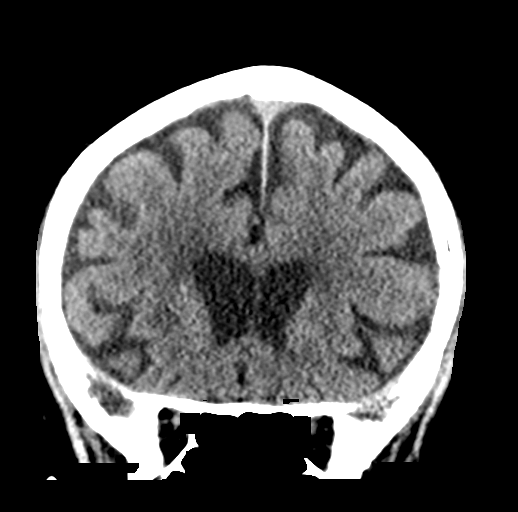
[im 32/72  brain]
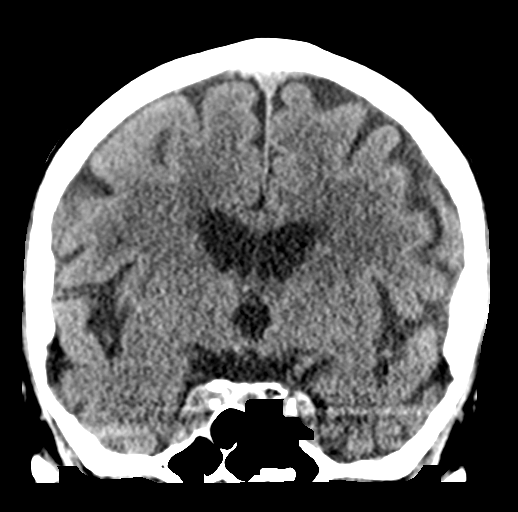
[im 40/72  brain]
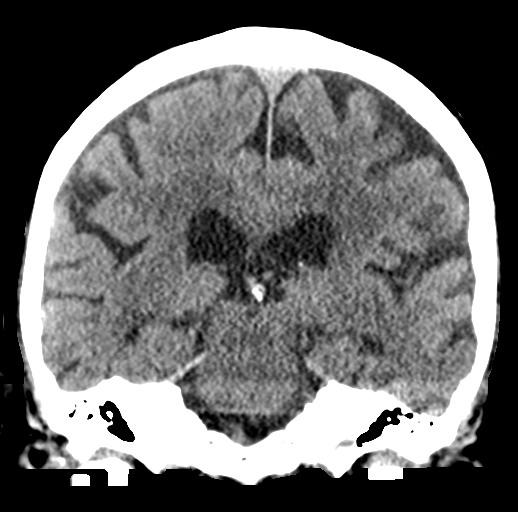

[Series 5: sagittal soft tissue · sagittal · 0.31mm/px · 3 of 54 slices shown]
[im 18/54  brain]
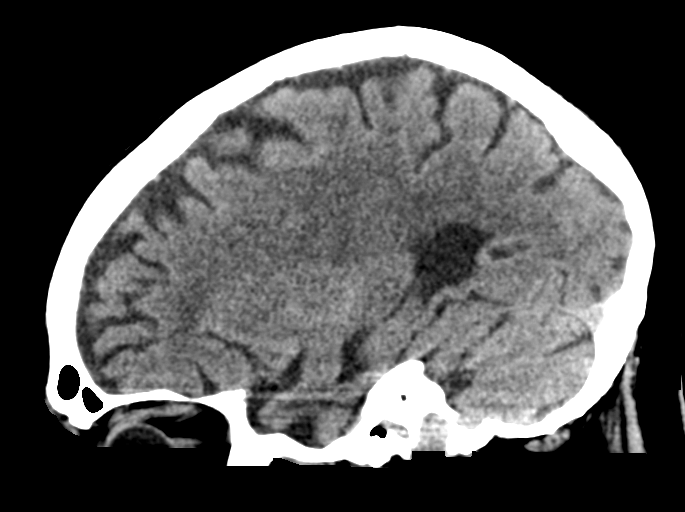
[im 27/54  brain]
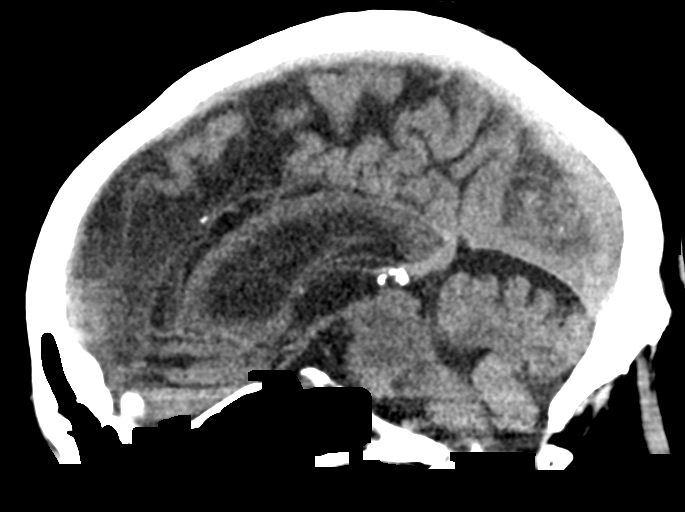
[im 36/54  brain]
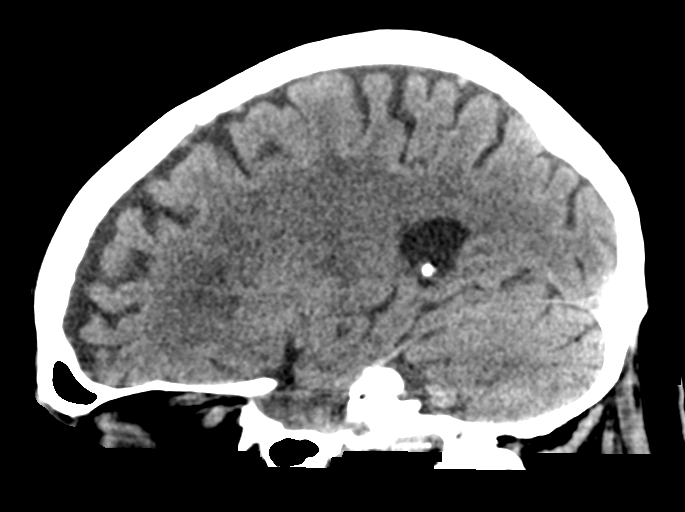

[16 of 47 positions shown; findings below may reference images not displayed]

FINDINGS: CT HEAD FINDINGS

Brain: Diffuse brain atrophy pattern and white matter microvascular
ischemic changes throughout both cerebral hemispheres. No acute
intracranial hemorrhage, definite new infarction, mass lesion,
midline shift, herniation, hydrocephalus, or extra-axial fluid
collection. No focal mass effect or edema. Cisterns are patent.
Cerebellar atrophy as well.

Vascular: Intracranial atherosclerosis at the skull base. No
hyperdense vessel.

Skull: Normal. Negative for fracture or focal lesion.

Sinuses/Orbits: No acute finding.

Other: None.

CT CERVICAL SPINE FINDINGS

Alignment: Mild cervical kyphotic curvature centered at C4-5,
secondary to advanced cervical spondylosis and anterior fused
osteophytes.

Skull base and vertebrae: No acute fracture. No primary bone lesion
or focal pathologic process.

Soft tissues and spinal canal: No prevertebral fluid or swelling. No
visible canal hematoma.

Disc levels: Advanced cervical degenerative disc disease at all
visualized levels with disc space narrowing, sclerosis, and large
anterior osteophytes some of which are fused. Degenerative changes
of C1-2 articulation as well.

Upper chest: Upper thoracic and sternoclavicular joint degenerative
changes. Atherosclerosis present. Clear lung apices.

Other: None.
IMPRESSION: Brain atrophy and chronic white matter microvascular ischemic
changes. No acute intracranial abnormality by noncontrast CT.

Advanced cervical spondylosis affecting all levels with associated
cervical kyphotic curvature centered at C4-5.

No definite acute cervical spine fracture by CT.

## 2021-11-18 ENCOUNTER — Other Ambulatory Visit: Payer: Self-pay | Admitting: Internal Medicine

## 2021-11-18 DIAGNOSIS — E118 Type 2 diabetes mellitus with unspecified complications: Secondary | ICD-10-CM

## 2021-11-18 NOTE — Telephone Encounter (Signed)
Requested medication (s) are due for refill today: yes ? ?Requested medication (s) are on the active medication list: yes ? ?Last refill:  08/12/21 ? ?Future visit scheduled: yes ? ?Notes to clinic:  historical med. Please advise ? ?  ?Requested Prescriptions  ?Pending Prescriptions Disp Refills  ? losartan (COZAAR) 25 MG tablet [Pharmacy Med Name: LOSARTAN POTASSIUM 25 MG TAB] 90 tablet 1  ?  Sig: TAKE 1 TABLET BY MOUTH EVERY DAY  ?  ? Cardiovascular:  Angiotensin Receptor Blockers Passed - 11/18/2021  2:22 AM  ?  ?  Passed - Cr in normal range and within 180 days  ?  Creatinine  ?Date Value Ref Range Status  ?08/24/2013 0.76 0.60 - 1.30 mg/dL Final  ? ?Creatinine, Ser  ?Date Value Ref Range Status  ?10/12/2021 0.92 0.76 - 1.27 mg/dL Final  ?  ?  ?  ?  Passed - K in normal range and within 180 days  ?  Potassium  ?Date Value Ref Range Status  ?10/12/2021 5.0 3.5 - 5.2 mmol/L Final  ?08/24/2013 3.5 3.5 - 5.1 mmol/L Final  ?  ?  ?  ?  Passed - Patient is not pregnant  ?  ?  Passed - Last BP in normal range  ?  BP Readings from Last 1 Encounters:  ?10/12/21 (!) 98/50  ?  ?  ?  ?  Passed - Valid encounter within last 6 months  ?  Recent Outpatient Visits   ? ?      ? 1 month ago Essential hypertension  ? [facility] Glean Hess, MD  ? 5 months ago Diarrhea, unspecified type  ? [facility] Glean Hess, MD  ? 9 months ago Urinary frequency  ? [facility] Glean Hess, MD  ? 1 year ago Frequent urination  ? [facility] Juline Patch, MD  ? 1 year ago Type II diabetes mellitus with complication [facility])  ? [facility] Glean Hess, MD  ? ?  ?  ?Future Appointments   ? ?        ? In 1 month Army Melia Jesse Sans, MD [facility] [place]  ? ?  ? ?  ?  ?  ? ?

## 2021-12-14 ENCOUNTER — Ambulatory Visit: Payer: Medicare Other

## 2021-12-21 ENCOUNTER — Ambulatory Visit (INDEPENDENT_AMBULATORY_CARE_PROVIDER_SITE_OTHER): Payer: Medicare Other

## 2021-12-21 DIAGNOSIS — Z0000 Encounter for general adult medical examination without abnormal findings: Secondary | ICD-10-CM | POA: Diagnosis not present

## 2021-12-21 NOTE — Patient Instructions (Signed)
Thomas Allen , ?Thank you for taking time to come for your Medicare Wellness Visit. I appreciate your ongoing commitment to your health goals. Please review the following plan we discussed and let me know if I can assist you in the future.  ? ?Screening recommendations/referrals: ?Colonoscopy: done 12/03/20. Repeat 12/2023 ?Recommended yearly ophthalmology/optometry visit for glaucoma screening and checkup ?Recommended yearly dental visit for hygiene and checkup ? ?Vaccinations: ?Influenza vaccine: declined ?Pneumococcal vaccine: declined ?Tdap vaccine: due ?Shingles vaccine: Shingrix discussed. Please contact your pharmacy for coverage information.  ?Covid-19:  done 11/24/19 & 12/17/19 ? ?Conditions/risks identified: Keep up the great work! ? ?Next appointment: Follow up in one year for your annual wellness visit.  ? ?Preventive Care 80 Years and Older, Male ?Preventive care refers to lifestyle choices and visits with your health care provider that can promote health and wellness. ?What does preventive care include? ?A yearly physical exam. This is also called an annual well check. ?Dental exams once or twice a year. ?Routine eye exams. Ask your health care provider how often you should have your eyes checked. ?Personal lifestyle choices, including: ?Daily care of your teeth and gums. ?Regular physical activity. ?Eating a healthy diet. ?Avoiding tobacco and drug use. ?Limiting alcohol use. ?Practicing safe sex. ?Taking low doses of aspirin every day. ?Taking vitamin and mineral supplements as recommended by your health care provider. ?What happens during an annual well check? ?The services and screenings done by your health care provider during your annual well check will depend on your age, overall health, lifestyle risk factors, and family history of disease. ?Counseling  ?Your health care provider may ask you questions about your: ?Alcohol use. ?Tobacco use. ?Drug use. ?Emotional well-being. ?Home and relationship  well-being. ?Sexual activity. ?Eating habits. ?History of falls. ?Memory and ability to understand (cognition). ?Work and work Statistician. ?Screening  ?You may have the following tests or measurements: ?Height, weight, and BMI. ?Blood pressure. ?Lipid and cholesterol levels. These may be checked every 5 years, or more frequently if you are over 72 years old. ?Skin check. ?Lung cancer screening. You may have this screening every year starting at age 70 if you have a 30-pack-year history of smoking and currently smoke or have quit within the past 15 years. ?Fecal occult blood test (FOBT) of the stool. You may have this test every year starting at age 38. ?Flexible sigmoidoscopy or colonoscopy. You may have a sigmoidoscopy every 5 years or a colonoscopy every 10 years starting at age 12. ?Prostate cancer screening. Recommendations will vary depending on your family history and other risks. ?Hepatitis C blood test. ?Hepatitis B blood test. ?Sexually transmitted disease (STD) testing. ?Diabetes screening. This is done by checking your blood sugar (glucose) after you have not eaten for a while (fasting). You may have this done every 1-3 years. ?Abdominal aortic aneurysm (AAA) screening. You may need this if you are a current or former smoker. ?Osteoporosis. You may be screened starting at age 24 if you are at high risk. ?Talk with your health care provider about your test results, treatment options, and if necessary, the need for more tests. ?Vaccines  ?Your health care provider may recommend certain vaccines, such as: ?Influenza vaccine. This is recommended every year. ?Tetanus, diphtheria, and acellular pertussis (Tdap, Td) vaccine. You may need a Td booster every 10 years. ?Zoster vaccine. You may need this after age 33. ?Pneumococcal 13-valent conjugate (PCV13) vaccine. One dose is recommended after age 6. ?Pneumococcal polysaccharide (PPSV23) vaccine. One dose is recommended after  age 51. ?Talk to your health care  provider about which screenings and vaccines you need and how often you need them. ?This information is not intended to replace advice given to you by your health care provider. Make sure you discuss any questions you have with your health care provider. ?Document Released: 09/17/2015 Document Revised: 05/10/2016 Document Reviewed: 06/22/2015 ?Elsevier Interactive Patient Education ? 2017 [place]. ? ?Fall Prevention in the Home ?Falls can cause injuries. They can happen to people of all ages. There are many things you can do to make your home safe and to help prevent falls. ?What can I do on the outside of my home? ?Regularly fix the edges of walkways and driveways and fix any cracks. ?Remove anything that might make you trip as you walk through a door, such as a raised step or threshold. ?Trim any bushes or trees on the path to your home. ?Use bright outdoor lighting. ?Clear any walking paths of anything that might make someone trip, such as rocks or tools. ?Regularly check to see if handrails are loose or broken. Make sure that both sides of any steps have handrails. ?Any raised decks and porches should have guardrails on the edges. ?Have any leaves, snow, or ice cleared regularly. ?Use sand or salt on walking paths during winter. ?Clean up any spills in your garage right away. This includes oil or grease spills. ?What can I do in the bathroom? ?Use night lights. ?Install grab bars by the toilet and in the tub and shower. Do not use towel bars as grab bars. ?Use non-skid mats or decals in the tub or shower. ?If you need to sit down in the shower, use a plastic, non-slip stool. ?Keep the floor dry. Clean up any water that spills on the floor as soon as it happens. ?Remove soap buildup in the tub or shower regularly. ?Attach bath mats securely with double-sided non-slip rug tape. ?Do not have throw rugs and other things on the floor that can make you trip. ?What can I do in the bedroom? ?Use night lights. ?Make  sure that you have a light by your bed that is easy to reach. ?Do not use any sheets or blankets that are too big for your bed. They should not hang down onto the floor. ?Have a firm chair that has side arms. You can use this for support while you get dressed. ?Do not have throw rugs and other things on the floor that can make you trip. ?What can I do in the kitchen? ?Clean up any spills right away. ?Avoid walking on wet floors. ?Keep items that you use a lot in easy-to-reach places. ?If you need to reach something above you, use a strong step stool that has a grab bar. ?Keep electrical cords out of the way. ?Do not use floor polish or wax that makes floors slippery. If you must use wax, use non-skid floor wax. ?Do not have throw rugs and other things on the floor that can make you trip. ?What can I do with my stairs? ?Do not leave any items on the stairs. ?Make sure that there are handrails on both sides of the stairs and use them. Fix handrails that are broken or loose. Make sure that handrails are as long as the stairways. ?Check any carpeting to make sure that it is firmly attached to the stairs. Fix any carpet that is loose or worn. ?Avoid having throw rugs at the top or bottom of the stairs. If you do  have throw rugs, attach them to the floor with carpet tape. ?Make sure that you have a light switch at the top of the stairs and the bottom of the stairs. If you do not have them, ask someone to add them for you. ?What else can I do to help prevent falls? ?Wear shoes that: ?Do not have high heels. ?Have rubber bottoms. ?Are comfortable and fit you well. ?Are closed at the toe. Do not wear sandals. ?If you use a stepladder: ?Make sure that it is fully opened. Do not climb a closed stepladder. ?Make sure that both sides of the stepladder are locked into place. ?Ask someone to hold it for you, if possible. ?Clearly mark and make sure that you can see: ?Any grab bars or handrails. ?First and last steps. ?Where the  edge of each step is. ?Use tools that help you move around (mobility aids) if they are needed. These include: ?Canes. ?Walkers. ?Scooters. ?Crutches. ?Turn on the lights when you go into a dark area. Repl

## 2021-12-21 NOTE — Progress Notes (Signed)
? ?Subjective:  ? Thomas Allen is a 81 y.o. male who presents for Medicare Annual/Subsequent preventive examination. ? ?Virtual Visit via Telephone Note ? ?I connected with  Thomas Allen on 12/21/21 at  1:30 PM EDT by telephone and verified that I am speaking with the correct person using two identifiers. ? ?Location: ?Patient: home ?Provider: [facility] ?Persons participating in the virtual visit: patient/Nurse Health Advisor ?  ?I discussed the limitations, risks, security and privacy concerns of performing an evaluation and management service by telephone and the availability of in person appointments. The patient expressed understanding and agreed to proceed. ? ?Interactive audio and video telecommunications were attempted between this nurse and patient, however failed, due to patient having technical difficulties OR patient did not have access to video capability.  We continued and completed visit with audio only. ? ?Some vital signs may be absent or patient reported.  ? ?Clemetine Marker, LPN ? ? ?Review of Systems    ? ?Cardiac Risk Factors include: advanced age (>4mn, >>83women);male gender;dyslipidemia;diabetes mellitus;hypertension ? ?   ?Objective:  ?  ?There were no vitals filed for this visit. ?There is no height or weight on file to calculate BMI. ? ? ?  12/21/2021  ?  1:42 PM 02/28/2021  ?  2:30 PM 01/05/2021  ?  3:05 PM 12/13/2020  ?  2:11 PM 12/03/2020  ?  9:59 AM 11/26/2019  ?  8:55 AM 11/11/2018  ?  8:53 AM  ?Advanced Directives  ?Does Patient Have a Medical Advance Directive? Yes No Yes Yes Yes Yes Yes  ?Type of AParamed[place]of [place] will  Healthcare Power of [place]  [place] Living will;Healthcare Power of Attorney  ?Copy of [place] Chart? Yes - validated most recent copy scanned in chart (See row information)   Yes - validated most recent copy scanned in chart (See row information)   No - copy requested Yes - validated most recent copy scanned in chart (See row information)  ?Would patient like information on creating a medical advance directive?  No - Patient declined       ? ? ?Current Medications (verified) ?Outpatient Encounter Medications as of 12/21/2021  ?Medication Sig  ? enzalutamide (XTANDI) 40 MG tablet Take 80 mg by mouth daily.  ? metFORMIN (GLUCOPHAGE) 1000 MG tablet TAKE 1 TABLET BY MOUTH TWICE A DAY (Patient taking differently: Pt taking once daily in AM)  ? omeprazole (PRILOSEC) 20 MG capsule TAKE 1 CAPSULE (20 MG TOTAL) BY MOUTH 2 (TWO) TIMES DAILY BEFORE A MEAL. (Patient taking differently: Take 20 mg by mouth 2 (two) times daily before a meal. Pt taking once daily at night)  ? vitamin B-12 (CYANOCOBALAMIN) 1000 MCG tablet Take 1,000 mcg by mouth daily.  ? [DISCONTINUED] HYDROcodone-acetaminophen (NORCO/VICODIN) 5-325 MG tablet Take 1 tablet by mouth every 6 (six) hours as needed.  ? [DISCONTINUED] losartan (COZAAR) 25 MG tablet TAKE 1 TABLET BY MOUTH EVERY DAY  ? [DISCONTINUED] Multiple Vitamins-Minerals (MULTIVITAMIN WITH MINERALS) tablet Take 1 tablet by mouth daily.  ? ?No facility-administered encounter medications on file as of 12/21/2021.  ? ? ?Allergies (verified) ?Ace inhibitors  ? ?History: ?Past Medical History:  ?Diagnosis Date  ? Arthritis   ? Cancer ([facility]   ? Diabetes mellitus without complication ([place]   ? GERD (gastroesophageal reflux disease)   ? Haglund's deformity   ? History of kidney stones   ? Hyperlipidemia   ?  Hypertension   ? Intractable hiccups 02/05/2017  ? Prostate CA [facility])   ? Sleep apnea   ? ?Past Surgical History:  ?Procedure Laterality Date  ? APPENDECTOMY    ? COLONOSCOPY  2008  ? benign polyps  ? COLONOSCOPY WITH PROPOFOL N/A 08/07/2017  ? Procedure: COLONOSCOPY WITH PROPOFOL;  Surgeon: Lollie Sails, MD;  Location: [facility] ENDOSCOPY;  Service: Endoscopy;  Laterality: N/A;  ? COLONOSCOPY WITH PROPOFOL N/A 12/03/2020  ? Procedure: COLONOSCOPY WITH  PROPOFOL;  Surgeon: Lesly Rubenstein, MD;  Location: [facility] ENDOSCOPY;  Service: Endoscopy;  Laterality: N/A;  ? ESOPHAGOGASTRODUODENOSCOPY (EGD) WITH PROPOFOL N/A 08/07/2017  ? Procedure: ESOPHAGOGASTRODUODENOSCOPY (EGD) WITH PROPOFOL;  Surgeon: Lollie Sails, MD;  Location: [facility] ENDOSCOPY;  Service: Endoscopy;  Laterality: N/A;  ? JOINT REPLACEMENT Right 2009  ? JOINT REPLACEMENT Left 2015  ? lithopexy    ? PROSTATE BIOPSY  2012  ? malignant  ? REPLACEMENT TOTAL KNEE Bilateral   ? ?Family History  ?Problem Relation Age of Onset  ? Diabetes Mother   ? Colon cancer Mother   ? Colon cancer Father   ? Prostate cancer Neg Hx   ? Bladder Cancer Neg Hx   ? Kidney cancer Neg Hx   ? ?Social History  ? ?Socioeconomic History  ? Marital status: Married  ?  Spouse name: Not on file  ? Number of children: 3  ? Years of education: Not on file  ? Highest education level: Master's degree (e.g., MA, MS, MEng, MEd, MSW, MBA)  ?Occupational History  ?  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 Smoking status: Former  ?  Types: Cigarettes  ?  Quit date: 78  ?  Years since quitting: 47.3  ? Smokeless tobacco: Never  ?Vaping Use  ? Vaping Use: Never used  ?Substance and Sexual Activity  ? Alcohol use: Yes  ?  Alcohol/week: 14.0 standard drinks  ?  Types: 14 Shots of liquor per week  ?  Comment: 2 scotch drinks per night  ? Drug use: No  ? Sexual activity: Not on file  ?Other Topics Concern  ? Not on file  ?Social History Narrative  ? Not on file  ? ?Social Determinants of Health  ? ?Financial Resource Strain: Low Risk   ? Difficulty of Paying Living Expenses: Not hard at all  ?Food Insecurity: No Food Insecurity  ? Worried About Charity fundraiser in the Last Year: Never true  ? Ran Out of Food in the Last Year: Never true  ?Transportation Needs: No Transportation Needs  ? Lack of Transportation (Medical): No  ? Lack of Transportation (Non-Medical): No  ?Physical Activity: Sufficiently Active  ? Days of Exercise per  Week: 5 days  ? Minutes of Exercise per Session: 40 min  ?Stress: No Stress Concern Present  ? Feeling of Stress : Not at all  ?Social Connections: Moderately Integrated  ? Frequency of Communication with Friends and Family: More than three times a week  ? Frequency of Social Gatherings with Friends and Family: Once a week  ? Attends Religious Services: More than 4 times per year  ? Active Member of Clubs or Organizations: No  ? Attends Archivist Meetings: Never  ? Marital Status: Married  ? ? ?Tobacco Counseling ?Counseling given: Not Answered ? ? ?Clinical Intake: ? ?Pre-visit preparation completed: Yes ? ?Pain : No/denies pain ? ?  ? ?Nutritional Risks: None ?Diabetes: Yes ?CBG done?: No ?Did pt. bring in CBG  monitor from home?: No ? ?How often do you need to have someone help you when you read instructions, pamphlets, or other written materials from your doctor or pharmacy?: 1 - Never ? ?Nutrition Risk Assessment: ? ?Has the patient had any N/V/D within the last 2 months?  No  ?Does the patient have any non-healing wounds?  No  ?Has the patient had any unintentional weight loss or weight gain?  No  ? ?Diabetes: ? ?Is the patient diabetic?  Yes  ?If diabetic, was a CBG obtained today?  No  ?Did the patient bring in their glucometer from home?  No  ?How often do you monitor your CBG's? Rarely per patient.  ? ?Financial Strains and Diabetes Management: ? ?Are you having any financial strains with the device, your supplies or your medication? No .  ?Does the patient want to be seen by Chronic Care Management for management of their diabetes?  No  ?Would the patient like to be referred to a Nutritionist or for Diabetic Management?  No  ? ?Diabetic Exams: ? ?Diabetic Eye Exam: Completed per patient; need records from [facility].  ? ?Diabetic Foot Exam: Completed 12/23/19. Pt has been advised about the importance in completing this exam. Pt is scheduled for diabetic foot exam on 01/09/22.   ? ? ?Interpreter Needed?: No ? ?Information entered by :: Clemetine Marker LPN ? ? ?Activities of Daily Living ? ?  12/21/2021  ?  1:42 PM 10/12/2021  ?  9:34 AM  ?In your present state of health, do you have any difficulty p

## 2021-12-25 DIAGNOSIS — Z20822 Contact with and (suspected) exposure to covid-19: Secondary | ICD-10-CM | POA: Diagnosis not present

## 2022-01-09 ENCOUNTER — Encounter: Payer: Self-pay | Admitting: Internal Medicine

## 2022-01-09 ENCOUNTER — Ambulatory Visit (INDEPENDENT_AMBULATORY_CARE_PROVIDER_SITE_OTHER): Payer: Medicare Other | Admitting: Internal Medicine

## 2022-01-09 VITALS — BP 94/64 | HR 73 | Ht 68.0 in | Wt 172.0 lb

## 2022-01-09 DIAGNOSIS — E785 Hyperlipidemia, unspecified: Secondary | ICD-10-CM

## 2022-01-09 DIAGNOSIS — E538 Deficiency of other specified B group vitamins: Secondary | ICD-10-CM | POA: Diagnosis not present

## 2022-01-09 DIAGNOSIS — E1169 Type 2 diabetes mellitus with other specified complication: Secondary | ICD-10-CM | POA: Diagnosis not present

## 2022-01-09 DIAGNOSIS — E440 Moderate protein-calorie malnutrition: Secondary | ICD-10-CM | POA: Diagnosis not present

## 2022-01-09 DIAGNOSIS — E118 Type 2 diabetes mellitus with unspecified complications: Secondary | ICD-10-CM | POA: Diagnosis not present

## 2022-01-09 DIAGNOSIS — K4090 Unilateral inguinal hernia, without obstruction or gangrene, not specified as recurrent: Secondary | ICD-10-CM | POA: Diagnosis not present

## 2022-01-09 NOTE — Progress Notes (Signed)
? ? ?Date:  01/09/2022  ? ?Name:  Thomas Allen   DOB:  09-13-1940   MRN:  [id number] ? ? ?Chief Complaint: Diabetes and Groin Pain (Right Groin Pain. Started 3 weeks ago. Hurts when sitting.) ? ?Diabetes ?He presents for his follow-up diabetic visit. He has type 2 diabetes mellitus. His disease course has been stable. Pertinent negatives for hypoglycemia include no dizziness or headaches. Pertinent negatives for diabetes include no chest pain, no fatigue and no weakness. Current diabetic treatment includes oral agent (monotherapy). He is compliant with treatment all of the time.  ?Groin Pain ?The patient's pertinent negatives include no testicular pain. Pertinent negatives include no abdominal pain, chest pain, constipation, coughing, diarrhea, headaches or shortness of breath.  ? ?Lab Results  ?Component Value Date  ? NA 131 (L) 10/12/2021  ? K 5.0 10/12/2021  ? CO2 22 10/12/2021  ? GLUCOSE 107 (H) 10/12/2021  ? BUN 13 10/12/2021  ? CREATININE 0.92 10/12/2021  ? CALCIUM 9.6 10/12/2021  ? EGFR 85 10/12/2021  ? GFRNONAA 58 (L) 01/05/2021  ? ?Lab Results  ?Component Value Date  ? CHOL 166 10/12/2021  ? HDL 45 10/12/2021  ? [place] 91 10/12/2021  ? TRIG 175 (H) 10/12/2021  ? CHOLHDL 3.7 10/12/2021  ? ?Lab Results  ?Component Value Date  ? TSH 2.040 02/05/2017  ? ?Lab Results  ?Component Value Date  ? HGBA1C 5.6 10/12/2021  ? ?Lab Results  ?Component Value Date  ? WBC 7.3 10/12/2021  ? HGB 12.2 (L) 10/12/2021  ? HCT 36.8 (L) 10/12/2021  ? MCV 103 (H) 10/12/2021  ? PLT 251 10/12/2021  ? ?Lab Results  ?Component Value Date  ? ALT 7 10/12/2021  ? AST 6 10/12/2021  ? ALKPHOS 58 10/12/2021  ? BILITOT 0.3 10/12/2021  ? ?No results found for: 25OHVITD2, [place], VD25OH  ? ?Review of Systems  ?Constitutional:  Negative for fatigue and unexpected weight change.  ?HENT:  Negative for nosebleeds.   ?Eyes:  Negative for visual disturbance.  ?Respiratory:  Negative for cough, chest tightness, shortness of breath and wheezing.    ?Cardiovascular:  Negative for chest pain, palpitations and leg swelling.  ?Gastrointestinal:  Negative for abdominal pain, constipation and diarrhea.  ?Genitourinary:  Negative for testicular pain.  ?     Right groin pain  ?Neurological:  Negative for dizziness, weakness, light-headedness and headaches.  ? ?Patient Active Problem List  ? Diagnosis Date Noted  ? Moderate protein-calorie malnutrition ([place]) 10/12/2021  ? Low back strain, initial encounter 01/28/2021  ? Hand pain, right 08/23/2017  ? Erectile dysfunction following radiation therapy 06/26/2017  ? Urinary frequency 06/18/2017  ? Hyperlipidemia associated with type 2 diabetes mellitus ([place]) 03/12/2017  ? Asymptomatic PVCs 02/05/2017  ? Gastroesophageal reflux disease 02/05/2017  ? Alcohol use disorder 01/19/2016  ? Type II diabetes mellitus with complication ([place]) 99/35/7017  ? Carpal tunnel syndrome 03/30/2015  ? Elevated blood pressure, situational 03/30/2015  ? Abnormal LFTs 03/30/2015  ? H/O adenomatous polyp of colon 03/30/2015  ? Tobacco use disorder, moderate, in sustained remission 03/30/2015  ? Prostate cancer ([place]) 09/02/2013  ? Posterior calcaneal exostosis 02/26/2013  ? Calcific Achilles tendinitis 02/26/2013  ? ? ?Allergies  ?Allergen Reactions  ? Ace Inhibitors Cough  ? ? ?Past Surgical History:  ?Procedure Laterality Date  ? APPENDECTOMY    ? COLONOSCOPY  2008  ? benign polyps  ? COLONOSCOPY WITH PROPOFOL N/A 08/07/2017  ? Procedure: COLONOSCOPY WITH PROPOFOL;  Surgeon: Lollie Sails, MD;  Location:  [facility];  Service: Endoscopy;  Laterality: N/A;  ? COLONOSCOPY WITH PROPOFOL N/A 12/03/2020  ? Procedure: COLONOSCOPY WITH PROPOFOL;  Surgeon: Lesly Rubenstein, MD;  Location: [facility] ENDOSCOPY;  Service: Endoscopy;  Laterality: N/A;  ? ESOPHAGOGASTRODUODENOSCOPY (EGD) WITH PROPOFOL N/A 08/07/2017  ? Procedure: ESOPHAGOGASTRODUODENOSCOPY (EGD) WITH PROPOFOL;  Surgeon: Lollie Sails, MD;  Location: [facility] ENDOSCOPY;  Service:  Endoscopy;  Laterality: N/A;  ? JOINT REPLACEMENT Right 2009  ? JOINT REPLACEMENT Left 2015  ? lithopexy    ? PROSTATE BIOPSY  2012  ? malignant  ? REPLACEMENT TOTAL KNEE Bilateral   ? ? ?Social History  ? ?Tobacco Use  ? Smoking status: Former  ?  Types: Cigarettes  ?  Quit date: 22  ?  Years since quitting: 47.3  ? Smokeless tobacco: Never  ?Vaping Use  ? Vaping Use: Never used  ?Substance Use Topics  ? Alcohol use: Yes  ?  Alcohol/week: 14.0 standard drinks  ?  Types: 14 Shots of liquor per week  ?  Comment: 2 scotch drinks per night  ? Drug use: No  ? ? ? ?Medication list has been reviewed and updated. ? ?Current Meds  ?Medication Sig  ? enzalutamide (XTANDI) 40 MG tablet Take 80 mg by mouth daily.  ? metFORMIN (GLUCOPHAGE) 1000 MG tablet TAKE 1 TABLET BY MOUTH TWICE A DAY (Patient taking differently: Pt taking once daily in AM)  ? omeprazole (PRILOSEC) 20 MG capsule TAKE 1 CAPSULE (20 MG TOTAL) BY MOUTH 2 (TWO) TIMES DAILY BEFORE A MEAL. (Patient taking differently: Take 20 mg by mouth 2 (two) times daily before a meal. Pt taking once daily at night)  ? ? ? ?  10/12/2021  ?  9:34 AM 01/28/2021  ?  1:33 PM 08/16/2020  ?  8:50 AM 12/23/2019  ?  8:51 AM  ?GAD 7 : Generalized Anxiety Score  ?Nervous, Anxious, on Edge 0 0 0 0  ?Control/stop worrying 0 0 0 0  ?Worry too much - different things 0 0 0 0  ?Trouble relaxing 0 0 0 0  ?Restless 0 0 0 0  ?Easily annoyed or irritable 0 0 0 0  ?Afraid - awful might happen 0 0 0 0  ?Total GAD 7 Score 0 0 0 0  ?Anxiety Difficulty  Not difficult at all  Not difficult at all  ? ? ? ?  12/21/2021  ?  1:39 PM  ?Depression screen PHQ 2/9  ?Decreased Interest 0  ?Down, Depressed, Hopeless 0  ?PHQ - 2 Score 0  ? ? ?BP Readings from Last 3 Encounters:  ?01/09/22 94/64  ?10/12/21 (!) 98/50  ?06/21/21 128/78  ? ? ?Physical Exam ?Vitals and nursing note reviewed.  ?Constitutional:   ?   General: He is not in acute distress. ?   Appearance: He is well-developed.  ?HENT:  ?   Head:  Normocephalic and atraumatic.  ?Cardiovascular:  ?   Rate and Rhythm: Normal rate and regular rhythm.  ?   Pulses: Normal pulses.  ?   Heart sounds: No murmur heard. ?Pulmonary:  ?   Effort: Pulmonary effort is normal. No respiratory distress.  ?   Breath sounds: No wheezing or rhonchi.  ?Abdominal:  ?   Hernia: A hernia is present. Hernia is present in the right inguinal area.  ?Musculoskeletal:  ?   Cervical back: Normal range of motion.  ?   Right lower leg: No edema.  ?   Left lower leg: No edema.  ?Skin: ?  General: Skin is warm and dry.  ?   Findings: No rash.  ?Neurological:  ?   Mental Status: He is alert and oriented to person, place, and time.  ?Psychiatric:     ?   Mood and Affect: Mood normal.     ?   Behavior: Behavior normal.  ? ? ?Wt Readings from Last 3 Encounters:  ?01/09/22 172 lb (78 kg)  ?10/12/21 170 lb (77.1 kg)  ?06/21/21 173 lb 3.2 oz (78.6 kg)  ? ? ?BP 94/64   Pulse 73   Ht $R'5\' 8"'xn$  (1.727 m)   Wt 172 lb (78 kg)   SpO2 99%   BMI 26.15 kg/m?  ? ?Assessment and Plan: ?1. Type II diabetes mellitus with complication ([place]) ?Clinically stable by exam and report without s/s of hypoglycemia. ?DM complicated by dyslipidemia. ?Did not tolerate losartan due to low BP - still running < 120 syst at home. ?Tolerating medications well without side effects or other concerns. ? ?2. B12 nutritional deficiency ?On oral supplement - check levels and advise if injection are needed ?- CBC with Differential/Platelet ?- Vitamin B12 ? ?3. Hyperlipidemia associated with type 2 diabetes mellitus ([place]) ?Continue low fat diet - no medications for now due to age ? ?4. Moderate protein-calorie malnutrition ([place]) ?Weight has stabilized  ?Will continue to monitor ? ?5. Right inguinal hernia ?Tender right inguinal region with a tender mass, partially reducible. ?- Ambulatory referral to General Surgery ? ? ?Partially dictated using Editor, commissioning. Any errors are unintentional. ? ?Halina Maidens, MD ?[facility] ?[facility] [place] [facility] ? ?01/09/2022 ? ? ? ? ?

## 2022-01-10 ENCOUNTER — Encounter: Payer: Self-pay | Admitting: Surgery

## 2022-01-10 ENCOUNTER — Ambulatory Visit: Payer: Self-pay | Admitting: Surgery

## 2022-01-10 ENCOUNTER — Ambulatory Visit (INDEPENDENT_AMBULATORY_CARE_PROVIDER_SITE_OTHER): Payer: Medicare Other | Admitting: Surgery

## 2022-01-10 VITALS — BP 106/65 | HR 85 | Temp 97.6°F | Ht 68.0 in | Wt 172.8 lb

## 2022-01-10 DIAGNOSIS — K4090 Unilateral inguinal hernia, without obstruction or gangrene, not specified as recurrent: Secondary | ICD-10-CM | POA: Diagnosis not present

## 2022-01-10 LAB — CBC WITH DIFFERENTIAL/PLATELET
Basophils Absolute: 0 10*3/uL (ref 0.0–0.2)
Basos: 0 %
EOS (ABSOLUTE): 0.2 10*3/uL (ref 0.0–0.4)
Eos: 2 %
Hematocrit: 37.1 % — ABNORMAL LOW (ref 37.5–51.0)
Hemoglobin: 12.5 g/dL — ABNORMAL LOW (ref 13.0–17.7)
Immature Grans (Abs): 0 10*3/uL (ref 0.0–0.1)
Immature Granulocytes: 0 %
Lymphocytes Absolute: 1.4 10*3/uL (ref 0.7–3.1)
Lymphs: 20 %
MCH: 33.3 pg — ABNORMAL HIGH (ref 26.6–33.0)
MCHC: 33.7 g/dL (ref 31.5–35.7)
MCV: 99 fL — ABNORMAL HIGH (ref 79–97)
Monocytes Absolute: 0.6 10*3/uL (ref 0.1–0.9)
Monocytes: 9 %
Neutrophils Absolute: 4.8 10*3/uL (ref 1.4–7.0)
Neutrophils: 69 %
Platelets: 233 10*3/uL (ref 150–450)
RBC: 3.75 x10E6/uL — ABNORMAL LOW (ref 4.14–5.80)
RDW: 12.1 % (ref 11.6–15.4)
WBC: 7 10*3/uL (ref 3.4–10.8)

## 2022-01-10 LAB — VITAMIN B12: Vitamin B-12: >2000 pg/mL — ABNORMAL HIGH (ref 232–1245)

## 2022-01-10 NOTE — H&P (View-Only) (Signed)
Patient ID: Thomas Allen, male   DOB: 1941/07/29, 81 y.o.   MRN: [id number] ? ?Chief Complaint: Right groin pain. ? ?History of Present Illness ?Thomas Allen is a 81 y.o. male with a 3-week history of right groin bulge with associated pain.  Was lifting some unusually heavy items and began experiencing pain that is exacerbated now with gravity, sitting up, sneezing and coughing.  He reports it spontaneously reduces when laying supine or deeply reclined in a recliner.  The pain can be as severe as an 8 out of 10.  He denies nausea, vomiting, fevers and chills.  He notes no bowel habit changes or changes with voiding.  He is currently being treated for prostate cancer. ? ? ?Past Medical History ?Past Medical History:  ?Diagnosis Date  ? Arthritis   ? Cancer [facility])   ? Diabetes mellitus without complication ([place])   ? GERD (gastroesophageal reflux disease)   ? Haglund's deformity   ? History of kidney stones   ? Hyperlipidemia   ? Hypertension   ? Intractable hiccups 02/05/2017  ? Prostate CA [facility])   ? Sleep apnea   ?  ? ? ?Past Surgical History:  ?Procedure Laterality Date  ? APPENDECTOMY    ? COLONOSCOPY  2008  ? benign polyps  ? COLONOSCOPY WITH PROPOFOL N/A 08/07/2017  ? Procedure: COLONOSCOPY WITH PROPOFOL;  Surgeon: Lollie Sails, MD;  Location: [facility] ENDOSCOPY;  Service: Endoscopy;  Laterality: N/A;  ? COLONOSCOPY WITH PROPOFOL N/A 12/03/2020  ? Procedure: COLONOSCOPY WITH PROPOFOL;  Surgeon: Lesly Rubenstein, MD;  Location: [facility] ENDOSCOPY;  Service: Endoscopy;  Laterality: N/A;  ? ESOPHAGOGASTRODUODENOSCOPY (EGD) WITH PROPOFOL N/A 08/07/2017  ? Procedure: ESOPHAGOGASTRODUODENOSCOPY (EGD) WITH PROPOFOL;  Surgeon: Lollie Sails, MD;  Location: [facility] ENDOSCOPY;  Service: Endoscopy;  Laterality: N/A;  ? lithopexy    ? PROSTATE BIOPSY  2012  ? malignant  ? REPLACEMENT TOTAL KNEE Bilateral   ? [id number]  ? ? ?Allergies  ?Allergen Reactions  ? Ace Inhibitors Cough  ? ? ?Current Outpatient Medications  ?Medication  Sig Dispense Refill  ? enzalutamide (XTANDI) 40 MG tablet Take 80 mg by mouth daily.    ? metFORMIN (GLUCOPHAGE) 1000 MG tablet TAKE 1 TABLET BY MOUTH TWICE A DAY (Patient taking differently: Pt taking once daily in AM) 180 tablet 0  ? omeprazole (PRILOSEC) 20 MG capsule TAKE 1 CAPSULE (20 MG TOTAL) BY MOUTH 2 (TWO) TIMES DAILY BEFORE A MEAL. (Patient taking differently: Take 20 mg by mouth 2 (two) times daily before a meal. Pt taking once daily at night) 180 capsule 0  ? vitamin B-12 (CYANOCOBALAMIN) 1000 MCG tablet Take 1,000 mcg by mouth daily.    ? ?No current facility-administered medications for this visit.  ? ? ?Family History ?Family History  ?Problem Relation Age of Onset  ? Diabetes Mother   ? Colon cancer Mother   ? Colon cancer Father   ? Prostate cancer Neg Hx   ? Bladder Cancer Neg Hx   ? Kidney cancer Neg Hx   ?  ? ? ?Social History ?Social History  ? ?Tobacco Use  ? Smoking status: Former  ?  Types: Cigarettes  ?  Quit date: 46  ?  Years since quitting: 47.3  ? Smokeless tobacco: Never  ?Vaping Use  ? Vaping Use: Never used  ?Substance Use Topics  ? Alcohol use: Yes  ?  Alcohol/week: 14.0 standard drinks  ?  Types: 14 Shots of liquor per week  ?  Comment: 2 scotch drinks per night  ? Drug use: No  ?  ?  ? ? ?Review of Systems  ?Constitutional: Negative.   ?HENT: Negative.    ?Eyes: Negative.   ?Respiratory: Negative.    ?Cardiovascular: Negative.   ?Genitourinary:  Positive for flank pain and frequency.  ?Skin: Negative.   ?Neurological: Negative.   ?Psychiatric/Behavioral: Negative.    ?  ? ?Physical Exam ?Blood pressure 106/65, pulse 85, temperature 97.6 ?F (36.4 ?C), temperature source Oral, height '5\' 8"'$  (1.727 m), weight 172 lb 12.8 oz (78.4 kg), SpO2 98 %. ?Last Weight  Most recent update: 01/10/2022  2:19 PM  ? ? Weight  ?78.4 kg (172 lb 12.8 oz)  ?      ? ?  ? ? ?CONSTITUTIONAL: Well developed, and nourished, appropriately responsive and aware without distress.   ?EYES: Sclera non-icteric.    ?EARS, NOSE, MOUTH AND THROAT:  The oropharynx is clear. Oral mucosa is pink and moist.   Hearing is intact to voice.  ?NECK: Trachea is midline, and there is no jugular venous distension.  ?LYMPH NODES:  Lymph nodes in the neck are not enlarged. ?RESPIRATORY:  Lungs are clear, and breath sounds are equal bilaterally. Normal respiratory effort without pathologic use of accessory muscles. ?CARDIOVASCULAR: Heart is regular in rate and rhythm. ?GI: The abdomen is soft, nontender, and nondistended. There were no palpable masses. I did not appreciate hepatosplenomegaly. There were normal bowel sounds. ?GU: Right inguinal hernia, likely direct readily appreciable.  No tender bulge or mass in the left groin.  Bilateral testes descended.  ?MUSCULOSKELETAL:  Symmetrical muscle tone appreciated in all four extremities.    ?SKIN: Skin turgor is normal. No pathologic skin lesions appreciated.  ?NEUROLOGIC:  Motor and sensation appear grossly normal.  Cranial nerves are grossly without defect. ?PSYCH:  Alert and oriented to person, place and time. Affect is appropriate for situation. ? ?Data Reviewed ?I have personally reviewed what is currently available of the patient's imaging, recent labs and medical records.   ?Labs:  ? ?  Latest Ref Rng & Units 01/09/2022  ? 10:35 AM 10/12/2021  ? 10:30 AM 06/22/2021  ? 11:51 AM  ?CBC  ?WBC 3.4 - 10.8 x10E3/uL 7.0   7.3   8.2    ?Hemoglobin 13.0 - 17.7 g/dL 12.5   12.2   13.6    ?Hematocrit 37.5 - 51.0 % 37.1   36.8   40.4    ?Platelets 150 - 450 x10E3/uL 233   251   234    ? ? ?  Latest Ref Rng & Units 10/12/2021  ? 10:30 AM 06/22/2021  ? 11:51 AM 01/19/2021  ? 12:00 AM  ?CMP  ?Glucose 70 - 99 mg/dL 107   81     ?BUN 8 - 27 mg/dL '13   26   18       '$ ?Creatinine 0.76 - 1.27 mg/dL 0.92   1.33   0.9       ?Sodium 134 - 144 mmol/L 131   132     ?Potassium 3.5 - 5.2 mmol/L 5.0   4.4     ?Chloride 96 - 106 mmol/L 94   92     ?CO2 20 - 29 mmol/L 22   17     ?Calcium 8.6 - 10.2 mg/dL 9.6   10.0   10.3        ?Total Protein 6.0 - 8.5 g/dL 6.5      ?Total Bilirubin 0.0 - 1.2  mg/dL 0.3      ?Alkaline Phos 44 - 121 IU/L 58      ?AST 0 - 40 IU/L 6      ?ALT 0 - 44 IU/L 7      ?  ? This result is from an external source.  ? ? ? ? ?Imaging: ? ?Within last 24 hrs: No results found. ? ?Assessment ?   ?Right inguinal hernia ?Patient Active Problem List  ? Diagnosis Date Noted  ? Moderate protein-calorie malnutrition ([place]) 10/12/2021  ? Low back strain, initial encounter 01/28/2021  ? Hand pain, right 08/23/2017  ? Erectile dysfunction following radiation therapy 06/26/2017  ? Urinary frequency 06/18/2017  ? Hyperlipidemia associated with type 2 diabetes mellitus ([place]) 03/12/2017  ? Asymptomatic PVCs 02/05/2017  ? Gastroesophageal reflux disease 02/05/2017  ? Alcohol use disorder 01/19/2016  ? Type II diabetes mellitus with complication ([place]) 91/47/8295  ? Carpal tunnel syndrome 03/30/2015  ? Elevated blood pressure, situational 03/30/2015  ? Abnormal LFTs 03/30/2015  ? H/O adenomatous polyp of colon 03/30/2015  ? Tobacco use disorder, moderate, in sustained remission 03/30/2015  ? Prostate cancer ([place]) 09/02/2013  ? Posterior calcaneal exostosis 02/26/2013  ? Calcific Achilles tendinitis 02/26/2013  ? ? ?Plan ?   ?Robotic repair of right inguinal hernia. ? ?I discussed possibility of incarceration, strangulation, enlargement in size over time, and the need for emergency surgery in the face of these.  Also reviewed the techniques of reduction should incarceration occur, and when unsuccessful to present to the ED.  Also discussed that surgery risks include recurrence which can be up to 30% in the case of complex hernias, use of prosthetic materials (mesh) and the increased risk of infection and the possible need for re-operation and removal of mesh, possibility of post-op SBO or ileus, and the risks of general anesthetic including heart attack, stroke, sudden death or some reaction to anesthetic medications. The patient, and  those present, appear to understand the risks, any and all questions were answered to the patient's satisfaction.  No guarantees were ever expressed or implied.  ? ?Face-to-face time spent with the pati

## 2022-01-10 NOTE — Progress Notes (Signed)
Patient ID: Thomas Allen, male   DOB: 09-20-1940, 81 y.o.   MRN: [id number] ? ?Chief Complaint: Right groin pain. ? ?History of Present Illness ?Thomas Allen is a 81 y.o. male with a 3-week history of right groin bulge with associated pain.  Was lifting some unusually heavy items and began experiencing pain that is exacerbated now with gravity, sitting up, sneezing and coughing.  He reports it spontaneously reduces when laying supine or deeply reclined in a recliner.  The pain can be as severe as an 8 out of 10.  He denies nausea, vomiting, fevers and chills.  He notes no bowel habit changes or changes with voiding.  He is currently being treated for prostate cancer. ? ? ?Past Medical History ?Past Medical History:  ?Diagnosis Date  ? Arthritis   ? Cancer [facility])   ? Diabetes mellitus without complication ([place])   ? GERD (gastroesophageal reflux disease)   ? Haglund's deformity   ? History of kidney stones   ? Hyperlipidemia   ? Hypertension   ? Intractable hiccups 02/05/2017  ? Prostate [facility])   ? Sleep apnea   ?  ? ? ?Past Surgical History:  ?Procedure Laterality Date  ? APPENDECTOMY    ? COLONOSCOPY  2008  ? benign polyps  ? COLONOSCOPY WITH PROPOFOL N/A 08/07/2017  ? Procedure: COLONOSCOPY WITH PROPOFOL;  Surgeon: Lollie Sails, MD;  Location: [facility] ENDOSCOPY;  Service: Endoscopy;  Laterality: N/A;  ? COLONOSCOPY WITH PROPOFOL N/A 12/03/2020  ? Procedure: COLONOSCOPY WITH PROPOFOL;  Surgeon: Lesly Rubenstein, MD;  Location: [facility] ENDOSCOPY;  Service: Endoscopy;  Laterality: N/A;  ? ESOPHAGOGASTRODUODENOSCOPY (EGD) WITH PROPOFOL N/A 08/07/2017  ? Procedure: ESOPHAGOGASTRODUODENOSCOPY (EGD) WITH PROPOFOL;  Surgeon: Lollie Sails, MD;  Location: [facility] ENDOSCOPY;  Service: Endoscopy;  Laterality: N/A;  ? lithopexy    ? PROSTATE BIOPSY  2012  ? malignant  ? REPLACEMENT TOTAL KNEE Bilateral   ? [id number]  ? ? ?Allergies  ?Allergen Reactions  ? Ace Inhibitors Cough  ? ? ?Current Outpatient Medications  ?Medication  Sig Dispense Refill  ? enzalutamide (XTANDI) 40 MG tablet Take 80 mg by mouth daily.    ? metFORMIN (GLUCOPHAGE) 1000 MG tablet TAKE 1 TABLET BY MOUTH TWICE A DAY (Patient taking differently: Pt taking once daily in AM) 180 tablet 0  ? omeprazole (PRILOSEC) 20 MG capsule TAKE 1 CAPSULE (20 MG TOTAL) BY MOUTH 2 (TWO) TIMES DAILY BEFORE A MEAL. (Patient taking differently: Take 20 mg by mouth 2 (two) times daily before a meal. Pt taking once daily at night) 180 capsule 0  ? vitamin B-12 (CYANOCOBALAMIN) 1000 MCG tablet Take 1,000 mcg by mouth daily.    ? ?No current facility-administered medications for this visit.  ? ? ?Family History ?Family History  ?Problem Relation Age of Onset  ? Diabetes Mother   ? Colon cancer Mother   ? Colon cancer Father   ? Prostate cancer Neg Hx   ? Bladder Cancer Neg Hx   ? Kidney cancer Neg Hx   ?  ? ? ?Social History ?Social History  ? ?Tobacco Use  ? Smoking status: Former  ?  Types: Cigarettes  ?  Quit date: 1  ?  Years since quitting: 47.3  ? Smokeless tobacco: Never  ?Vaping Use  ? Vaping Use: Never used  ?Substance Use Topics  ? Alcohol use: Yes  ?  Alcohol/week: 14.0 standard drinks  ?  Types: 14 Shots of liquor per week  ?  Comment: 2 scotch drinks per night  ? Drug use: No  ?  ?  ? ? ?Review of Systems  ?Constitutional: Negative.   ?HENT: Negative.    ?Eyes: Negative.   ?Respiratory: Negative.    ?Cardiovascular: Negative.   ?Genitourinary:  Positive for flank pain and frequency.  ?Skin: Negative.   ?Neurological: Negative.   ?Psychiatric/Behavioral: Negative.    ?  ? ?Physical Exam ?Blood pressure 106/65, pulse 85, temperature 97.6 ?F (36.4 ?C), temperature source Oral, height '5\' 8"'$  (1.727 m), weight 172 lb 12.8 oz (78.4 kg), SpO2 98 %. ?Last Weight  Most recent update: 01/10/2022  2:19 PM  ? ? Weight  ?78.4 kg (172 lb 12.8 oz)  ?      ? ?  ? ? ?CONSTITUTIONAL: Well developed, and nourished, appropriately responsive and aware without distress.   ?EYES: Sclera non-icteric.    ?EARS, NOSE, MOUTH AND THROAT:  The oropharynx is clear. Oral mucosa is pink and moist.   Hearing is intact to voice.  ?NECK: Trachea is midline, and there is no jugular venous distension.  ?LYMPH NODES:  Lymph nodes in the neck are not enlarged. ?RESPIRATORY:  Lungs are clear, and breath sounds are equal bilaterally. Normal respiratory effort without pathologic use of accessory muscles. ?CARDIOVASCULAR: Heart is regular in rate and rhythm. ?GI: The abdomen is soft, nontender, and nondistended. There were no palpable masses. I did not appreciate hepatosplenomegaly. There were normal bowel sounds. ?GU: Right inguinal hernia, likely direct readily appreciable.  No tender bulge or mass in the left groin.  Bilateral testes descended.  ?MUSCULOSKELETAL:  Symmetrical muscle tone appreciated in all four extremities.    ?SKIN: Skin turgor is normal. No pathologic skin lesions appreciated.  ?NEUROLOGIC:  Motor and sensation appear grossly normal.  Cranial nerves are grossly without defect. ?PSYCH:  Alert and oriented to person, place and time. Affect is appropriate for situation. ? ?Data Reviewed ?I have personally reviewed what is currently available of the patient's imaging, recent labs and medical records.   ?Labs:  ? ?  Latest Ref Rng & Units 01/09/2022  ? 10:35 AM 10/12/2021  ? 10:30 AM 06/22/2021  ? 11:51 AM  ?CBC  ?WBC 3.4 - 10.8 x10E3/uL 7.0   7.3   8.2    ?Hemoglobin 13.0 - 17.7 g/dL 12.5   12.2   13.6    ?Hematocrit 37.5 - 51.0 % 37.1   36.8   40.4    ?Platelets 150 - 450 x10E3/uL 233   251   234    ? ? ?  Latest Ref Rng & Units 10/12/2021  ? 10:30 AM 06/22/2021  ? 11:51 AM 01/19/2021  ? 12:00 AM  ?CMP  ?Glucose 70 - 99 mg/dL 107   81     ?BUN 8 - 27 mg/dL '13   26   18       '$ ?Creatinine 0.76 - 1.27 mg/dL 0.92   1.33   0.9       ?Sodium 134 - 144 mmol/L 131   132     ?Potassium 3.5 - 5.2 mmol/L 5.0   4.4     ?Chloride 96 - 106 mmol/L 94   92     ?CO2 20 - 29 mmol/L 22   17     ?Calcium 8.6 - 10.2 mg/dL 9.6   10.0   10.3        ?Total Protein 6.0 - 8.5 g/dL 6.5      ?Total Bilirubin 0.0 - 1.2  mg/dL 0.3      ?Alkaline Phos 44 - 121 IU/L 58      ?AST 0 - 40 IU/L 6      ?ALT 0 - 44 IU/L 7      ?  ? This result is from an external source.  ? ? ? ? ?Imaging: ? ?Within last 24 hrs: No results found. ? ?Assessment ?   ?Right inguinal hernia ?Patient Active Problem List  ? Diagnosis Date Noted  ? Moderate protein-calorie malnutrition ([place]) 10/12/2021  ? Low back strain, initial encounter 01/28/2021  ? Hand pain, right 08/23/2017  ? Erectile dysfunction following radiation therapy 06/26/2017  ? Urinary frequency 06/18/2017  ? Hyperlipidemia associated with type 2 diabetes mellitus ([place]) 03/12/2017  ? Asymptomatic PVCs 02/05/2017  ? Gastroesophageal reflux disease 02/05/2017  ? Alcohol use disorder 01/19/2016  ? Type II diabetes mellitus with complication ([place]) 82/64/1583  ? Carpal tunnel syndrome 03/30/2015  ? Elevated blood pressure, situational 03/30/2015  ? Abnormal LFTs 03/30/2015  ? H/O adenomatous polyp of colon 03/30/2015  ? Tobacco use disorder, moderate, in sustained remission 03/30/2015  ? Prostate cancer ([place]) 09/02/2013  ? Posterior calcaneal exostosis 02/26/2013  ? Calcific Achilles tendinitis 02/26/2013  ? ? ?Plan ?   ?Robotic repair of right inguinal hernia. ? ?I discussed possibility of incarceration, strangulation, enlargement in size over time, and the need for emergency surgery in the face of these.  Also reviewed the techniques of reduction should incarceration occur, and when unsuccessful to present to the ED.  Also discussed that surgery risks include recurrence which can be up to 30% in the case of complex hernias, use of prosthetic materials (mesh) and the increased risk of infection and the possible need for re-operation and removal of mesh, possibility of post-op SBO or ileus, and the risks of general anesthetic including heart attack, stroke, sudden death or some reaction to anesthetic medications. The patient, and  those present, appear to understand the risks, any and all questions were answered to the patient's satisfaction.  No guarantees were ever expressed or implied.  ? ?Face-to-face time spent with the pati

## 2022-01-10 NOTE — Patient Instructions (Signed)
If you have any concerns or questions, please feel free to call our office.   Inguinal Hernia, Adult An inguinal hernia is when fat or your intestines push through a weak spot in a muscle where your leg meets your lower belly (groin). This causes a bulge. This kind of hernia could also be: In your scrotum, if you are male. In folds of skin around your vagina, if you are male. There are three types of inguinal hernias: Hernias that can be pushed back into the belly (are reducible). This type rarely causes pain. Hernias that cannot be pushed back into the belly (are incarcerated). Hernias that cannot be pushed back into the belly and lose their blood supply (are strangulated). This type needs emergency surgery. What are the causes? This condition is caused by having a weak spot in the muscles or tissues in your groin. This develops over time. The hernia may poke through the weak spot when you strain your lower belly muscles all of a sudden, such as when you: Lift a heavy object. Strain to poop (have a bowel movement). Trouble pooping (constipation) can lead to straining. Cough. What increases the risk? This condition is more likely to develop in: Males. Pregnant females. People who: Are overweight. Work in jobs that require long periods of standing or heavy lifting. Have had an inguinal hernia before. Smoke or have lung disease. These factors can lead to long-term (chronic) coughing. What are the signs or symptoms? Symptoms may depend on the size of the hernia. Often, a small hernia has no symptoms. Symptoms of a larger hernia may include: A bulge in the groin area. This is easier to see when standing. You might not be able to see it when you are lying down. Pain or burning in the groin. This may get worse when you lift, strain, or cough. A dull ache or a feeling of pressure in the groin. An abnormal bulge in the scrotum, in males. Symptoms of a strangulated inguinal hernia may  include: A bulge in your groin that is very painful and tender to the touch. A bulge that turns red or purple. Fever, feeling like you may vomit (nausea), and vomiting. Not being able to poop or to pass gas. How is this treated? Treatment depends on the size of your hernia and whether you have symptoms. If you do not have symptoms, your doctor may have you watch your hernia carefully and have you come in for follow-up visits. If your hernia is large or if you have symptoms, you may need surgery to repair the hernia. Follow these instructions at home: Lifestyle Avoid lifting heavy objects. Avoid standing for long amounts of time. Do not smoke or use any products that contain nicotine or tobacco. If you need help quitting, ask your doctor. Stay at a healthy weight. Prevent trouble pooping You may need to take these actions to prevent or treat trouble pooping: Drink enough fluid to keep your pee (urine) pale yellow. Take over-the-counter or prescription medicines. Eat foods that are high in fiber. These include beans, whole grains, and fresh fruits and vegetables. Limit foods that are high in fat and sugar. These include fried or sweet foods. General instructions You may try to push your hernia back in place by very gently pressing on it when you are lying down. Do not try to push the bulge back in if it will not go in easily. Watch your hernia for any changes in shape, size, or color. Tell your doctor if   you see any changes. Take over-the-counter and prescription medicines only as told by your doctor. Keep all follow-up visits. Contact a doctor if: You have a fever or chills. You have new symptoms. Your symptoms get worse. Get help right away if: You have pain in your groin that gets worse all of a sudden. You have a bulge in your groin that: Gets bigger all of a sudden, and it does not get smaller after that. Turns red or purple. Is painful when you touch it. You are a male, and you  have: Sudden pain in your scrotum. A sudden change in the size of your scrotum. You cannot push the hernia back in place by very gently pressing on it when you are lying down. You feel like you may vomit, and that feeling does not go away. You keep vomiting. You have a fast heartbeat. You cannot poop or pass gas. These symptoms may be an emergency. Get help right away. Call your local emergency services (911 in the [place] Do not wait to see if the symptoms will go away. Do not drive yourself to the hospital. Summary An inguinal hernia is when fat or your intestines push through a weak spot in a muscle where your leg meets your lower belly (groin). This causes a bulge. If you do not have symptoms, you may not need treatment. If you have symptoms or a large hernia, you may need surgery. Avoid lifting heavy objects. Also, avoid standing for long amounts of time. Do not try to push the bulge back in if it will not go in easily. This information is not intended to replace advice given to you by your health care provider. Make sure you discuss any questions you have with your health care provider. Document Revised: 04/20/2020 Document Reviewed: 04/20/2020 Elsevier Patient Education  2023 Elsevier Inc.  

## 2022-01-11 ENCOUNTER — Telehealth: Payer: Self-pay | Admitting: Surgery

## 2022-01-11 NOTE — Telephone Encounter (Signed)
Patient and wife have been advised of Pre-Admission date/time, COVID Testing date and Surgery date. ? ?Surgery Date: 01/16/22 ?Preadmission Testing Date: 01/12/22 (phone 8a-1p) ?Covid Testing Date: Not needed.  ? ?Patient has been made aware to call [phone], between 1-3:00pm the day before surgery, to find out what time to arrive for surgery.   ? ?

## 2022-01-12 ENCOUNTER — Encounter
Admission: RE | Admit: 2022-01-12 | Discharge: 2022-01-12 | Disposition: A | Discharge status: Home / Self Care | Payer: Medicare Other | Source: Ambulatory Visit | Attending: Surgery | Admitting: Surgery

## 2022-01-12 ENCOUNTER — Other Ambulatory Visit: Payer: Self-pay

## 2022-01-12 VITALS — Ht 68.0 in | Wt 172.0 lb

## 2022-01-12 DIAGNOSIS — E118 Type 2 diabetes mellitus with unspecified complications: Secondary | ICD-10-CM

## 2022-01-12 DIAGNOSIS — K4090 Unilateral inguinal hernia, without obstruction or gangrene, not specified as recurrent: Secondary | ICD-10-CM

## 2022-01-12 NOTE — Patient Instructions (Addendum)
Your procedure is scheduled on: 5/15/ 2023  ?Report to the Registration Desk on the 1st floor of the [facility]. ?To find out your arrival time, please call [phone] between 1PM - 3PM on: 01/15/2022  ?If your arrival time is 6:00 am, do not arrive prior to that time as the [facility] entrance doors do not open until 6:00 am. ? ?REMEMBER: ?Instructions that are not followed completely may result in serious medical risk, up to and including death; or upon the discretion of your surgeon and anesthesiologist your surgery may need to be rescheduled. ? ?Do not eat food or drink after midnight the night before surgery.  ?No gum chewing, lozengers or hard candies. ? ? ?TAKE THESE MEDICATIONS THE MORNING OF SURGERY WITH A SIP OF WATER: ?Omeprazole-(take one the night before and one on the morning of surgery - helps to prevent nausea after surgery.) ? ? ?**Follow new guidelines for insulin and diabetes medications.**  ? ?Do not take metformin on 5/13,5/14, 5/15 ? ? ?One week prior to surgery: ?Stop Anti-inflammatories (NSAIDS) such as Advil, Aleve, Ibuprofen, Motrin, Naproxen, Naprosyn and Aspirin based products such as Excedrin, Goodys Powder, BC Powder. ?Stop ANY OVER THE COUNTER supplements until after surgery like B-12  ? ?Per our conversation, because you are only taking aspirin for pain and not for other indication, please hold off on aspirin.  ?You may however, continue to take Tylenol if needed for pain up until the day of surgery. ? ?No Alcohol for 24 hours before or after surgery.  ? ? ?On the morning of surgery brush your teeth with toothpaste and water, you may rinse your mouth with mouthwash if you wish. ? ?Do not swallow any toothpaste or mouthwash. ? ?Use CHG Soap with shower as directed on instruction sheet.-provided for you  ? ?Do not wear jewelry, make-up, hairpins, clips or nail polish. ? ?Do not wear lotions, powders, or perfumes.  ? ?Do not shave body from the neck down 48 hours prior to surgery  just in case you cut yourself which could leave a site for infection.  ?Also, freshly shaved skin may become irritated if using the CHG soap. ? ?Contact lenses, hearing aids and dentures may not be worn into surgery.  ? ?Do not bring valuables to the hospital. [facility] is not responsible for any missing/lost belongings or valuables.  ? ? ?Notify your doctor if there is any change in your medical condition (cold, fever, infection). ? ?Wear comfortable clothing (specific to your surgery type) to the hospital. ? ?After surgery, you can help prevent lung complications by doing breathing exercises.  ?Take deep breaths and cough every 1-2 hours. Your doctor may order a device called an Incentive Spirometer to help you take deep breaths. ? ?If you are being admitted to the hospital overnight, leave your suitcase in the car. ?After surgery it may be brought to your room. ? ?If you are being discharged the day of surgery, you will not be allowed to drive home. ?You will need a responsible adult (18 years or older) to drive you home and stay with you that night.  ? ?If you are taking public transportation, you will need to have a responsible adult (18 years or older) with you. ?Please confirm with your physician that it is acceptable to use public transportation.  ? ?Please call the [facility] at [phone] if you have any questions about these instructions. ? ?Surgery Visitation Policy: ? ?Patients undergoing a surgery or procedure  may have two family members or support persons with them as long as the person is not COVID-19 positive or experiencing its symptoms.  ? ?Inpatient Visitation:   ? ?Visiting hours are 7 a.m. to 8 p.m. ?Up to four visitors are allowed at one time in a patient room, including children. The visitors may rotate out with other people during the day. One designated support person (adult) may remain overnight.  ?

## 2022-01-13 ENCOUNTER — Encounter: Payer: Self-pay | Admitting: Urgent Care

## 2022-01-13 ENCOUNTER — Encounter
Admission: RE | Admit: 2022-01-13 | Discharge: 2022-01-13 | Disposition: A | Discharge status: Home / Self Care | Payer: Medicare Other | Source: Ambulatory Visit | Attending: Surgery | Admitting: Surgery

## 2022-01-13 ENCOUNTER — Other Ambulatory Visit: Payer: Self-pay | Admitting: Internal Medicine

## 2022-01-13 DIAGNOSIS — K4090 Unilateral inguinal hernia, without obstruction or gangrene, not specified as recurrent: Secondary | ICD-10-CM | POA: Insufficient documentation

## 2022-01-13 DIAGNOSIS — E119 Type 2 diabetes mellitus without complications: Secondary | ICD-10-CM

## 2022-01-13 DIAGNOSIS — Z01810 Encounter for preprocedural cardiovascular examination: Secondary | ICD-10-CM | POA: Diagnosis not present

## 2022-01-13 NOTE — Telephone Encounter (Signed)
Requested Prescriptions  ?Pending Prescriptions Disp Refills  ?? metFORMIN (GLUCOPHAGE) 1000 MG tablet [Pharmacy Med Name: METFORMIN HCL 1,000 MG TABLET] 180 tablet 0  ?  Sig: TAKE 1 TABLET BY MOUTH TWICE A DAY  ?  ? Endocrinology:  Diabetes - Biguanides Failed - 01/13/2022  2:30 AM  ?  ?  Failed - B12 Level in normal range and within 720 days  ?  Vitamin B-12  ?Date Value Ref Range Status  ?01/09/2022 >2000 (H) 232 - 1245 pg/mL Final  ?   ?  ?  Failed - CBC within normal limits and completed in the last 12 months  ?  WBC  ?Date Value Ref Range Status  ?01/09/2022 7.0 3.4 - 10.8 x10E3/uL Final  ?01/05/2021 8.1 4.0 - 10.5 K/uL Final  ? ?RBC  ?Date Value Ref Range Status  ?01/09/2022 3.75 (L) 4.14 - 5.80 x10E6/uL Final  ?01/05/2021 3.99 (L) 4.22 - 5.81 MIL/uL Final  ? ?Hemoglobin  ?Date Value Ref Range Status  ?01/09/2022 12.5 (L) 13.0 - 17.7 g/dL Final  ? ?Hematocrit  ?Date Value Ref Range Status  ?01/09/2022 37.1 (L) 37.5 - 51.0 % Final  ? ?MCHC  ?Date Value Ref Range Status  ?01/09/2022 33.7 31.5 - 35.7 g/dL Final  ?01/05/2021 33.6 30.0 - 36.0 g/dL Final  ? ?MCH  ?Date Value Ref Range Status  ?01/09/2022 33.3 (H) 26.6 - 33.0 pg Final  ?01/05/2021 33.1 26.0 - 34.0 pg Final  ? ?MCV  ?Date Value Ref Range Status  ?01/09/2022 99 (H) 79 - 97 fL Final  ?08/24/2013 98 80 - 100 fL Final  ? ?No results found for: PLTCOUNTKUC, LABPLAT, [place] ?RDW  ?Date Value Ref Range Status  ?01/09/2022 12.1 11.6 - 15.4 % Final  ?08/24/2013 12.9 11.5 - 14.5 % Final  ? ?  ?  ?  Passed - Cr in normal range and within 360 days  ?  Creatinine  ?Date Value Ref Range Status  ?08/24/2013 0.76 0.60 - 1.30 mg/dL Final  ? ?Creatinine, Ser  ?Date Value Ref Range Status  ?10/12/2021 0.92 0.76 - 1.27 mg/dL Final  ?   ?  ?  Passed - HBA1C is between 0 and 7.9 and within 180 days  ?  Hemoglobin A1C  ?Date Value Ref Range Status  ?10/12/2021 5.6 4.0 - 5.6 % Corrected  ?01/19/2021 5.9  Final  ?   ?  ?  Passed - eGFR in normal range and within 360 days  ?   EGFR (African American)  ?Date Value Ref Range Status  ?08/24/2013 >60  Final  ? ?GFR calc Af Wyvonnia Lora  ?Date Value Ref Range Status  ?08/16/2020 84 >59 mL/min/1.73 Final  ?  Comment:  ?  **In accordance with recommendations from the NKF-ASN Task force,** ?  [facility] is in the process of updating its eGFR calculation to the ?  2021 CKD-EPI creatinine equation that estimates kidney function ?  without a race variable. ?  ? ?EGFR (Non-African Amer.)  ?Date Value Ref Range Status  ?08/24/2013 >60  Final  ?  Comment:  ?  eGFR values <7m/min/1.73 m2 may be an indication of chronic ?kidney disease (CKD). ?Calculated eGFR is useful in patients with stable renal function. ?The eGFR calculation will not be reliable in acutely ill patients ?when serum creatinine is changing rapidly. It is not useful in  ?patients on dialysis. The eGFR calculation may not be applicable ?to patients at the low and high extremes of body sizes, pregnant ?women, and vegetarians. ?  POTASSIUM - Slight hemolysis, interpret results with ? - caution. ?  ? ?GFR, Estimated  ?Date Value Ref Range Status  ?01/05/2021 58 (L) >60 mL/min Final  ?  Comment:  ?  (NOTE) ?Calculated using the CKD-EPI Creatinine Equation (2021) ?  ? ?eGFR  ?Date Value Ref Range Status  ?10/12/2021 85 >59 mL/min/1.73 Final  ?   ?  ?  Passed - Valid encounter within last 6 months  ?  Recent Outpatient Visits   ?      ? 4 days ago Type II diabetes mellitus with complication ([place])  ? [facility] Glean Hess, MD  ? 3 months ago Essential hypertension  ? [facility] Glean Hess, MD  ? 6 months ago Diarrhea, unspecified type  ? [facility] Glean Hess, MD  ? 11 months ago Urinary frequency  ? [facility] Glean Hess, MD  ? 1 year ago Frequent urination  ? [facility] Juline Patch, MD  ?  ?  ?Future Appointments   ?        ? In 3 months [facility] Melia Jesse Sans, MD [facility] [place]  ?  ? ?  ?  ?  ? ? ? ?

## 2022-01-14 NOTE — Anesthesia Preprocedure Evaluation (Addendum)
Anesthesia Evaluation  ?Patient identified by MRN, date of birth, ID band ?Patient awake ? ? ? ?Reviewed: ?Allergy & Precautions, NPO status , Patient's Chart, lab work & pertinent test results ? ?History of Anesthesia Complications ?Negative for: history of anesthetic complications ? ?Airway ?Mallampati: II ? ?TM Distance: >3 FB ?Neck ROM: Full ? ? ? Dental ? ?(+) Upper Dentures, Missing ?  ?Pulmonary ?sleep apnea (diagnosed in the past but then retested and was negative) , former smoker,  ?  ?breath sounds clear to auscultation- rhonchi ?(-) wheezing ? ? ? ? ? Cardiovascular ?Exercise Tolerance: Poor ?(-) CAD, (-) Past MI, (-) Cardiac Stents, (-) CABG, (-) Orthopnea and (-) DOE  ?Rhythm:Regular Rate:Normal ?- Systolic murmurs and - Diastolic murmurs ?Normal EKG 5/23 ?  ?Neuro/Psych ?neg Seizures negative neurological ROS ? negative psych ROS  ? GI/Hepatic ?Neg liver ROS, hiatal hernia (small from last EGD 2018), GERD  Controlled and Medicated,  ?Endo/Other  ?diabetes, Well Controlled, Type 2, Oral Hypoglycemic Agents ? Renal/GU ?negative Renal ROS  ? ?  ?Musculoskeletal ? ?(+) Arthritis ,  ? Abdominal ?Normal abdominal exam  (+)   ?Peds ? Hematology ?negative hematology ROS ?(+)   ?Anesthesia Other Findings ?Past Medical History: ?No date: Arthritis ?No date: Cancer [facility]) ?No date: Diabetes mellitus without complication ([place]) ?No date: GERD (gastroesophageal reflux disease) ?No date: Haglund's deformity ?No date: History of kidney stones ?No date: Hyperlipidemia ?No date: Hypertension ?02/05/2017: Intractable hiccups ?No date: Prostate CA [facility]) ?No date: Sleep apnea ? ? Reproductive/Obstetrics ? ?  ? ? ? ? ? ? ? ? ? ? ? ? ? ?  ?  ? ? ? ? ? ? ? ?Anesthesia Physical ? ?Anesthesia Plan ? ?ASA: 2 ? ?Anesthesia Plan: General  ? ?Post-op Pain Management: Tylenol PO (pre-op)* and Celebrex PO (pre-op)*  ? ?Induction: Intravenous ? ?PONV Risk Score and Plan: 1 and Ondansetron, Dexamethasone  and Treatment may vary due to age or medical condition ? ?Airway Management Planned: Oral ETT ? ?Additional Equipment:  ? ?Intra-op Plan:  ? ?Post-operative Plan: Extubation in OR ? ?Informed Consent: I have reviewed the patients History and Physical, chart, labs and discussed the procedure including the risks, benefits and alternatives for the proposed anesthesia with the patient or authorized representative who has indicated his/her understanding and acceptance.  ? ? ? ?Dental advisory given ? ?Plan Discussed with: CRNA and Anesthesiologist ? ?Anesthesia Plan Comments:   ? ? ? ? ? ?Anesthesia Quick Evaluation ? ?

## 2022-01-16 ENCOUNTER — Ambulatory Visit
Admission: RE | Admit: 2022-01-16 | Discharge: 2022-01-16 | Disposition: A | Discharge status: Home / Self Care | Payer: Medicare Other | Attending: Surgery | Admitting: Surgery

## 2022-01-16 ENCOUNTER — Other Ambulatory Visit: Payer: Self-pay

## 2022-01-16 ENCOUNTER — Encounter
Admission: RE | Disposition: A | Discharge status: Home / Self Care | Payer: Self-pay | Source: Home / Self Care | Attending: Surgery

## 2022-01-16 ENCOUNTER — Encounter: Payer: Self-pay | Admitting: Surgery

## 2022-01-16 ENCOUNTER — Ambulatory Visit: Payer: Medicare Other | Admitting: Anesthesiology

## 2022-01-16 DIAGNOSIS — Z87891 Personal history of nicotine dependence: Secondary | ICD-10-CM | POA: Diagnosis not present

## 2022-01-16 DIAGNOSIS — C61 Malignant neoplasm of prostate: Secondary | ICD-10-CM | POA: Diagnosis not present

## 2022-01-16 DIAGNOSIS — Z7984 Long term (current) use of oral hypoglycemic drugs: Secondary | ICD-10-CM | POA: Insufficient documentation

## 2022-01-16 DIAGNOSIS — K4090 Unilateral inguinal hernia, without obstruction or gangrene, not specified as recurrent: Secondary | ICD-10-CM

## 2022-01-16 DIAGNOSIS — E119 Type 2 diabetes mellitus without complications: Secondary | ICD-10-CM | POA: Diagnosis not present

## 2022-01-16 DIAGNOSIS — K219 Gastro-esophageal reflux disease without esophagitis: Secondary | ICD-10-CM | POA: Insufficient documentation

## 2022-01-16 DIAGNOSIS — E118 Type 2 diabetes mellitus with unspecified complications: Secondary | ICD-10-CM

## 2022-01-16 HISTORY — PX: INSERTION OF MESH: SHX5868

## 2022-01-16 LAB — GLUCOSE, CAPILLARY
Glucose-Capillary: 130 mg/dL — ABNORMAL HIGH (ref 70–99)
Glucose-Capillary: 163 mg/dL — ABNORMAL HIGH (ref 70–99)

## 2022-01-16 SURGERY — HERNIORRHAPHY, INGUINAL, ROBOT-ASSISTED, LAPAROSCOPIC
Anesthesia: General | Site: Inguinal | Laterality: Right

## 2022-01-16 MED ORDER — CHLORHEXIDINE GLUCONATE 0.12 % MT SOLN
15.0000 mL | Freq: Once | OROMUCOSAL | Status: AC
  Administered 2022-01-16: 15 mL via OROMUCOSAL

## 2022-01-16 MED ORDER — GABAPENTIN 300 MG PO CAPS: 300.0000 mg | ORAL_CAPSULE | ORAL | Status: AC

## 2022-01-16 MED ORDER — CELECOXIB 200 MG PO CAPS
ORAL_CAPSULE | ORAL | Status: AC
  Administered 2022-01-16: 200 mg via ORAL
  Filled 2022-01-16: qty 1

## 2022-01-16 MED ORDER — OXYCODONE HCL 5 MG PO TABS: 5.0000 mg | ORAL_TABLET | Freq: Four times a day (QID) | ORAL | 0 refills | Status: DC | PRN

## 2022-01-16 MED ORDER — ONDANSETRON HCL 4 MG/2ML IJ SOLN
INTRAMUSCULAR | Status: AC
  Filled 2022-01-16: qty 2

## 2022-01-16 MED ORDER — ACETAMINOPHEN 500 MG PO TABS: 1000.0000 mg | ORAL_TABLET | ORAL | Status: AC

## 2022-01-16 MED ORDER — PROPOFOL 10 MG/ML IV BOLUS
INTRAVENOUS | Status: AC
  Filled 2022-01-16: qty 20

## 2022-01-16 MED ORDER — CELECOXIB 200 MG PO CAPS: 200.0000 mg | ORAL_CAPSULE | ORAL | Status: AC

## 2022-01-16 MED ORDER — FENTANYL CITRATE (PF) 100 MCG/2ML IJ SOLN
25.0000 ug | INTRAMUSCULAR | Status: DC | PRN
  Administered 2022-01-16: 25 ug via INTRAVENOUS

## 2022-01-16 MED ORDER — CHLORHEXIDINE GLUCONATE CLOTH 2 % EX PADS
6.0000 | MEDICATED_PAD | Freq: Once | CUTANEOUS | Status: AC
  Administered 2022-01-16: 6 via TOPICAL

## 2022-01-16 MED ORDER — PROMETHAZINE HCL 25 MG/ML IJ SOLN: 6.2500 mg | INTRAMUSCULAR | Status: DC | PRN

## 2022-01-16 MED ORDER — DEXMEDETOMIDINE HCL IN NACL 200 MCG/50ML IV SOLN
INTRAVENOUS | Status: DC | PRN
  Administered 2022-01-16: 8 ug via INTRAVENOUS
  Administered 2022-01-16: 12 ug via INTRAVENOUS

## 2022-01-16 MED ORDER — PROPOFOL 10 MG/ML IV BOLUS
INTRAVENOUS | Status: DC | PRN
  Administered 2022-01-16: 100 mg via INTRAVENOUS

## 2022-01-16 MED ORDER — ACETAMINOPHEN 10 MG/ML IV SOLN: 1000.0000 mg | Freq: Once | INTRAVENOUS | Status: DC | PRN

## 2022-01-16 MED ORDER — CEFAZOLIN SODIUM-DEXTROSE 2-4 GM/100ML-% IV SOLN
2.0000 g | INTRAVENOUS | Status: AC
  Administered 2022-01-16: 2 g via INTRAVENOUS

## 2022-01-16 MED ORDER — FENTANYL CITRATE (PF) 100 MCG/2ML IJ SOLN
INTRAMUSCULAR | Status: DC | PRN
  Administered 2022-01-16: 50 ug via INTRAVENOUS
  Administered 2022-01-16 (×2): 25 ug via INTRAVENOUS

## 2022-01-16 MED ORDER — ORAL CARE MOUTH RINSE: 15.0000 mL | Freq: Once | OROMUCOSAL | Status: AC

## 2022-01-16 MED ORDER — SODIUM CHLORIDE 0.9 % IV SOLN: INTRAVENOUS | Status: DC | PRN

## 2022-01-16 MED ORDER — IBUPROFEN 800 MG PO TABS: 800.0000 mg | ORAL_TABLET | Freq: Three times a day (TID) | ORAL | 0 refills | Status: DC | PRN

## 2022-01-16 MED ORDER — DEXAMETHASONE SODIUM PHOSPHATE 10 MG/ML IJ SOLN
INTRAMUSCULAR | Status: AC
  Filled 2022-01-16: qty 1

## 2022-01-16 MED ORDER — ROCURONIUM BROMIDE 10 MG/ML (PF) SYRINGE
PREFILLED_SYRINGE | INTRAVENOUS | Status: AC
  Filled 2022-01-16: qty 10

## 2022-01-16 MED ORDER — CEFAZOLIN SODIUM-DEXTROSE 2-4 GM/100ML-% IV SOLN
INTRAVENOUS | Status: AC
  Filled 2022-01-16: qty 100

## 2022-01-16 MED ORDER — SODIUM CHLORIDE 0.9 % IV SOLN: INTRAVENOUS | Status: DC

## 2022-01-16 MED ORDER — PHENYLEPHRINE HCL-NACL 20-0.9 MG/250ML-% IV SOLN
INTRAVENOUS | Status: DC | PRN
  Administered 2022-01-16: 50 ug/min via INTRAVENOUS

## 2022-01-16 MED ORDER — STERILE WATER FOR IRRIGATION IR SOLN
Status: DC | PRN
  Administered 2022-01-16: 500 mL

## 2022-01-16 MED ORDER — GABAPENTIN 300 MG PO CAPS
ORAL_CAPSULE | ORAL | Status: AC
  Administered 2022-01-16: 300 mg via ORAL
  Filled 2022-01-16: qty 1

## 2022-01-16 MED ORDER — SUGAMMADEX SODIUM 200 MG/2ML IV SOLN
INTRAVENOUS | Status: DC | PRN
  Administered 2022-01-16: 200 mg via INTRAVENOUS

## 2022-01-16 MED ORDER — FENTANYL CITRATE (PF) 100 MCG/2ML IJ SOLN
INTRAMUSCULAR | Status: AC
  Filled 2022-01-16: qty 2

## 2022-01-16 MED ORDER — PHENYLEPHRINE HCL-NACL 20-0.9 MG/250ML-% IV SOLN
INTRAVENOUS | Status: AC
  Filled 2022-01-16: qty 250

## 2022-01-16 MED ORDER — BUPIVACAINE-EPINEPHRINE (PF) 0.25% -1:200000 IJ SOLN
INTRAMUSCULAR | Status: DC | PRN
  Administered 2022-01-16: 24 mL

## 2022-01-16 MED ORDER — BUPIVACAINE LIPOSOME 1.3 % IJ SUSP: 20.0000 mL | Freq: Once | INTRAMUSCULAR | Status: DC

## 2022-01-16 MED ORDER — OXYCODONE HCL 5 MG PO TABS: 5.0000 mg | ORAL_TABLET | Freq: Once | ORAL | Status: DC | PRN

## 2022-01-16 MED ORDER — ONDANSETRON HCL 4 MG/2ML IJ SOLN
INTRAMUSCULAR | Status: DC | PRN
  Administered 2022-01-16: 4 mg via INTRAVENOUS

## 2022-01-16 MED ORDER — FAMOTIDINE 20 MG PO TABS
ORAL_TABLET | ORAL | Status: AC
  Filled 2022-01-16: qty 1

## 2022-01-16 MED ORDER — OXYCODONE HCL 5 MG/5ML PO SOLN: 5.0000 mg | Freq: Once | ORAL | Status: DC | PRN

## 2022-01-16 MED ORDER — CHLORHEXIDINE GLUCONATE 0.12 % MT SOLN
OROMUCOSAL | Status: AC
  Filled 2022-01-16: qty 15

## 2022-01-16 MED ORDER — EPHEDRINE SULFATE (PRESSORS) 50 MG/ML IJ SOLN
INTRAMUSCULAR | Status: DC | PRN
  Administered 2022-01-16: 10 mg via INTRAVENOUS

## 2022-01-16 MED ORDER — LIDOCAINE HCL (CARDIAC) PF 100 MG/5ML IV SOSY
PREFILLED_SYRINGE | INTRAVENOUS | Status: DC | PRN
  Administered 2022-01-16: 100 mg via INTRAVENOUS

## 2022-01-16 MED ORDER — ROCURONIUM BROMIDE 100 MG/10ML IV SOLN
INTRAVENOUS | Status: DC | PRN
  Administered 2022-01-16: 10 mg via INTRAVENOUS
  Administered 2022-01-16: 50 mg via INTRAVENOUS
  Administered 2022-01-16: 20 mg via INTRAVENOUS

## 2022-01-16 MED ORDER — BUPIVACAINE-EPINEPHRINE (PF) 0.25% -1:200000 IJ SOLN
INTRAMUSCULAR | Status: AC
  Filled 2022-01-16: qty 30

## 2022-01-16 MED ORDER — ACETAMINOPHEN 500 MG PO TABS
ORAL_TABLET | ORAL | Status: AC
  Administered 2022-01-16: 1000 mg via ORAL
  Filled 2022-01-16: qty 2

## 2022-01-16 MED ORDER — LIDOCAINE HCL (PF) 2 % IJ SOLN
INTRAMUSCULAR | Status: AC
  Filled 2022-01-16: qty 5

## 2022-01-16 SURGICAL SUPPLY — 56 items
ADH SKN CLS APL DERMABOND .7 (GAUZE/BANDAGES/DRESSINGS) ×2
BLADE CLIPPER SURG (BLADE) ×3 IMPLANT
COVER LIGHT HANDLE STERIS (MISCELLANEOUS) ×1 IMPLANT
COVER TIP SHEARS 8 DVNC (MISCELLANEOUS) ×2 IMPLANT
COVER TIP SHEARS 8MM DA VINCI (MISCELLANEOUS) ×1
COVER WAND RF STERILE (DRAPES) ×3 IMPLANT
DERMABOND ADVANCED (GAUZE/BANDAGES/DRESSINGS) ×1
DERMABOND ADVANCED .7 DNX12 (GAUZE/BANDAGES/DRESSINGS) ×2 IMPLANT
DRAPE ARM DVNC X/XI (DISPOSABLE) ×6 IMPLANT
DRAPE COLUMN DVNC XI (DISPOSABLE) ×2 IMPLANT
DRAPE DA VINCI XI ARM (DISPOSABLE) ×3
DRAPE DA VINCI XI COLUMN (DISPOSABLE) ×1
ELECT REM PT RETURN 9FT ADLT (ELECTROSURGICAL) ×3
ELECTRODE REM PT RTRN 9FT ADLT (ELECTROSURGICAL) ×2 IMPLANT
GLOVE ORTHO TXT STRL SZ7.5 (GLOVE) ×9 IMPLANT
GOWN STRL REUS W/ TWL LRG LVL3 (GOWN DISPOSABLE) ×2 IMPLANT
GOWN STRL REUS W/ TWL XL LVL3 (GOWN DISPOSABLE) ×4 IMPLANT
GOWN STRL REUS W/TWL LRG LVL3 (GOWN DISPOSABLE) ×3
GOWN STRL REUS W/TWL XL LVL3 (GOWN DISPOSABLE) ×6
GRASPER SUT TROCAR 14GX15 (MISCELLANEOUS) IMPLANT
IRRIGATION STRYKERFLOW (MISCELLANEOUS) IMPLANT
IRRIGATOR STRYKERFLOW (MISCELLANEOUS)
IV CATH ANGIO 14GX1.88 NO SAFE (IV SOLUTION) ×3 IMPLANT
IV NS 1000ML (IV SOLUTION)
IV NS 1000ML BAXH (IV SOLUTION) IMPLANT
KIT PINK PAD W/HEAD ARE REST (MISCELLANEOUS) ×3
KIT PINK PAD W/HEAD ARM REST (MISCELLANEOUS) ×2 IMPLANT
LABEL OR SOLS (LABEL) ×3 IMPLANT
MANIFOLD NEPTUNE II (INSTRUMENTS) ×3 IMPLANT
MESH 3DMAX LIGHT 4.1X6.2 RT LR (Mesh General) ×1 IMPLANT
NDL INSUFFLATION 14GA 120MM (NEEDLE) IMPLANT
NEEDLE HYPO 22GX1.5 SAFETY (NEEDLE) ×3 IMPLANT
NEEDLE INSUFFLATION 14GA 120MM (NEEDLE) IMPLANT
OBTURATOR OPTICAL STANDARD 8MM (TROCAR) ×1
OBTURATOR OPTICAL STND 8 DVNC (TROCAR) ×2
OBTURATOR OPTICALSTD 8 DVNC (TROCAR) IMPLANT
PACK LAP CHOLECYSTECTOMY (MISCELLANEOUS) ×3 IMPLANT
SEAL CANN UNIV 5-8 DVNC XI (MISCELLANEOUS) ×6 IMPLANT
SEAL XI 5MM-8MM UNIVERSAL (MISCELLANEOUS) ×3
SET TUBE SMOKE EVAC HIGH FLOW (TUBING) ×3 IMPLANT
SOLUTION ELECTROLUBE (MISCELLANEOUS) ×3 IMPLANT
SUT MNCRL 4-0 (SUTURE) ×3
SUT MNCRL 4-0 27XMFL (SUTURE) ×2
SUT STRATAFIX 0 PDS+ CT-2 23 (SUTURE)
SUT V-LOC 90 ABS 3-0 VLT  V-20 (SUTURE)
SUT V-LOC 90 ABS 3-0 VLT V-20 (SUTURE) IMPLANT
SUT VIC AB 0 CT2 27 (SUTURE) ×4 IMPLANT
SUT VIC AB 2-0 RB1 27 (SUTURE) ×3 IMPLANT
SUT VLOC 90 2/L VL 12 GS22 (SUTURE) IMPLANT
SUT VLOC 90 S/L VL9 GS22 (SUTURE) IMPLANT
SUTURE MNCRL 4-0 27XMF (SUTURE) ×2 IMPLANT
SUTURE STRATFX 0 PDS+ CT-2 23 (SUTURE) IMPLANT
TAPE TRANSPORE STRL 2 31045 (GAUZE/BANDAGES/DRESSINGS) ×1 IMPLANT
TRAY FOLEY MTR SLVR 16FR STAT (SET/KITS/TRAYS/PACK) ×1 IMPLANT
TROCAR Z-THREAD FIOS 11X100 BL (TROCAR) IMPLANT
WATER STERILE IRR 500ML POUR (IV SOLUTION) ×3 IMPLANT

## 2022-01-16 NOTE — Anesthesia Procedure Notes (Signed)
Procedure Name: Intubation ?Date/Time: 01/16/2022 2:39 PM ?Performed by: Kerri Perches, CRNA ?Pre-anesthesia Checklist: Patient identified, Patient being monitored, Timeout performed, Emergency Drugs available and Suction available ?Patient Re-evaluated:Patient Re-evaluated prior to induction ?Oxygen Delivery Method: Circle system utilized ?Preoxygenation: Pre-oxygenation with 100% oxygen ?Induction Type: IV induction ?Ventilation: Mask ventilation without difficulty ?Laryngoscope Size: 3 and McGraph ?Grade View: Grade I ?Tube type: Oral ?Tube size: 7.0 mm ?Number of attempts: 1 ?Airway Equipment and Method: Stylet ?Placement Confirmation: ETT inserted through vocal cords under direct vision, positive ETCO2 and breath sounds checked- equal and bilateral ?Secured at: 21 cm ?Tube secured with: Tape ?Dental Injury: Teeth and Oropharynx as per pre-operative assessment  ? ? ? ? ?

## 2022-01-16 NOTE — Op Note (Signed)
Robotic assisted Laparoscopic Transabdominal right inguinal Hernia Repair with Mesh ?  ?  ?  ?Pre-operative Diagnosis: Right inguinal Hernia ?  ?Post-operative Diagnosis: Same ?  ?Procedure: Robotic assisted Laparoscopic  repair of right inguinal hernia(s) ?  ?Surgeon: [name], M.D., FACS ?  ?Anesthesia: GETA ?  ?Findings: Right inguinal hernia, no evidence of left sided hernia.       ?  ?Procedure Details  ?The patient was seen again in the Holding Room. The benefits, complications, treatment options, and expected outcomes were discussed with the patient. The risks of bleeding, infection, recurrence of symptoms, failure to resolve symptoms, recurrence of hernia, ischemic orchitis, chronic pain syndrome or neuroma, were reviewed again. The likelihood of improving the patient's symptoms with return to their baseline status is good.  The patient and/or family concurred with the proposed plan, giving informed consent.  The patient was taken to Operating Room, identified  and the procedure verified as Laparoscopic Inguinal Hernia Repair. Laterality confirmed. ? A Time Out was held and the above information confirmed. ?  ?Prior to the induction of general anesthesia, antibiotic prophylaxis was administered. VTE prophylaxis was in place. General endotracheal anesthesia was then administered and tolerated well. After the induction, the abdomen was prepped with Chloraprep and draped in the sterile fashion. The patient was positioned in the supine position. ?  ?After local infiltration of quarter percent Marcaine with epinephrine, stab incision was made left upper quadrant.  On the left at Palmer's point, the Veress needle is passed with sensation of the layers to penetrate the abdominal wall and into the peritoneum.  Saline drop test is confirmed peritoneal placement.  Insufflation is initiated with carbon dioxide to pressures of 15 mmHg. ?An 8.5 mm port is placed to the left off of the midline, with blunt tipped  trocar.  ?Pneumoperitoneum maintained w/o HD changes using the AirSeal to pressures of 15 mm Hg with CO2. No evidence of bowel injuries.  ?Two 8.5 mm ports placed under direct vision in each upper quadrant. The laparoscopy revealed a right indirect defect(s).   ?Under direct visualization I placed the Veress needle into the preperitoneal space and pre- insufflated this space.  The Veress needle was then kept in position for later decompression of the space upon closure. ?The robot was brought ot the table and docked in the standard fashion, no collision between arms was observed. Instruments were kept under direct view at all times. ?For right inguinal hernia repair,  I developed a peritoneal flap. The sac(s) were reduced and dissected free from adjacent structures. We preserved the vas and the vessels, and visualized them to their convergence and beyond in the retroperitoneum. Once dissection was completed a large right sided BARD 3D Light mesh was placed and secured at three points with interrupted 0 Vicryl to the pubic tubercle and anteriorly. ?There was good coverage of the direct, indirect and femoral spaces. ? ?Second look revealed no complications or injuries. ?The flap was then closed with 2-0 V-lock suture.  Peritoneal closure without defects.  The Veress' valve was released allowing extraperitoneal CO2 to escape, it was also used to access the space for supplemental local anesthesia. ?Once assuring that hemostasis was adequate, all needles/sponges removed, and the robot was undocked.  ?The ports were removed, the abdomen desulflated.  4-0 subcuticular Monocryl was used at all skin edges. Dermabond was placed.  ?Patient tolerated the procedure well. There were no complications. He was taken to the recovery room in stable condition.  ?  ? ?      ?  [name], M.D., FACS ?01/16/2022, 4:36 PM  ?

## 2022-01-16 NOTE — Anesthesia Postprocedure Evaluation (Signed)
Anesthesia Post Note ? ?Patient: CHAYIM BIALAS ? ?Procedure(s) Performed: XI ROBOTIC ASSISTED INGUINAL HERNIA (Right: Abdomen) ?INSERTION OF MESH (Right: Inguinal) ? ?Patient location during evaluation: PACU ?Anesthesia Type: General ?Level of consciousness: awake and alert ?Pain management: pain level controlled ?Vital Signs Assessment: post-procedure vital signs reviewed and stable ?Respiratory status: spontaneous breathing, nonlabored ventilation, respiratory function stable and patient connected to nasal cannula oxygen ?Cardiovascular status: blood pressure returned to baseline and stable ?Postop Assessment: no apparent nausea or vomiting ?Anesthetic complications: no ? ? ?No notable events documented. ? ? ?Last Vitals:  ?Vitals:  ? 01/16/22 1715 01/16/22 1729  ?BP: (!) 143/77 (!) 160/63  ?Pulse: 67 67  ?Resp: 14 16  ?Temp: (!) 36.2 ?C (!) 36.1 ?C  ?SpO2: 97% 97%  ?  ?Last Pain:  ?Vitals:  ? 01/16/22 1729  ?TempSrc: Temporal  ?PainSc: 2   ? ? ?  ?  ?  ?  ?  ?  ? ?Arita Miss ? ? ? ? ?

## 2022-01-16 NOTE — Discharge Instructions (Signed)

## 2022-01-16 NOTE — Interval H&P Note (Signed)
History and Physical Interval Note: ? ?01/16/2022 ?2:03 PM ? ?Thomas Allen  has presented today for surgery, with the diagnosis of right inguinal hernia.  The various methods of treatment have been discussed with the patient and family. After consideration of risks, benefits and other options for treatment, the patient has consented to  Procedure(s): ?[place] (Right) as a surgical intervention.  The patient's history has been reviewed, patient examined, no change in status, stable for surgery.  I have reviewed the patient's chart and labs.  Questions were answered to the patient's satisfaction.   ?The right side is marked.  ? ?[name] ? ? ?

## 2022-01-16 NOTE — Transfer of Care (Addendum)
Immediate Anesthesia Transfer of Care Note ? ?Patient: Thomas Allen ? ?Procedure(s) Performed: XI ROBOTIC ASSISTED INGUINAL HERNIA (Right) ? ?Patient Location: PACU ? ?Anesthesia Type:MAC and General ? ?Level of Consciousness: drowsy ? ?Airway & Oxygen Therapy: Patient Spontanous Breathing ? ?Post-op Assessment: Report given to RN ? ?Post vital signs: stable ? ?Last Vitals:  ?Vitals Value Taken Time  ?BP    ?Temp    ?Pulse    ?Resp    ?SpO2    ? ? ?Last Pain:  ?Vitals:  ? 01/16/22 1234  ?TempSrc: Temporal  ?PainSc: 1   ?   ? ?  ? ?Complications: No notable events documented. ?

## 2022-01-17 ENCOUNTER — Encounter: Payer: Self-pay | Admitting: Surgery

## 2022-01-18 ENCOUNTER — Encounter: Payer: Self-pay | Admitting: Surgery

## 2022-01-26 DIAGNOSIS — Z79899 Other long term (current) drug therapy: Secondary | ICD-10-CM | POA: Diagnosis not present

## 2022-01-26 DIAGNOSIS — C772 Secondary and unspecified malignant neoplasm of intra-abdominal lymph nodes: Secondary | ICD-10-CM | POA: Diagnosis not present

## 2022-01-26 DIAGNOSIS — C61 Malignant neoplasm of prostate: Secondary | ICD-10-CM | POA: Diagnosis not present

## 2022-01-31 ENCOUNTER — Encounter: Payer: Medicare Other | Admitting: Physician Assistant

## 2022-02-02 ENCOUNTER — Ambulatory Visit (INDEPENDENT_AMBULATORY_CARE_PROVIDER_SITE_OTHER): Payer: Medicare Other | Admitting: Surgery

## 2022-02-02 ENCOUNTER — Other Ambulatory Visit: Payer: Self-pay

## 2022-02-02 ENCOUNTER — Encounter: Payer: Self-pay | Admitting: Physician Assistant

## 2022-02-02 VITALS — BP 109/68 | HR 88 | Temp 97.6°F | Ht 68.0 in | Wt 167.8 lb

## 2022-02-02 DIAGNOSIS — K4090 Unilateral inguinal hernia, without obstruction or gangrene, not specified as recurrent: Secondary | ICD-10-CM

## 2022-02-02 DIAGNOSIS — Z8719 Personal history of other diseases of the digestive system: Secondary | ICD-10-CM | POA: Insufficient documentation

## 2022-02-02 DIAGNOSIS — Z09 Encounter for follow-up examination after completed treatment for conditions other than malignant neoplasm: Secondary | ICD-10-CM

## 2022-02-02 NOTE — Progress Notes (Signed)
[facility] POST-OP OFFICE VISIT  02/02/2022  HPI: Thomas Allen is a 81 y.o. male 17 days s/p robotic right inguinal hernia repair.  He notes still a slight degree of tenderness in the groin area, otherwise no complaints. We did discuss his weight loss secondary to taking Jardiance.  He reports it was a progressive 2 to 3 pounds a month kind of weight loss over some time.  He is no longer taking Jardiance and continues to maintain his weight.  His blood sugars and diabetes have been well controlled.  Vital signs: BP 109/68   Pulse 88   Temp 97.6 F (36.4 C) (Oral)   Ht '5\' 8"'$  (1.727 m)   Wt 167 lb 12.8 oz (76.1 kg)   SpO2 99%   BMI 25.51 kg/m    Physical Exam: Constitutional: He appears well at his baseline. Abdomen: Soft nontender. Skin: Incisions clean, dry and intact.  The right groin is free of any suspicion for pseudohernia.  No hematoma no seroma present.  Mildly tender on Valsalva without evidence of hernia.  Assessment/Plan: This is a 81 y.o. male 17 days s/p robotic right inguinal hernia repair.  Patient Active Problem List   Diagnosis Date Noted   Right inguinal hernia 01/10/2022   Moderate protein-calorie malnutrition ([place]) 10/12/2021   Low back strain, initial encounter 01/28/2021   Hand pain, right 08/23/2017   Erectile dysfunction following radiation therapy 06/26/2017   Urinary frequency 06/18/2017   Hyperlipidemia associated with type 2 diabetes mellitus ([place]) 03/12/2017   Asymptomatic PVCs 02/05/2017   Gastroesophageal reflux disease 02/05/2017   Alcohol use disorder 01/19/2016   Type II diabetes mellitus with complication ([place]) 62/69/4854   Carpal tunnel syndrome 03/30/2015   Elevated blood pressure, situational 03/30/2015   Abnormal LFTs 03/30/2015   H/O adenomatous polyp of colon 03/30/2015   Tobacco use disorder, moderate, in sustained remission 03/30/2015   Prostate cancer ([place]) 09/02/2013   Posterior calcaneal exostosis 02/26/2013    Calcific Achilles tendinitis 02/26/2013    -Advised to continue his precautions over the next couple weeks as he wishes.  Expect he may gradually ease into more progressive activities as he is feels comfortable and confident.  We will be glad to see this gentleman back again as needed.   [name] M.D., FACS 02/02/2022, 1:41 PM

## 2022-02-02 NOTE — Patient Instructions (Signed)

## 2022-04-07 DIAGNOSIS — Z79899 Other long term (current) drug therapy: Secondary | ICD-10-CM | POA: Diagnosis not present

## 2022-04-07 DIAGNOSIS — C772 Secondary and unspecified malignant neoplasm of intra-abdominal lymph nodes: Secondary | ICD-10-CM | POA: Diagnosis not present

## 2022-04-07 DIAGNOSIS — C61 Malignant neoplasm of prostate: Secondary | ICD-10-CM | POA: Diagnosis not present

## 2022-04-07 DIAGNOSIS — Z79818 Long term (current) use of other agents affecting estrogen receptors and estrogen levels: Secondary | ICD-10-CM | POA: Diagnosis not present

## 2022-04-13 ENCOUNTER — Other Ambulatory Visit: Payer: Self-pay | Admitting: Internal Medicine

## 2022-04-13 DIAGNOSIS — E119 Type 2 diabetes mellitus without complications: Secondary | ICD-10-CM

## 2022-04-13 NOTE — Telephone Encounter (Signed)
Requested medication (s) are due for refill today:   Yes  Requested medication (s) are on the active medication list:   Yes  Future visit scheduled:   Yes 04/18/2022   Last ordered: 01/13/2022 #180, 0 refills  Returned because A1c due.      Requested Prescriptions  Pending Prescriptions Disp Refills   metFORMIN (GLUCOPHAGE) 1000 MG tablet [Pharmacy Med Name: METFORMIN HCL 1,000 MG TABLET] 180 tablet 0    Sig: TAKE 1 TABLET BY MOUTH TWICE A DAY     Endocrinology:  Diabetes - Biguanides Failed - 04/13/2022  2:14 AM      Failed - HBA1C is between 0 and 7.9 and within 180 days    Hemoglobin A1C  Date Value Ref Range Status  10/12/2021 5.6 4.0 - 5.6 % Corrected  01/19/2021 5.9  Final         Failed - B12 Level in normal range and within 720 days    Vitamin B-12  Date Value Ref Range Status  01/09/2022 >2000 (H) 232 - 1245 pg/mL Final         Passed - Cr in normal range and within 360 days    Creatinine  Date Value Ref Range Status  08/24/2013 0.76 0.60 - 1.30 mg/dL Final   Creatinine, Ser  Date Value Ref Range Status  10/12/2021 0.92 0.76 - 1.27 mg/dL Final         Passed - eGFR in normal range and within 360 days    EGFR (African American)  Date Value Ref Range Status  08/24/2013 >60  Final   GFR calc Af Amer  Date Value Ref Range Status  08/16/2020 84 >59 mL/min/1.73 Final    Comment:    **In accordance with recommendations from the NKF-ASN Task force,**   [facility] is in the process of updating its eGFR calculation to the   2021 CKD-EPI creatinine equation that estimates kidney function   without a race variable.    EGFR (Non-African Amer.)  Date Value Ref Range Status  08/24/2013 >60  Final    Comment:    eGFR values <56m/min/1.73 m2 may be an indication of chronic kidney disease (CKD). Calculated eGFR is useful in patients with stable renal function. The eGFR calculation will not be reliable in acutely ill patients when serum creatinine is changing rapidly.  It is not useful in  patients on dialysis. The eGFR calculation may not be applicable to patients at the low and high extremes of body sizes, pregnant women, and vegetarians. POTASSIUM - Slight hemolysis, interpret results with  - caution.    GFR, Estimated  Date Value Ref Range Status  01/05/2021 58 (L) >60 mL/min Final    Comment:    (NOTE) Calculated using the CKD-EPI Creatinine Equation (2021)    eGFR  Date Value Ref Range Status  10/12/2021 85 >59 mL/min/1.73 Final         Passed - Valid encounter within last 6 months    Recent Outpatient Visits           3 months ago Type II diabetes mellitus with complication [facility]   6 months ago Essential hypertension   [facility]   9 months [facility], [facility]   1 year [facility]   1 year ago Frequent urination   [facility] Juline Patch, [facility]       Future Appointments             In 5 days Glean Hess, MD [facility]            Passed - CBC within normal limits and completed in the last 12 months    WBC  Date Value Ref Range Status  01/09/2022 7.0 3.4 - 10.8 x10E3/uL Final  01/05/2021 8.1 4.0 - 10.5 K/uL Final   RBC  Date Value Ref Range Status  01/09/2022 3.75 (L) 4.14 - 5.80 x10E6/uL Final  01/05/2021 3.99 (L) 4.22 - 5.81 MIL/uL Final   Hemoglobin  Date Value Ref Range Status  01/09/2022 12.5 (L) 13.0 - 17.7 g/dL Final   Hematocrit  Date Value Ref Range Status  01/09/2022 37.1 (L) 37.5 - 51.0 % Final   MCHC  Date Value Ref Range Status  01/09/2022 33.7 31.5 - 35.7 g/dL Final  01/05/2021 33.6 30.0 - 36.0 g/dL Final   [facility]  Date Value Ref Range Status  01/09/2022 33.3 (H) 26.6 - 33.0 pg Final  01/05/2021 33.1 26.0 - 34.0 pg Final   MCV  Date Value Ref Range Status  01/09/2022  99 (H) 79 - 97 fL Final  08/24/2013 98 80 - 100 fL Final   No results found for: "PLTCOUNTKUC", "LABPLAT", "POCPLA" RDW  Date Value Ref Range Status  01/09/2022 12.1 11.6 - 15.4 % Final  08/24/2013 12.9 11.5 - 14.5 % Final

## 2022-04-18 ENCOUNTER — Ambulatory Visit: Payer: Medicare Other | Admitting: Internal Medicine

## 2022-05-03 ENCOUNTER — Other Ambulatory Visit: Payer: Self-pay | Admitting: Internal Medicine

## 2022-05-03 DIAGNOSIS — K219 Gastro-esophageal reflux disease without esophagitis: Secondary | ICD-10-CM

## 2022-05-03 NOTE — Telephone Encounter (Signed)
Requested Prescriptions  Pending Prescriptions Disp Refills  . omeprazole (PRILOSEC) 20 MG capsule [Pharmacy Med Name: OMEPRAZOLE DR 20 MG CAPSULE] 180 capsule 0    Sig: TAKE 1 CAPSULE (20 MG TOTAL) BY MOUTH 2 (TWO) TIMES DAILY BEFORE A MEAL.     Gastroenterology: Proton Pump Inhibitors Passed - 05/03/2022 11:31 AM      Passed - Valid encounter within last 12 months    Recent Outpatient Visits          3 months ago Type II diabetes mellitus with complication [facility])   [place] [facility] at [facility], Jesse Sans, MD   6 months ago Essential hypertension   [place] [facility] at [facility], Jesse Sans, MD   10 months ago Diarrhea, unspecified type   [facility] at [facility], Jesse Sans, MD   1 year ago Urinary frequency   [place] [facility] at [facility], Jesse Sans, MD   1 year ago Frequent urination   [place] [facility] at [place], [place], MD      Future Appointments            In 2 weeks Army Melia Jesse Sans, MD [place] and Sports Medicine at [facility], [facility]

## 2022-05-18 ENCOUNTER — Encounter: Payer: Self-pay | Admitting: Internal Medicine

## 2022-05-18 ENCOUNTER — Ambulatory Visit (INDEPENDENT_AMBULATORY_CARE_PROVIDER_SITE_OTHER): Payer: Medicare Other | Admitting: Internal Medicine

## 2022-05-18 VITALS — BP 122/76 | HR 1 | Ht 68.0 in | Wt 186.2 lb

## 2022-05-18 DIAGNOSIS — E118 Type 2 diabetes mellitus with unspecified complications: Secondary | ICD-10-CM | POA: Diagnosis not present

## 2022-05-18 DIAGNOSIS — E785 Hyperlipidemia, unspecified: Secondary | ICD-10-CM | POA: Diagnosis not present

## 2022-05-18 DIAGNOSIS — Z79818 Long term (current) use of other agents affecting estrogen receptors and estrogen levels: Secondary | ICD-10-CM | POA: Diagnosis not present

## 2022-05-18 DIAGNOSIS — E1169 Type 2 diabetes mellitus with other specified complication: Secondary | ICD-10-CM

## 2022-05-18 DIAGNOSIS — R030 Elevated blood-pressure reading, without diagnosis of hypertension: Secondary | ICD-10-CM | POA: Diagnosis not present

## 2022-05-18 LAB — POCT GLYCOSYLATED HEMOGLOBIN (HGB A1C): Hemoglobin A1C: 5.6 % (ref 4.0–5.6)

## 2022-05-18 MED ORDER — BLOOD GLUCOSE MONITOR KIT: PACK | 0 refills | Status: DC

## 2022-05-18 NOTE — Progress Notes (Signed)
Date:  05/18/2022   Name:  Thomas Allen   DOB:  1941-01-09   MRN:  [id number]   Chief Complaint: Diabetes (POC4) and Hypertension  Hypertension This is a chronic problem. The current episode started more than 1 year ago. The problem has been waxing and waning since onset. The problem is controlled. Pertinent negatives include no chest pain, headaches, palpitations or shortness of breath.  Diabetes He presents for his follow-up diabetic visit. He has type 2 diabetes mellitus. Pertinent negatives for hypoglycemia include no headaches, nervousness/anxiousness or tremors. Pertinent negatives for diabetes include no chest pain, no fatigue, no polydipsia and no polyuria.  Hyperlipidemia This is a chronic problem. The problem is uncontrolled. Pertinent negatives include no chest pain or shortness of breath. He is currently on no antihyperlipidemic treatment.  Prostate cancer - on androgen deprivation therapy.  Concerned about bone loss and wants DEXA.  He is already on calcium and Oncology plans to put him on Prolia this fall.  Lab Results  Component Value Date   NA 131 (L) 10/12/2021   K 5.0 10/12/2021   CO2 22 10/12/2021   GLUCOSE 107 (H) 10/12/2021   BUN 13 10/12/2021   CREATININE 0.92 10/12/2021   CALCIUM 9.6 10/12/2021   EGFR 85 10/12/2021   GFRNONAA 58 (L) 01/05/2021   Lab Results  Component Value Date   CHOL 166 10/12/2021   HDL 45 10/12/2021   LDLCALC 91 10/12/2021   TRIG 175 (H) 10/12/2021   CHOLHDL 3.7 10/12/2021   Lab Results  Component Value Date   TSH 2.040 02/05/2017   Lab Results  Component Value Date   HGBA1C 5.6 05/18/2022   Lab Results  Component Value Date   WBC 7.0 01/09/2022   HGB 12.5 (L) 01/09/2022   HCT 37.1 (L) 01/09/2022   MCV 99 (H) 01/09/2022   PLT 233 01/09/2022   Lab Results  Component Value Date   ALT 7 10/12/2021   AST 6 10/12/2021   ALKPHOS 58 10/12/2021   BILITOT 0.3 10/12/2021   No results found for: "25OHVITD2", "25OHVITD3",  "VD25OH"   Review of Systems  Constitutional:  Positive for unexpected weight change. Negative for appetite change and fatigue.  Eyes:  Negative for visual disturbance.  Respiratory:  Negative for cough, shortness of breath and wheezing.   Cardiovascular:  Negative for chest pain, palpitations and leg swelling.       No claudication or rest pain   Gastrointestinal:  Positive for diarrhea (intermittent). Negative for abdominal pain and blood in stool.  Endocrine: Negative for polydipsia and polyuria.  Genitourinary:  Positive for frequency. Negative for dysuria and hematuria.  Skin:  Negative for color change and rash.  Neurological:  Negative for tremors, numbness and headaches.  Psychiatric/Behavioral:  Negative for dysphoric mood and sleep disturbance. The patient is not nervous/anxious.     Patient Active Problem List   Diagnosis Date Noted   Status post laparoscopic hernia repair 02/02/2022   Moderate protein-calorie malnutrition ([place]) 10/12/2021   Low back strain, initial encounter 01/28/2021   Hand pain, right 08/23/2017   Erectile dysfunction following radiation therapy 06/26/2017   Urinary frequency 06/18/2017   Hyperlipidemia associated with type 2 diabetes mellitus ([place]) 03/12/2017   Asymptomatic PVCs 02/05/2017   Gastroesophageal reflux disease 02/05/2017   Alcohol use disorder 01/19/2016   Type II diabetes mellitus with complication ([place]) 15/61/5379   Carpal tunnel syndrome 03/30/2015   Elevated blood pressure, situational 03/30/2015   Abnormal LFTs 03/30/2015  H/O adenomatous polyp of colon 03/30/2015   Tobacco use disorder, moderate, in sustained remission 03/30/2015   Prostate cancer ([place]) 09/02/2013   Posterior calcaneal exostosis 02/26/2013   Calcific Achilles tendinitis 02/26/2013    Allergies  Allergen Reactions   Ace Inhibitors Cough    Past Surgical History:  Procedure Laterality Date   APPENDECTOMY     COLONOSCOPY  2008   benign polyps    COLONOSCOPY WITH PROPOFOL N/A 08/07/2017   Procedure: COLONOSCOPY WITH PROPOFOL;  Surgeon: Lollie Sails, MD;  Location[facility] ENDOSCOPY;  Service: Endoscopy;  Laterality: N/A;   COLONOSCOPY WITH PROPOFOL N/A 12/03/2020   Procedure: COLONOSCOPY WITH PROPOFOL;  Surgeon: Lesly Rubenstein, MD;  Location: [facility] ENDOSCOPY;  Service: Endoscopy;  Laterality: N/A;   ESOPHAGOGASTRODUODENOSCOPY (EGD) WITH PROPOFOL N/A 08/07/2017   Procedure: ESOPHAGOGASTRODUODENOSCOPY (EGD) WITH PROPOFOL;  Surgeon: Lollie Sails, MD;  Location: [facility] ENDOSCOPY;  Service: Endoscopy;  Laterality: N/A;   INSERTION OF MESH Right 01/16/2022   Procedure: INSERTION OF MESH;  Surgeon: [name], MD;  Location: [facility] ORS;  Service: General;  Laterality: Right;   lithopexy     PROSTATE BIOPSY  2012   malignant   REPLACEMENT TOTAL KNEE Bilateral    2009,2015    Social History   Tobacco Use   Smoking status: Former    Types: Cigarettes    Quit date: 1976    Years since quitting: 47.7   Smokeless tobacco: Never  Vaping Use   Vaping Use: Never used  Substance Use Topics   Alcohol use: Yes    Alcohol/week: 14.0 standard drinks of alcohol    Types: 14 Shots of liquor per week    Comment: 2 scotch drinks per night   Drug use: No     Medication list has been reviewed and updated.  Current Meds  Medication Sig   aspirin 325 MG tablet Take 650 mg by mouth at bedtime as needed for moderate pain. Pt take this med for "wrist pain" from fall.   blood glucose meter kit and supplies KIT Dispense based on patient and insurance preference. Use up to four times daily as directed.   calcium carbonate (OS-CAL - DOSED IN MG OF ELEMENTAL CALCIUM) 1250 (500 Ca) MG tablet Take 1 tablet by mouth.   enzalutamide (XTANDI) 40 MG tablet Take 40 mg by mouth in the morning.   ibuprofen (ADVIL) 800 MG tablet Take 1 tablet (800 mg total) by mouth every 8 (eight) hours as needed.   metFORMIN (GLUCOPHAGE) 1000 MG tablet TAKE 1  TABLET BY MOUTH TWICE A DAY (Patient taking differently: Take 1,000 mg by mouth daily with breakfast.)   omeprazole (PRILOSEC) 20 MG capsule TAKE 1 CAPSULE (20 MG TOTAL) BY MOUTH 2 (TWO) TIMES DAILY BEFORE A MEAL. (Patient taking differently: Take 20 mg by mouth daily.)   vitamin B-12 (CYANOCOBALAMIN) 1000 MCG tablet Take 1,000 mcg by mouth every Monday, Wednesday, and Friday.       05/18/2022   10:56 AM 01/09/2022    9:49 AM 10/12/2021    9:34 AM 01/28/2021    1:33 PM  GAD 7 : Generalized Anxiety Score  Nervous, Anxious, on Edge 0 0 0 0  Control/stop worrying 0 0 0 0  Worry too much - different things 0 0 0 0  Trouble relaxing 0 0 0 0  Restless 0 0 0 0  Easily annoyed or irritable 0 0 0 0  Afraid - awful might happen 0 0 0 0  Total GAD  7 Score 0 0 0 0  Anxiety Difficulty Not difficult at all Not difficult at all  Not difficult at all       05/18/2022   10:56 AM 01/09/2022    9:49 AM 12/21/2021    1:39 PM  Depression screen PHQ 2/9  Decreased Interest 0 0 0  Down, Depressed, Hopeless 0 0 0  PHQ - 2 Score 0 0 0  Altered sleeping 1 2   Tired, decreased energy 0 0   Change in appetite 0 0   Feeling bad or failure about yourself  0 0   Trouble concentrating 0 0   Moving slowly or fidgety/restless 0 0   Suicidal thoughts 0 0   PHQ-9 Score 1 2   Difficult doing work/chores Not difficult at all Not difficult at all     BP Readings from Last 3 Encounters:  05/18/22 122/76  02/02/22 109/68  01/16/22 (!) 160/63    Physical Exam Vitals and nursing note reviewed.  Constitutional:      General: He is not in acute distress.    Appearance: Normal appearance. He is well-developed.  HENT:     Head: Normocephalic and atraumatic.  Neck:     Vascular: No carotid bruit.  Cardiovascular:     Rate and Rhythm: Normal rate and regular rhythm.     Pulses: Normal pulses.     Heart sounds: No murmur heard. Pulmonary:     Effort: Pulmonary effort is normal. No respiratory distress.      Breath sounds: No wheezing or rhonchi.  Abdominal:     General: Abdomen is flat.     Palpations: Abdomen is soft.     Tenderness: There is no abdominal tenderness.  Musculoskeletal:     Cervical back: Normal range of motion.     Right lower leg: No edema.     Left lower leg: No edema.  Lymphadenopathy:     Cervical: No cervical adenopathy.  Skin:    General: Skin is warm and dry.     Capillary Refill: Capillary refill takes less than 2 seconds.     Findings: No rash.  Neurological:     Mental Status: He is alert and oriented to person, place, and time.  Psychiatric:        Mood and Affect: Mood normal.        Behavior: Behavior normal.    Diabetic Foot Exam - Simple   Simple Foot Form Diabetic Foot exam was performed with the following findings: Yes 05/18/2022 11:11 AM  Visual Inspection No deformities, no ulcerations, no other skin breakdown bilaterally: Yes Sensation Testing Intact to touch and monofilament testing bilaterally: Yes Pulse Check Posterior Tibialis and Dorsalis pulse intact bilaterally: Yes Comments      Wt Readings from Last 3 Encounters:  05/18/22 186 lb 3.2 oz (84.5 kg)  02/02/22 167 lb 12.8 oz (76.1 kg)  01/12/22 172 lb (78 kg)    BP 122/76   Pulse (!) 1   Ht $R'5\' 8"'lP$  (1.727 m)   Wt 186 lb 3.2 oz (84.5 kg)   SpO2 99%   BMI 28.31 kg/m   Assessment and Plan: 1. Type II diabetes mellitus with complication (HCC) Clinically stable by exam and report without s/s of hypoglycemia. DM complicated by hypertension and dyslipidemia. Tolerating medications well without side effects or other concerns. Continue metformin once a day - POCT glycosylated hemoglobin (Hb A1C)= 5.6 - US Carotid Duplex Bilateral - blood glucose meter kit and supplies KIT; Dispense  based on patient and insurance preference. Use up to four times daily as directed.  Dispense: 1 each; Refill: 0  2. Hyperlipidemia associated with type 2 diabetes mellitus ([place]) Will check [place]  He  should be on statin but has declined in the past based on relatively normal lipids. - US Carotid Duplex Bilateral  3. Encounter for monitoring androgen deprivation therapy - DG Bone Density  4. Elevated blood pressure, situational Blood pressure controlled currently.   Partially dictated using Editor, commissioning. Any errors are unintentional.  Halina Maidens, MD [facility]  05/18/2022

## 2022-05-24 NOTE — Addendum Note (Signed)
Addended by: Zada Girt on: 05/24/2022 11:29 AM   Modules accepted: Orders

## 2022-05-26 ENCOUNTER — Other Ambulatory Visit: Payer: Medicare Other

## 2022-05-29 ENCOUNTER — Ambulatory Visit: Admission: RE | Admit: 2022-05-29 | Payer: Medicare Other | Source: Ambulatory Visit

## 2022-06-12 ENCOUNTER — Emergency Department: Payer: Medicare Other

## 2022-06-12 ENCOUNTER — Telehealth: Payer: Self-pay

## 2022-06-12 ENCOUNTER — Encounter: Payer: Self-pay | Admitting: Emergency Medicine

## 2022-06-12 ENCOUNTER — Other Ambulatory Visit: Payer: Self-pay

## 2022-06-12 ENCOUNTER — Emergency Department
Admission: EM | Admit: 2022-06-12 | Discharge: 2022-06-12 | Disposition: A | Discharge status: Home / Self Care | Payer: Medicare Other | Attending: Emergency Medicine | Admitting: Emergency Medicine

## 2022-06-12 DIAGNOSIS — Y92002 Bathroom of unspecified non-institutional (private) residence single-family (private) house as the place of occurrence of the external cause: Secondary | ICD-10-CM | POA: Insufficient documentation

## 2022-06-12 DIAGNOSIS — E119 Type 2 diabetes mellitus without complications: Secondary | ICD-10-CM | POA: Insufficient documentation

## 2022-06-12 DIAGNOSIS — R059 Cough, unspecified: Secondary | ICD-10-CM | POA: Diagnosis not present

## 2022-06-12 DIAGNOSIS — S12491A Other nondisplaced fracture of fifth cervical vertebra, initial encounter for closed fracture: Secondary | ICD-10-CM | POA: Diagnosis not present

## 2022-06-12 DIAGNOSIS — R519 Headache, unspecified: Secondary | ICD-10-CM | POA: Insufficient documentation

## 2022-06-12 DIAGNOSIS — R000 Tachycardia, unspecified: Secondary | ICD-10-CM | POA: Diagnosis not present

## 2022-06-12 DIAGNOSIS — W19XXXA Unspecified fall, initial encounter: Secondary | ICD-10-CM | POA: Insufficient documentation

## 2022-06-12 DIAGNOSIS — M25552 Pain in left hip: Secondary | ICD-10-CM | POA: Diagnosis not present

## 2022-06-12 DIAGNOSIS — S3993XA Unspecified injury of pelvis, initial encounter: Secondary | ICD-10-CM | POA: Diagnosis not present

## 2022-06-12 DIAGNOSIS — M542 Cervicalgia: Secondary | ICD-10-CM | POA: Diagnosis not present

## 2022-06-12 DIAGNOSIS — S50812A Abrasion of left forearm, initial encounter: Secondary | ICD-10-CM | POA: Diagnosis not present

## 2022-06-12 DIAGNOSIS — S12401A Unspecified nondisplaced fracture of fifth cervical vertebra, initial encounter for closed fracture: Secondary | ICD-10-CM | POA: Diagnosis not present

## 2022-06-12 DIAGNOSIS — M40202 Unspecified kyphosis, cervical region: Secondary | ICD-10-CM | POA: Diagnosis not present

## 2022-06-12 DIAGNOSIS — D72829 Elevated white blood cell count, unspecified: Secondary | ICD-10-CM | POA: Insufficient documentation

## 2022-06-12 DIAGNOSIS — I491 Atrial premature depolarization: Secondary | ICD-10-CM | POA: Diagnosis not present

## 2022-06-12 DIAGNOSIS — I10 Essential (primary) hypertension: Secondary | ICD-10-CM | POA: Insufficient documentation

## 2022-06-12 DIAGNOSIS — I6381 Other cerebral infarction due to occlusion or stenosis of small artery: Secondary | ICD-10-CM | POA: Diagnosis not present

## 2022-06-12 LAB — COMPREHENSIVE METABOLIC PANEL
ALT: 23 U/L (ref 0–44)
AST: 24 U/L (ref 15–41)
Albumin: 4 g/dL (ref 3.5–5.0)
Alkaline Phosphatase: 80 U/L (ref 38–126)
Anion gap: 10 (ref 5–15)
BUN: 24 mg/dL — ABNORMAL HIGH (ref 8–23)
CO2: 24 mmol/L (ref 22–32)
Calcium: 9.5 mg/dL (ref 8.9–10.3)
Chloride: 104 mmol/L (ref 98–111)
Creatinine, Ser: 0.93 mg/dL (ref 0.61–1.24)
GFR, Estimated: >60 mL/min (ref >60)
Glucose, Bld: 198 mg/dL — ABNORMAL HIGH (ref 70–99)
Potassium: 3.8 mmol/L (ref 3.5–5.1)
Sodium: 138 mmol/L (ref 135–145)
Total Bilirubin: 0.7 mg/dL (ref 0.3–1.2)
Total Protein: 7.8 g/dL (ref 6.5–8.1)

## 2022-06-12 LAB — CK: Total CK: 377 U/L (ref 49–397)

## 2022-06-12 LAB — CBC WITH DIFFERENTIAL/PLATELET
Abs Immature Granulocytes: 0.12 10*3/uL — ABNORMAL HIGH (ref 0.00–0.07)
Basophils Absolute: 0 10*3/uL (ref 0.0–0.1)
Basophils Relative: 0 %
Eosinophils Absolute: 0 10*3/uL (ref 0.0–0.5)
Eosinophils Relative: 0 %
HCT: 40.1 % (ref 39.0–52.0)
Hemoglobin: 13.6 g/dL (ref 13.0–17.0)
Immature Granulocytes: 1 %
Lymphocytes Relative: 9 %
Lymphs Abs: 1.1 10*3/uL (ref 0.7–4.0)
MCH: 33.3 pg (ref 26.0–34.0)
MCHC: 33.9 g/dL (ref 30.0–36.0)
MCV: 98.3 fL (ref 80.0–100.0)
Monocytes Absolute: 0.8 10*3/uL (ref 0.1–1.0)
Monocytes Relative: 6 %
Neutro Abs: 10.6 10*3/uL — ABNORMAL HIGH (ref 1.7–7.7)
Neutrophils Relative %: 84 %
Platelets: 266 10*3/uL (ref 150–400)
RBC: 4.08 MIL/uL — ABNORMAL LOW (ref 4.22–5.81)
RDW: 12.2 % (ref 11.5–15.5)
WBC: 12.6 10*3/uL — ABNORMAL HIGH (ref 4.0–10.5)
nRBC: 0 % (ref 0.0–0.2)

## 2022-06-12 MED ORDER — MORPHINE SULFATE (PF) 2 MG/ML IV SOLN
2.0000 mg | Freq: Once | INTRAVENOUS | Status: AC
  Administered 2022-06-12: 2 mg via INTRAVENOUS
  Filled 2022-06-12: qty 1

## 2022-06-12 MED ORDER — LIDOCAINE 5 % EX PTCH: 1.0000 | MEDICATED_PATCH | Freq: Two times a day (BID) | CUTANEOUS | 0 refills | Status: DC

## 2022-06-12 MED ORDER — LACTATED RINGERS IV BOLUS
1000.0000 mL | Freq: Once | INTRAVENOUS | Status: AC
  Administered 2022-06-12: 1000 mL via INTRAVENOUS

## 2022-06-12 MED ORDER — OXYCODONE HCL 5 MG PO TABS: 5.0000 mg | ORAL_TABLET | Freq: Three times a day (TID) | ORAL | 0 refills | Status: DC | PRN

## 2022-06-12 MED ORDER — OXYCODONE HCL 5 MG PO TABS
5.0000 mg | ORAL_TABLET | Freq: Once | ORAL | Status: AC
  Administered 2022-06-12: 5 mg via ORAL
  Filled 2022-06-12: qty 1

## 2022-06-12 MED ORDER — ACETAMINOPHEN 500 MG PO TABS
1000.0000 mg | ORAL_TABLET | Freq: Once | ORAL | Status: AC
  Administered 2022-06-12: 1000 mg via ORAL
  Filled 2022-06-12: qty 2

## 2022-06-12 NOTE — ED Provider Notes (Addendum)
[facility] Provider Note    Event Date/Time   First MD Initiated Contact with Patient 06/12/22 [phone]     (approximate)   History   Fall   HPI  Thomas Allen is a 81 y.o. male who presents to the ED for evaluation of Fall   I review clinic visit from 9/14. Hx DM, HLD, GERD, HTN, prostate CA on androgen deprivation therapy.   Patient presents to the ED from home by EMS for the patient a fall and neck pain.  Reports he accidentally fell while turning after going to the bathroom tonight.  He was unable to get up out of the floor, laid there for about 4 to 5 hours before his wife finally called 911 because they could not get him up.  Reports he had some left hip pain initially because he was laying on his left hip on the hard tile floor, reports this has since resolved since going to the EMS stretcher and our stretcher here in the ED.  Reporting left neck pain only.  Physical Exam   Triage Vital Signs: ED Triage Vitals [06/12/22 0502]  Enc Vitals Group     BP      Pulse      Resp      Temp      Temp src      SpO2      Weight 180 lb (81.6 kg)     Height '5\' 8"'$  (1.727 m)     Head Circumference      Peak Flow      Pain Score 9     Pain Loc      Pain Edu?      Excl. in [place]?     Most recent vital signs: Vitals:   06/12/22 0512 06/12/22 0600  BP: (!) 166/82 (!) 171/88  Pulse: (!) 103 100  Resp: 16 (!) 23  Temp: 98.1 F (36.7 C)   SpO2: 91% 99%    General: Awake, no distress.  Pleasant and conversational CV:  Good peripheral perfusion.  Resp:  Normal effort.  Abd:  No distention.  Soft and benign MSK:  No deformity noted.  Superficial abrasion to the left ulnar forearm without hematoma, swelling or tenderness to palpation.  Full active and passive range of motion of adjacent joints of the left elbow and wrist. Palpation of all 4 extremities otherwise without evidence of deformity, tenderness or trauma. Neuro:  No focal deficits appreciated.  Cranial nerves II through XII intact 5/5 strength and sensation in all 4 extremities Other:     ED Results / Procedures / Treatments   Labs (all labs ordered are listed, but only abnormal results are displayed) Labs Reviewed  COMPREHENSIVE METABOLIC PANEL - Abnormal; Notable for the following components:      Result Value   Glucose, Bld 198 (*)    BUN 24 (*)    All other components within normal limits  CBC WITH DIFFERENTIAL/PLATELET - Abnormal; Notable for the following components:   WBC 12.6 (*)    RBC 4.08 (*)    Neutro Abs 10.6 (*)    Abs Immature Granulocytes 0.12 (*)    All other components within normal limits  CK  URINALYSIS, ROUTINE W REFLEX MICROSCOPIC    EKG Sinus rhythm with a rate of 101 bpm.  Rightward axis and normal intervals.  No acute signs of acute ischemia.  Nonspecific ST changes   RADIOLOGY CT head interpreted by me without evidence of  acute intracranial pathology CT cervical spine interpreted by me with a anterior C5 fracture. CXR interpreted by me without evidence of acute cardiopulmonary pathology. Plain film the pelvis interpreted by me with evidence of fracture dislocation  Official radiology report(s): CT HEAD WO CONTRAST (5MM)  Result Date: 06/12/2022 CLINICAL DATA:  Mechanical fall, head and neck pain. EXAM: CT HEAD WITHOUT CONTRAST CT CERVICAL SPINE WITHOUT CONTRAST TECHNIQUE: Multidetector CT imaging of the head and cervical spine was performed following the standard protocol without intravenous contrast. Multiplanar CT image reconstructions of the cervical spine were also generated. RADIATION DOSE REDUCTION: This exam was performed according to the departmental dose-optimization program which includes automated exposure control, adjustment of the mA and/or kV according to patient size and/or use of iterative reconstruction technique. COMPARISON:  Cervical spine CT and head CT both 02/28/2021. FINDINGS: CT HEAD FINDINGS Brain: There is moderately  advanced cerebral atrophy, small-vessel disease and atrophic ventriculomegaly with mild cerebellar atrophy. There are few tiny chronic bilateral gangliocapsular lacunar infarctions. No asymmetry is seen concerning for acute infarct, hemorrhage or mass. No midline shift. Vascular: There are patchy calcifications in the siphons, scattered calcific plaque in the distal vertebral arteries. No hyperdense central vasculature. Skull: Intact.  No visible scalp hematoma. Sinuses/Orbits: There is increased membrane thickening and low-density fluid in the left maxillary sinus. Increased patchy membrane disease throughout the ethmoids and mild membrane thickening in the right maxillary sinus. The frontal and sphenoid sinus and right mastoid air cells are clear. Left mastoids are clear aside from trace fluid posteriorly. Other: None. CT CERVICAL SPINE FINDINGS Alignment: Mild cervical kyphosis again centered at C4-5. No listhesis is seen. Bone-on-bone anterior atlantodental joint space loss and osteophytes are again shown. Skull base and vertebrae: Osteopenia. 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 There is an acute transverse oblique fracture now seen through the bridging bone and contiguously through the underlying inferior endplate of the C5 vertebral body, but the fracture does not extend into the posterior elements and there are no further fractures. Soft tissues and spinal canal: No prevertebral fluid or swelling. No visible canal hematoma. There are moderate calcifications of the carotid bifurcations. There is no thyroid or laryngeal mass. Disc levels: The cervical discs are degenerated, and as above there is extensive anterior bridging enthesopathy throughout the cervical spine. There are posterior disc osteophyte complexes from C3-4 through C6-7 variably encroaching on the cord surface, with the greatest cord surface encroachment at C5-6 and C6-7. There are mild facet  joint spurring changes but no critical foraminal stenosis. Upper chest: Negative. Other: None. IMPRESSION: 1. No acute intracranial CT findings or depressed skull fractures. 2. Chronic atrophy and small-vessel changes. 3. Membrane thickening and low-density fluid in the left maxillary sinus. Query unrelated acute sinusitis. 4. Acute nondisplaced transverse oblique fracture through anterior bridging enthesopathy at C5 and extended posteriorly through the underlying inferior endplate of C5. No involvement of the pedicles and posterior elements. No spinal canal hematoma was visible. There is no distraction of the facet joints. 5. Extensive anterior bridging enthesopathy of the cervical spine with degenerative changes. 6. Carotid atherosclerosis. 7. Discussed over the phone with Dr. Vladimir Crofts at 5:57 a.m., 06/12/2022 with verbal acknowledgement of key findings. Electronically Signed   By: Telford Nab M.D.   On: 06/12/2022 06:12   CT Cervical Spine Wo Contrast  Result Date: 06/12/2022 CLINICAL DATA:  Mechanical fall, head and neck pain. EXAM: CT HEAD WITHOUT CONTRAST CT CERVICAL SPINE WITHOUT CONTRAST TECHNIQUE:  Multidetector CT imaging of the head and cervical spine was performed following the standard protocol without intravenous contrast. Multiplanar CT image reconstructions of the cervical spine were also generated. RADIATION DOSE REDUCTION: This exam was performed according to the departmental dose-optimization program which includes automated exposure control, adjustment of the mA and/or kV according to patient size and/or use of iterative reconstruction technique. COMPARISON:  Cervical spine CT and head CT both 02/28/2021. FINDINGS: CT HEAD FINDINGS Brain: There is moderately advanced cerebral atrophy, small-vessel disease and atrophic ventriculomegaly with mild cerebellar atrophy. There are few tiny chronic bilateral gangliocapsular lacunar infarctions. No asymmetry is seen concerning for acute infarct,  hemorrhage or mass. No midline shift. Vascular: There are patchy calcifications in the siphons, scattered calcific plaque in the distal vertebral arteries. No hyperdense central vasculature. Skull: Intact.  No visible scalp hematoma. Sinuses/Orbits: There is increased membrane thickening and low-density fluid in the left maxillary sinus. Increased patchy membrane disease throughout the ethmoids and mild membrane thickening in the right maxillary sinus. The frontal and sphenoid sinus and right mastoid air cells are clear. Left mastoids are clear aside from trace fluid posteriorly. Other: None. CT CERVICAL SPINE FINDINGS Alignment: Mild cervical kyphosis again centered at C4-5. No listhesis is seen. Bone-on-bone anterior atlantodental joint space loss and osteophytes are again shown. Skull base and vertebrae: Osteopenia. 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 There is an acute transverse oblique fracture now seen through the bridging bone and contiguously through the underlying inferior endplate of the C5 vertebral body, but the fracture does not extend into the posterior elements and there are no further fractures. Soft tissues and spinal canal: No prevertebral fluid or swelling. No visible canal hematoma. There are moderate calcifications of the carotid bifurcations. There is no thyroid or laryngeal mass. Disc levels: The cervical discs are degenerated, and as above there is extensive anterior bridging enthesopathy throughout the cervical spine. There are posterior disc osteophyte complexes from C3-4 through C6-7 variably encroaching on the cord surface, with the greatest cord surface encroachment at C5-6 and C6-7. There are mild facet joint spurring changes but no critical foraminal stenosis. Upper chest: Negative. Other: None. IMPRESSION: 1. No acute intracranial CT findings or depressed skull fractures. 2. Chronic atrophy and small-vessel changes. 3. Membrane  thickening and low-density fluid in the left maxillary sinus. Query unrelated acute sinusitis. 4. Acute nondisplaced transverse oblique fracture through anterior bridging enthesopathy at C5 and extended posteriorly through the underlying inferior endplate of C5. No involvement of the pedicles and posterior elements. No spinal canal hematoma was visible. There is no distraction of the facet joints. 5. Extensive anterior bridging enthesopathy of the cervical spine with degenerative changes. 6. Carotid atherosclerosis. 7. Discussed over the phone with Dr. Vladimir Crofts at 5:57 a.m., 06/12/2022 with verbal acknowledgement of key findings. Electronically Signed   By: Telford Nab M.D.   On: 06/12/2022 06:12   DG Pelvis Portable  Result Date: 06/12/2022 CLINICAL DATA:  1 week history of cough. EXAM: PORTABLE PELVIS 1-2 VIEWS COMPARISON:  Skip of fall.  Left hip pain. FINDINGS: There is no evidence of pelvic fracture or diastasis. No pelvic bone lesions are seen. IMPRESSION: Negative. Electronically Signed   By: Misty Stanley M.D.   On: 06/12/2022 06:07   DG Chest Portable 1 View  Result Date: 06/12/2022 CLINICAL DATA:  1 week history of cough. EXAM: PORTABLE CHEST 1 VIEW COMPARISON:  08/22/2013 FINDINGS: 0549 hours. Low volume film. The lungs are clear  without focal pneumonia, edema, pneumothorax or pleural effusion. The cardiopericardial silhouette is within normal limits for size. The visualized bony structures of the thorax are unremarkable. IMPRESSION: No active disease. Electronically Signed   By: Misty Stanley M.D.   On: 06/12/2022 06:06    PROCEDURES and INTERVENTIONS:  .1-3 Lead EKG Interpretation  Performed by: Vladimir Crofts, MD Authorized by: Vladimir Crofts, MD     Interpretation: normal     ECG rate:  95   ECG rate assessment: normal     Rhythm: sinus rhythm     Ectopy: none     Conduction: normal   .Critical Care  Performed by: Vladimir Crofts, MD Authorized by: Vladimir Crofts, MD   Critical  care provider statement:    Critical care time (minutes):  30   Critical care was necessary to treat or prevent imminent or life-threatening deterioration of the following conditions:  Trauma   Critical care was time spent personally by me on the following activities:  Development of treatment plan with patient or surrogate, discussions with consultants, evaluation of patient's response to treatment, examination of patient, ordering and review of laboratory studies, ordering and review of radiographic studies, ordering and performing treatments and interventions, pulse oximetry, re-evaluation of patient's condition and review of old charts   Medications  oxyCODONE (Oxy IR/ROXICODONE) immediate release tablet 5 mg (has no administration in time range)  lactated ringers bolus 1,000 mL (0 mLs Intravenous Stopped 06/12/22 0635)  acetaminophen (TYLENOL) tablet 1,000 mg (1,000 mg Oral Given 06/12/22 0557)  morphine (PF) 2 MG/ML injection 2 mg (2 mg Intravenous Given 06/12/22 0557)     IMPRESSION / MDM / [place] / ED COURSE  I reviewed the triage vital signs and the nursing notes.  Differential diagnosis includes, but is not limited to, cervical fracture, cranial hemorrhage, hip fracture, rhabdomyolysis  {Patient presents with symptoms of an acute illness or injury that is potentially life-threatening.  Patient presents with neck pain after a fall with evidence of an anterior C5 fracture that is nondisplaced and suitable for outpatient management with bracing.  He looks systemically well and is GCS 15 with intact neurologic examination.  Small superficial abrasion to the left forearm without clinical signs of other trauma to the extremities or fracture/dislocation around this.  Blood work is reassuring without evidence of rhabdomyolysis.  Metabolic panel is essentially normal and CBC with mild leukocytosis, but I doubt any underlying sepsis or infectious etiology of his symptoms.  Imaging  confirms a C5 fracture.  Consult with neurosurgery who recommends bracing and outpatient management.  We will refer back to neurosurgery and discussed return precautions.  Awaiting ambulatory trial prior to discharge.  Clinical Course as of 06/12/22 0715  Mon Jun 12, 2022  0600 Call from rads regarding CT c spine [DS]  0623 I consult with Dr. Izora Ribas, NSGY on call.  He is currently driving but would look at the images as soon as he is able to see Romilda Garret.  He recommends Aspen collar and outpatient follow-up based off of my description. [DS]  7510 Reassessed and discussed CT results with patient and wife.  [DS]    Clinical Course User Index [DS] Vladimir Crofts, MD     FINAL CLINICAL IMPRESSION(S) / ED DIAGNOSES   Final diagnoses:  Other closed nondisplaced fracture of fifth cervical vertebra, initial encounter [facility])  Fall, initial encounter     Rx / DC Orders   ED Discharge Orders  Ordered    oxyCODONE (ROXICODONE) 5 MG immediate release tablet  Every 8 hours PRN        06/12/22 0710    lidocaine (LIDODERM) 5 %  Every 12 hours        06/12/22 0710             Note:  This document was prepared using Dragon voice recognition software and may include unintentional dictation errors.   Vladimir Crofts, MD 06/12/22 [place], [place], MD 06/12/22 [phone]

## 2022-06-12 NOTE — ED Triage Notes (Signed)
Pt to ED via [facility] from with c/o fall at approx 2230 last night. Per EMS pt c/o L hip pain and neck pain to L lateral area. Pt also presents with skin tear to L elbow. Pt states he does not remember the fall, is unsure if he had LOC with the fall. Pt presents with C-collar in place.

## 2022-06-12 NOTE — Telephone Encounter (Signed)
Transition Care Management Follow-up Telephone Call Date of discharge and from where: 10/09/2023[facility] How have you been since you were released from the hospital?  Any questions or concerns? No  Items Reviewed: Did the pt receive and understand the discharge instructions provided? Yes  Medications obtained and verified? Yes  Other? No  Any new allergies since your discharge? Yes  Dietary orders reviewed? Yes Do you have support at home? Yes   Home Care and Equipment/Supplies: Were home health services ordered? no If so, what is the name of the agency?   Has the agency set up a time to come to the patient's home? not applicable Were you able to get the supplies/equipment? not applicable  Functional Questionnaire: (I = Independent and D = Dependent) ADLs: D  Bathing/Dressing- D  Meal Prep- D  Eating- D  Maintaining continence- I  Transferring/Ambulation- D  Managing Meds- D  Follow up appointments reviewed:  PCP Hospital f/u appt confirmed? No  Per Dr Army Melia - he needs to follow up with Neurosurgery - Dr Nelly Laurence office. Are transportation arrangements needed? No  If their condition worsens, is the pt aware to call PCP or go to the Emergency Dept.? Yes Was the patient provided with contact information for the PCP's office or ED? Yes Was to pt encouraged to call back with questions or concerns? Yes

## 2022-06-12 NOTE — Discharge Instructions (Addendum)
As we discussed, you have a fracture to the anterior (frontal) portion of the fifth cervical vertebrae.  C5.  Please stay in the collar until you follow-up with the neurosurgical clinic and they can clear this from you.   If you develop any strokelike symptoms, weakness to the extremities or uncontrolled pain then please return to the ED.  Use Tylenol for pain and fevers.  Up to 1000 mg per dose, up to 4 times per day.  Do not take more than 4000 mg of Tylenol/acetaminophen within 24 hours..  Please use lidocaine patches at your site of pain.  Apply 1 patch at a time, leave on for 12 hours, then remove for 12 hours.  12 hours on, 12 hours off.  Do not apply more than 1 patch at a time.  Use oxycodone as needed for more severe/breakthrough pain.

## 2022-06-12 NOTE — ED Notes (Signed)
To CT

## 2022-06-12 NOTE — ED Notes (Signed)
Pt walked good around nurses station without any issues.

## 2022-06-13 ENCOUNTER — Telehealth: Payer: Self-pay

## 2022-06-13 NOTE — Telephone Encounter (Signed)
Suggestions? The brace came from the ER, not from [facility]... Can we send an order for an aspen collar to [facility] under you even though we haven't seen him yet?

## 2022-06-13 NOTE — Telephone Encounter (Signed)
-----   Message from Peggyann Shoals sent at 06/13/2022  3:41 PM EDT ----- Regarding: ER--brace question Contact: [phone] Patient was seen in the ER for a compression fracture. Dr.Yarbrough has already sent a message to schedule in 2-3 weeks, appt is scheduled for 07/04/22. Patient complaining of pain and that the brace is not fitting right. They requested to be seen sooner so I placed him on the cancellation list. Regarding the brace can they call [facility]?

## 2022-06-14 NOTE — Telephone Encounter (Signed)
Please notify him that I have faxed an order for a new brace to [facility]. Their office will contact him to fit him with with a new brace.

## 2022-06-14 NOTE — Telephone Encounter (Signed)
I spoke to [name] the patient was also there. They are aware that he will be contacted from the [facility] for a new brace and he has been placed on the cancellation list.

## 2022-06-19 ENCOUNTER — Telehealth: Payer: Self-pay

## 2022-06-19 NOTE — Telephone Encounter (Signed)
     Patient  visit on 10/8  Have you been able to follow up with your primary care physician? yes  The patient was or was not able to obtain any needed medicine or equipment. yes  Are there diet recommendations that you are having difficulty following? Na   Patient expresses understanding of discharge instructions and education provided has no other needs at this time. yes     [place], Care Management  [phone] [address], [place], [place] [address] Phone: [phone] Email: [email or web address]

## 2022-06-24 ENCOUNTER — Other Ambulatory Visit: Payer: Self-pay | Admitting: Internal Medicine

## 2022-06-24 DIAGNOSIS — K219 Gastro-esophageal reflux disease without esophagitis: Secondary | ICD-10-CM

## 2022-06-27 NOTE — Telephone Encounter (Signed)
Requested Prescriptions  Pending Prescriptions Disp Refills  . omeprazole (PRILOSEC) 20 MG capsule [Pharmacy Med Name: OMEPRAZOLE DR 20 MG CAPSULE] 180 capsule 0    Sig: TAKE 1 CAPSULE (20 MG TOTAL) BY MOUTH 2 (TWO) TIMES DAILY BEFORE A MEAL.     Gastroenterology: Proton Pump Inhibitors Passed - 06/24/2022  1:29 PM      Passed - Valid encounter within last 12 months    Recent Outpatient Visits          1 month ago Type II diabetes mellitus with complication [facility])   [place] [facility] at [facility], Jesse Sans, MD   5 months ago Type II diabetes mellitus with complication [facility])   [place] [facility] at [facility], Jesse Sans, MD   8 months ago Essential hypertension   [place] [facility] at [facility], Jesse Sans, MD   1 year ago Diarrhea, unspecified type   [facility] at [facility], Jesse Sans, MD   1 year ago Urinary frequency   [place] [facility] at [facility], Jesse Sans, MD      Future Appointments            In 2 months Army Melia, Jesse Sans, MD [place] and Sports Medicine at [facility], [facility]

## 2022-07-03 ENCOUNTER — Other Ambulatory Visit: Payer: Self-pay

## 2022-07-03 DIAGNOSIS — S12401A Unspecified nondisplaced fracture of fifth cervical vertebra, initial encounter for closed fracture: Secondary | ICD-10-CM

## 2022-07-03 NOTE — Progress Notes (Unsigned)
Referring Physician:  Vladimir Crofts, MD [place],   [id number]  Primary Physician:  Glean Hess, MD  History of Present Illness: 07/03/2022 [name] is here today for ED follow up. He was seen in ED on 06/12/22 with neck pain s/p fall. CT showed C5 fracture. He was placed in aspen collar and is here for follow up.   He is doing well with no current neck pain. He was wearing brace, but stopped wearing it 2 days ago. He does not have brace with him today.   No arm pain. No numbness, tingling, or weakness in his arms.   Given oxycodone from ED.   History of DM, hyperlipidemia, GERD, HTN, and prostate CA.   The symptoms are causing a significant impact on the patient's life.   Review of Systems:  A 10 point review of systems is negative, except for the pertinent positives and negatives detailed in the HPI.  Past Medical History: Past Medical History:  Diagnosis Date   Arthritis    Cancer ([place])    Diabetes mellitus without complication ([place])    GERD (gastroesophageal reflux disease)    Haglund's deformity    History of kidney stones    Hyperlipidemia    Hypertension    Intractable hiccups 02/05/2017   Prostate CA ([place])    Sleep apnea     Past Surgical History: Past Surgical History:  Procedure Laterality Date   APPENDECTOMY     COLONOSCOPY  2008   benign polyps   COLONOSCOPY WITH PROPOFOL N/A 08/07/2017   Procedure: COLONOSCOPY WITH PROPOFOL;  Surgeon: Lollie Sails, MD;  Location: [facility] ENDOSCOPY;  Service: Endoscopy;  Laterality: N/A;   COLONOSCOPY WITH PROPOFOL N/A 12/03/2020   Procedure: COLONOSCOPY WITH PROPOFOL;  Surgeon: Lesly Rubenstein, MD;  Location: [facility] ENDOSCOPY;  Service: Endoscopy;  Laterality: N/A;   ESOPHAGOGASTRODUODENOSCOPY (EGD) WITH PROPOFOL N/A 08/07/2017   Procedure: ESOPHAGOGASTRODUODENOSCOPY (EGD) WITH PROPOFOL;  Surgeon: Lollie Sails, MD;  Location: [facility] ENDOSCOPY;  Service: Endoscopy;  Laterality:  N/A;   INSERTION OF MESH Right 01/16/2022   Procedure: INSERTION OF MESH;  Surgeon: [name], MD;  Location: [facility] ORS;  Service: General;  Laterality: Right;   lithopexy     PROSTATE BIOPSY  2012   malignant   REPLACEMENT TOTAL KNEE Bilateral    [id number]    Allergies: Allergies as of 07/04/2022 - Review Complete 06/12/2022  Allergen Reaction Noted   Ace inhibitors Cough 01/19/2016    Medications: Outpatient Encounter Medications as of 07/04/2022  Medication Sig   aspirin 325 MG tablet Take 650 mg by mouth at bedtime as needed for moderate pain. Pt take this med for "wrist pain" from fall.   blood glucose meter kit and supplies KIT Dispense based on patient and insurance preference. Use up to four times daily as directed.   calcium carbonate (OS-CAL - DOSED IN MG OF ELEMENTAL CALCIUM) 1250 (500 Ca) MG tablet Take 1 tablet by mouth.   enzalutamide (XTANDI) 40 MG tablet Take 40 mg by mouth in the morning.   ibuprofen (ADVIL) 800 MG tablet Take 1 tablet (800 mg total) by mouth every 8 (eight) hours as needed.   lidocaine (LIDODERM) 5 % Place 1 patch onto the skin every 12 (twelve) hours. Remove & Discard patch within 12 hours or as directed by MD   metFORMIN (GLUCOPHAGE) 1000 MG tablet TAKE 1 TABLET BY MOUTH TWICE A DAY (Patient taking differently: Take 1,000 mg by mouth daily  with breakfast.)   omeprazole (PRILOSEC) 20 MG capsule TAKE 1 CAPSULE (20 MG TOTAL) BY MOUTH 2 (TWO) TIMES DAILY BEFORE A MEAL.   oxyCODONE (ROXICODONE) 5 MG immediate release tablet Take 1 tablet (5 mg total) by mouth every 8 (eight) hours as needed for moderate pain, severe pain or breakthrough pain.   vitamin B-12 (CYANOCOBALAMIN) 1000 MCG tablet Take 1,000 mcg by mouth every Monday, Wednesday, and Friday.   No facility-administered encounter medications on file as of 07/04/2022.    Social History: Social History   Tobacco Use   Smoking status: Former    Types: Cigarettes    Quit date: 1976     Years since quitting: 47.8   Smokeless tobacco: Never  Vaping Use   Vaping Use: Never used  Substance Use Topics   Alcohol use: Yes    Alcohol/week: 14.0 standard drinks of alcohol    Types: 14 Shots of liquor per week    Comment: 2 scotch drinks per night   Drug use: No    Family Medical History: Family History  Problem Relation Age of Onset   Diabetes Mother    Colon cancer Mother    Colon cancer Father    Prostate cancer Neg Hx    Bladder Cancer Neg Hx    Kidney cancer Neg Hx     Physical Examination: There were no vitals filed for this visit.  General: Patient is well developed, well nourished, calm, collected, and in no apparent distress. Attention to examination is appropriate.  Respiratory: Patient is breathing without any difficulty.   NEUROLOGICAL:     Awake, alert, oriented to person, place, and time.  Speech is clear and fluent. Fund of knowledge is appropriate.   Cranial Nerves: Pupils equal round and reactive to light.  Facial tone is symmetric.  Facial sensation is symmetric.  ROM of cervical spine not tested.   No posterior cervical tenderness noted.   Strength: Side Biceps Triceps Deltoid Interossei Grip Wrist Ext. Wrist Flex.  R _0 L _1 Side Iliopsoas Quads Hamstring PF DF EHL  R _2 L _3 Reflexes are 1+ and symmetric at the biceps, triceps, brachioradialis, patella and achilles.   Hoffman's is absent.  Clonus is not present.   Bilateral upper and lower extremity sensation is intact to light touch.     Gait is normal.    Medical Decision Making  Imaging:  Cervical xrays dated 07/04/22:  Stable C5 fracture.   Xray report not available for my review. Xrays reviewed with Dr. Izora Ribas.   CT cervical spine dated 06/12/22:  Alignment: Mild cervical kyphosis again centered at C4-5. No listhesis is seen. Bone-on-bone anterior atlantodental joint space loss and osteophytes are again shown.    Skull base and vertebrae: Osteopenia. 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   There is an acute transverse oblique fracture now seen through the bridging bone and contiguously through the underlying inferior endplate of the C5 vertebral body, but the fracture does not extend into the posterior elements and there are no further fractures.   Soft tissues and spinal canal: No prevertebral fluid or swelling. No visible canal hematoma. There are moderate calcifications of the carotid bifurcations. There is no thyroid or laryngeal mass.   Disc levels: The cervical discs are degenerated, and as above there  is extensive anterior bridging enthesopathy throughout the cervical spine.   There are posterior disc osteophyte complexes from C3-4 through C6-7 variably encroaching on the cord surface, with the greatest cord surface encroachment at C5-6 and C6-7. There are mild facet joint spurring changes but no critical foraminal stenosis.   Upper chest: Negative.   Other: None.   IMPRESSION: 1. No acute intracranial CT findings or depressed skull fractures. 2. Chronic atrophy and small-vessel changes. 3. Membrane thickening and low-density fluid in the left maxillary sinus. Query unrelated acute sinusitis. 4. Acute nondisplaced transverse oblique fracture through anterior bridging enthesopathy at C5 and extended posteriorly through the underlying inferior endplate of C5. No involvement of the pedicles and posterior elements. No spinal canal hematoma was visible. There is no distraction of the facet joints. 5. Extensive anterior bridging enthesopathy of the cervical spine with degenerative changes. 6. Carotid atherosclerosis. 7. Discussed over the phone with Dr. Vladimir Crofts at 5:57 a.m., 06/12/2022 with verbal acknowledgement of key findings.     Electronically Signed   By: Thomas Allen M.D.   On: 06/12/2022 06:12  I have  personally reviewed the images and agree with the above interpretation.  Assessment and Plan: Thomas Allen is a pleasant 81 y.o. male with no current neck or arm pain. He has known C5 fracture s/p fall on 06/12/22.   Cervical xrays from today look stable.   Treatment options discussed with patient and following plan made:   - He needs to be back in his aspen cervical collar. We discussed he would likely be in this for 3 months total. He can remove to sleep and eat. He has collar at home.  - No lifting over 5 lbs. No overhead lifting.  - Follow up in 4 weeks with repeat cervical xrays to make sure he does not develop increased kyphosis.   I spent a total of 20 minutes in face-to-face and non-face-to-face activities related to this patient's care toda including review of outside records, review of imaging, review of symptoms, physical exam, discussion of differential diagnosis, discussion of treatment options, and documentation.   Thank you for involving me in the care of this patient.   Thomas Boot PA-C Dept. of Neurosurgery

## 2022-07-04 ENCOUNTER — Ambulatory Visit
Admission: RE | Admit: 2022-07-04 | Discharge: 2022-07-04 | Disposition: A | Discharge status: Home / Self Care | Payer: Medicare Other | Source: Ambulatory Visit | Attending: Orthopedic Surgery | Admitting: Orthopedic Surgery

## 2022-07-04 ENCOUNTER — Encounter: Payer: Self-pay | Admitting: Orthopedic Surgery

## 2022-07-04 ENCOUNTER — Ambulatory Visit
Admission: RE | Admit: 2022-07-04 | Discharge: 2022-07-04 | Disposition: A | Discharge status: Home / Self Care | Payer: Medicare Other | Attending: Orthopedic Surgery | Admitting: Orthopedic Surgery

## 2022-07-04 ENCOUNTER — Ambulatory Visit (INDEPENDENT_AMBULATORY_CARE_PROVIDER_SITE_OTHER): Payer: Medicare Other | Admitting: Orthopedic Surgery

## 2022-07-04 VITALS — BP 120/72 | Ht 68.0 in | Wt 192.0 lb

## 2022-07-04 DIAGNOSIS — S12491A Other nondisplaced fracture of fifth cervical vertebra, initial encounter for closed fracture: Secondary | ICD-10-CM | POA: Diagnosis not present

## 2022-07-04 DIAGNOSIS — S12401A Unspecified nondisplaced fracture of fifth cervical vertebra, initial encounter for closed fracture: Secondary | ICD-10-CM

## 2022-07-04 DIAGNOSIS — W19XXXA Unspecified fall, initial encounter: Secondary | ICD-10-CM

## 2022-07-04 NOTE — Patient Instructions (Signed)
It was so nice to see you today.   You broke a bone in your neck.   YOU NEED TO WEAR THE NECK COLLAR AT ALL TIMES. YOU CAN REMOVE IT TO SLEEP AND CAN REMOVE IT WHEN YOU ARE EATING.   YOU WILL NEED TO BE IN COLLAR LIKELY FOR 3 MONTHS TOTAL TIME.   No lifting over 5 pounds. No lifting or overhead motions.   You have a follow up with me in 4 weeks. You will need to go across the street to get xrays prior to seeing me. Call me if we need to change this appointment.   Please do not hesitate to call if you have any questions or concerns. You can also message me in [place].   Geronimo Boot PA-C [phone]

## 2022-07-10 ENCOUNTER — Ambulatory Visit: Payer: Self-pay | Admitting: *Deleted

## 2022-07-10 NOTE — Telephone Encounter (Signed)
  Chief Complaint: Dysuria Symptoms: Dysuria, frequency, urgency Frequency: Friday Pertinent Negatives: Patient denies fever, lower back, flank pain Disposition: '[]'$ ED /'[]'$ Urgent Care (no appt availability in office) / '[x]'$ Appointment(In office/virtual)/ '[]'$  [facility]/ '[]'$ Home Care/ '[]'$ Refused Recommended Disposition /'[]'$ [facility]/ '[]'$  Follow-up with PCP Additional Notes: Appt secured for tomorrow. Unable to make it to appt today. Care advise provided, verbalizes understanding.

## 2022-07-10 NOTE — Telephone Encounter (Signed)
Reason for Disposition  All other males with painful urination  Answer Assessment - Initial Assessment Questions 1. SEVERITY: "How bad is the pain?"  (e.g., Scale 1-10; mild, moderate, or severe)   - MILD (1-3): Complains slightly about urination hurting.   - MODERATE (4-7): Interferes with normal activities.     - SEVERE (8-10): Excruciating, unwilling or unable to urinate because of the pain.      moderate 2. FREQUENCY: "How many times have you had painful urination today?"      Each 3. PATTERN: "Is pain present every time you urinate or just sometimes?"      Each time 4. ONSET: "When did the painful urination start?"      Friday 5. FEVER: "Do you have a fever?" If Yes, ask: "What is your temperature, how was it measured, and when did it start?"     No 6. PAST UTI: "Have you had a urine infection before?" If Yes, ask: "When was the last time?" and "What happened that time?"       7. CAUSE: "What do you think is causing the painful urination?"      UTI 8. OTHER SYMPTOMS: "Do you have any other symptoms?" (e.g., flank pain, penis discharge, scrotal pain, blood in urine)     Frequency and urgency, small amounts,  Protocols used: Urination Pain - Male-A-AH

## 2022-07-11 ENCOUNTER — Encounter: Payer: Self-pay | Admitting: Internal Medicine

## 2022-07-11 ENCOUNTER — Ambulatory Visit (INDEPENDENT_AMBULATORY_CARE_PROVIDER_SITE_OTHER): Payer: Medicare Other | Admitting: Internal Medicine

## 2022-07-11 VITALS — BP 128/76 | HR 76 | Ht 68.0 in | Wt 192.0 lb

## 2022-07-11 DIAGNOSIS — S12401A Unspecified nondisplaced fracture of fifth cervical vertebra, initial encounter for closed fracture: Secondary | ICD-10-CM

## 2022-07-11 DIAGNOSIS — N3001 Acute cystitis with hematuria: Secondary | ICD-10-CM

## 2022-07-11 LAB — POCT URINALYSIS DIPSTICK
Bilirubin, UA: NEGATIVE
Glucose, UA: NEGATIVE
Ketones, UA: NEGATIVE
Nitrite, UA: NEGATIVE
Protein, UA: POSITIVE — AB
Spec Grav, UA: 1.02 (ref 1.010–1.025)
Urobilinogen, UA: 0.2 E.U./dL
pH, UA: 6 (ref 5.0–8.0)

## 2022-07-11 MED ORDER — SULFAMETHOXAZOLE-TRIMETHOPRIM 800-160 MG PO TABS: 1.0000 | ORAL_TABLET | Freq: Two times a day (BID) | ORAL | 0 refills | Status: DC

## 2022-07-11 NOTE — Progress Notes (Signed)
Date:  07/11/2022   Name:  Thomas Allen   DOB:  1941/06/17   MRN:  [id number]   Chief Complaint: Urinary Tract Infection  Urinary Tract Infection  This is a new problem. The current episode started in the past 7 days (4 days). The problem occurs every urination. The quality of the pain is described as burning. The pain is at a severity of 3/10. The pain is moderate. There has been no fever. Associated symptoms include frequency and urgency. Pertinent negatives include no chills, discharge, flank pain, hematuria, nausea or vomiting. He has tried increased fluids for the symptoms. The treatment provided no relief. recent cervical fracture and many not be drinking as much fluids    Lab Results  Component Value Date   NA 138 06/12/2022   K 3.8 06/12/2022   CO2 24 06/12/2022   GLUCOSE 198 (H) 06/12/2022   BUN 24 (H) 06/12/2022   CREATININE 0.93 06/12/2022   CALCIUM 9.5 06/12/2022   EGFR 85 10/12/2021   GFRNONAA >60 06/12/2022   Lab Results  Component Value Date   CHOL 166 10/12/2021   HDL 45 10/12/2021   LDLCALC 91 10/12/2021   TRIG 175 (H) 10/12/2021   CHOLHDL 3.7 10/12/2021   Lab Results  Component Value Date   TSH 2.040 02/05/2017   Lab Results  Component Value Date   HGBA1C 5.6 05/18/2022   Lab Results  Component Value Date   WBC 12.6 (H) 06/12/2022   HGB 13.6 06/12/2022   HCT 40.1 06/12/2022   MCV 98.3 06/12/2022   PLT 266 06/12/2022   Lab Results  Component Value Date   ALT 23 06/12/2022   AST 24 06/12/2022   ALKPHOS 80 06/12/2022   BILITOT 0.7 06/12/2022   No results found for: "25OHVITD2", "25OHVITD3", "VD25OH"   Review of Systems  Constitutional:  Negative for chills and fever.  Respiratory:  Negative for shortness of breath.   Cardiovascular:  Negative for chest pain.  Gastrointestinal:  Negative for abdominal pain, nausea and vomiting.  Genitourinary:  Positive for frequency and urgency. Negative for flank pain and hematuria.  Musculoskeletal:   Positive for neck pain.  Psychiatric/Behavioral:  Positive for sleep disturbance. Negative for dysphoric mood. The patient is not nervous/anxious.     Patient Active Problem List   Diagnosis Date Noted   Status post laparoscopic hernia repair 02/02/2022   Moderate protein-calorie malnutrition ([place]) 10/12/2021   Low back strain, initial encounter 01/28/2021   Hand pain, right 08/23/2017   Erectile dysfunction following radiation therapy 06/26/2017   Urinary frequency 06/18/2017   Hyperlipidemia associated with type 2 diabetes mellitus ([place]) 03/12/2017   Asymptomatic PVCs 02/05/2017   Gastroesophageal reflux disease 02/05/2017   Alcohol use disorder 01/19/2016   Type II diabetes mellitus with complication ([place]) 18/48/5927   Carpal tunnel syndrome 03/30/2015   Elevated blood pressure, situational 03/30/2015   Abnormal LFTs 03/30/2015   H/O adenomatous polyp of colon 03/30/2015   Tobacco use disorder, moderate, in sustained remission 03/30/2015   Prostate cancer ([place]) 09/02/2013   Posterior calcaneal exostosis 02/26/2013   Calcific Achilles tendinitis 02/26/2013    Allergies  Allergen Reactions   Ace Inhibitors Cough    Past Surgical History:  Procedure Laterality Date   APPENDECTOMY     COLONOSCOPY  2008   benign polyps   COLONOSCOPY WITH PROPOFOL N/A 08/07/2017   Procedure: COLONOSCOPY WITH PROPOFOL;  Surgeon: Lollie Sails, MD;  Location: [facility] ENDOSCOPY;  Service: Endoscopy;  Laterality: N/A;  COLONOSCOPY WITH PROPOFOL N/A 12/03/2020   Procedure: COLONOSCOPY WITH PROPOFOL;  Surgeon: Lesly Rubenstein, MD;  Location: [facility] ENDOSCOPY;  Service: Endoscopy;  Laterality: N/A;   ESOPHAGOGASTRODUODENOSCOPY (EGD) WITH PROPOFOL N/A 08/07/2017   Procedure: ESOPHAGOGASTRODUODENOSCOPY (EGD) WITH PROPOFOL;  Surgeon: Lollie Sails, MD;  Location: [facility] ENDOSCOPY;  Service: Endoscopy;  Laterality: N/A;   INSERTION OF MESH Right 01/16/2022   Procedure: INSERTION OF MESH;   Surgeon: [name], MD;  Location: [facility] ORS;  Service: General;  Laterality: Right;   lithopexy     PROSTATE BIOPSY  2012   malignant   REPLACEMENT TOTAL KNEE Bilateral    2009,2015    Social History   Tobacco Use   Smoking status: Former    Types: Cigarettes    Quit date: 1976    Years since quitting: 47.8   Smokeless tobacco: Never  Vaping Use   Vaping Use: Never used  Substance Use Topics   Alcohol use: Yes    Alcohol/week: 14.0 standard drinks of alcohol    Types: 14 Shots of liquor per week    Comment: 2 scotch drinks per night   Drug use: No     Medication list has been reviewed and updated.  Current Meds  Medication Sig   aspirin 325 MG tablet Take 650 mg by mouth at bedtime as needed for moderate pain. Pt take this med for "wrist pain" from fall.   blood glucose meter kit and supplies KIT Dispense based on patient and insurance preference. Use up to four times daily as directed.   calcium carbonate (OS-CAL - DOSED IN MG OF ELEMENTAL CALCIUM) 1250 (500 Ca) MG tablet Take 1 tablet by mouth.   enzalutamide (XTANDI) 40 MG tablet Take 40 mg by mouth in the morning.   lidocaine (LIDODERM) 5 % Place 1 patch onto the skin every 12 (twelve) hours. Remove & Discard patch within 12 hours or as directed by MD   metFORMIN (GLUCOPHAGE) 1000 MG tablet TAKE 1 TABLET BY MOUTH TWICE A DAY (Patient taking differently: Take 1,000 mg by mouth daily with breakfast.)   vitamin B-12 (CYANOCOBALAMIN) 1000 MCG tablet Take 1,000 mcg by mouth every Monday, Wednesday, and Friday.       05/18/2022   10:56 AM 01/09/2022    9:49 AM 10/12/2021    9:34 AM 01/28/2021    1:33 PM  GAD 7 : Generalized Anxiety Score  Nervous, Anxious, on Edge 0 0 0 0  Control/stop worrying 0 0 0 0  Worry too much - different things 0 0 0 0  Trouble relaxing 0 0 0 0  Restless 0 0 0 0  Easily annoyed or irritable 0 0 0 0  Afraid - awful might happen 0 0 0 0  Total GAD 7 Score 0 0 0 0  Anxiety Difficulty Not  difficult at all Not difficult at all  Not difficult at all       05/18/2022   10:56 AM 01/09/2022    9:49 AM 12/21/2021    1:39 PM  Depression screen PHQ 2/9  Decreased Interest 0 0 0  Down, Depressed, Hopeless 0 0 0  PHQ - 2 Score 0 0 0  Altered sleeping 1 2   Tired, decreased energy 0 0   Change in appetite 0 0   Feeling bad or failure about yourself  0 0   Trouble concentrating 0 0   Moving slowly or fidgety/restless 0 0   Suicidal thoughts 0 0   PHQ-9 Score  1 2   Difficult doing work/chores Not difficult at all Not difficult at all     BP Readings from Last 3 Encounters:  07/11/22 128/76  07/04/22 120/72  06/12/22 (!) 171/88    Physical Exam Neck:     Comments: Cervical collar in place Cardiovascular:     Rate and Rhythm: Normal rate and regular rhythm.  Pulmonary:     Breath sounds: No wheezing or rhonchi.  Abdominal:     Palpations: Abdomen is soft.     Tenderness: There is no abdominal tenderness. There is no right CVA tenderness or left CVA tenderness.  Neurological:     Mental Status: He is alert.   Urine dipstick shows positive for WBC's, positive for RBC's, positive for nitrates, and positive for leukocytes.     Wt Readings from Last 3 Encounters:  07/11/22 192 lb (87.1 kg)  07/04/22 192 lb (87.1 kg)  06/12/22 180 lb (81.6 kg)    BP 128/76 (BP Location: Right Arm, Patient Position: Sitting, Cuff Size: Normal)   Pulse 76   Ht 5' 8" (1.727 m)   Wt 192 lb (87.1 kg)   SpO2 96%   BMI 29.19 kg/m   Assessment and Plan: 1. Acute cystitis with hematuria Continue to push fluids Can take AZO for several days for discomfort Will notify if Urine Cx return resistant to Bactrim - Urine Culture - POCT Urinalysis Dipstick - sulfamethoxazole-trimethoprim (BACTRIM DS) 800-160 MG tablet; Take 1 tablet by mouth 2 (two) times daily for 10 days.  Dispense: 20 tablet; Refill: 0  2. Closed nondisplaced fracture of fifth cervical vertebra, unspecified fracture  morphology, initial encounter [facility]) Being followed by [facility] He is compliant with the use of the collar at all times.   Partially dictated using Editor, commissioning. Any errors are unintentional.  Halina Maidens, MD [facility]  07/11/2022

## 2022-07-11 NOTE — Patient Instructions (Signed)
AZO tablets - can take for painful urination for up to three days

## 2022-07-13 LAB — SPECIMEN STATUS REPORT

## 2022-07-13 LAB — URINE CULTURE

## 2022-07-17 ENCOUNTER — Other Ambulatory Visit: Payer: Medicare Other

## 2022-08-09 NOTE — Progress Notes (Unsigned)
Referring Physician:  Glean Hess, MD [address] [place],  [place] [address]  Primary Physician:  Glean Hess, MD  History of Present Illness:  History of DM, hyperlipidemia, GERD, HTN, and prostate CA.   Thomas Allen is here today for follow up of C5 fracture s/p fall on 06/12/22.   I saw him on 07/04/22 and he was not wearing his collar. We discussed he needed to wear this at all times (not sleeping/eating). At last visit, he had no pain in his neck or arms.   He is here for repeat cervical xrays. ***to evaluate for increased kyphosis      ED follow up. He was seen in ED on 06/12/22 with neck pain s/p fall. CT showed C5 fracture. He was placed in aspen collar and is here for follow up.   He is doing well with no current neck pain. He was wearing brace, but stopped wearing it 2 days ago. He does not have brace with him today.   No arm pain. No numbness, tingling, or weakness in his arms.   Given oxycodone from ED.   History of DM, hyperlipidemia, GERD, HTN, and prostate CA.   The symptoms are causing a significant impact on the patient's life.   Review of Systems:  A 10 point review of systems is negative, except for the pertinent positives and negatives detailed in the HPI.  Past Medical History: Past Medical History:  Diagnosis Date   Arthritis    Cancer ([place])    Diabetes mellitus without complication ([place])    GERD (gastroesophageal reflux disease)    Haglund's deformity    History of kidney stones    Hyperlipidemia    Hypertension    Intractable hiccups 02/05/2017   Prostate CA ([place])    Sleep apnea     Past Surgical History: Past Surgical History:  Procedure Laterality Date   APPENDECTOMY     COLONOSCOPY  2008   benign polyps   COLONOSCOPY WITH PROPOFOL N/A 08/07/2017   Procedure: COLONOSCOPY WITH PROPOFOL;  Surgeon: Lollie Sails, MD;  Location: [facility] ENDOSCOPY;  Service: Endoscopy;  Laterality: N/A;   COLONOSCOPY WITH  PROPOFOL N/A 12/03/2020   Procedure: COLONOSCOPY WITH PROPOFOL;  Surgeon: Lesly Rubenstein, MD;  Location: [facility] ENDOSCOPY;  Service: Endoscopy;  Laterality: N/A;   ESOPHAGOGASTRODUODENOSCOPY (EGD) WITH PROPOFOL N/A 08/07/2017   Procedure: ESOPHAGOGASTRODUODENOSCOPY (EGD) WITH PROPOFOL;  Surgeon: Lollie Sails, MD;  Location: [facility] ENDOSCOPY;  Service: Endoscopy;  Laterality: N/A;   INSERTION OF MESH Right 01/16/2022   Procedure: INSERTION OF MESH;  Surgeon: [name], MD;  Location: [facility] ORS;  Service: General;  Laterality: Right;   lithopexy     PROSTATE BIOPSY  2012   malignant   REPLACEMENT TOTAL KNEE Bilateral    [id number]    Allergies: Allergies as of 08/10/2022 - Review Complete 07/11/2022  Allergen Reaction Noted   Ace inhibitors Cough 01/19/2016    Medications: Outpatient Encounter Medications as of 08/10/2022  Medication Sig   aspirin 325 MG tablet Take 650 mg by mouth at bedtime as needed for moderate pain. Pt take this med for "wrist pain" from fall.   blood glucose meter kit and supplies KIT Dispense based on patient and insurance preference. Use up to four times daily as directed.   calcium carbonate (OS-CAL - DOSED IN MG OF ELEMENTAL CALCIUM) 1250 (500 Ca) MG tablet Take 1 tablet by mouth.   enzalutamide (XTANDI) 40 MG tablet Take 40  mg by mouth in the morning.   lidocaine (LIDODERM) 5 % Place 1 patch onto the skin every 12 (twelve) hours. Remove & Discard patch within 12 hours or as directed by MD   metFORMIN (GLUCOPHAGE) 1000 MG tablet TAKE 1 TABLET BY MOUTH TWICE A DAY (Patient taking differently: Take 1,000 mg by mouth daily with breakfast.)   vitamin B-12 (CYANOCOBALAMIN) 1000 MCG tablet Take 1,000 mcg by mouth every Monday, Wednesday, and Friday.   No facility-administered encounter medications on file as of 08/10/2022.    Social History: Social History   Tobacco Use   Smoking status: Former    Types: Cigarettes    Quit date: 1976    Years since  quitting: 47.9   Smokeless tobacco: Never  Vaping Use   Vaping Use: Never used  Substance Use Topics   Alcohol use: Yes    Alcohol/week: 14.0 standard drinks of alcohol    Types: 14 Shots of liquor per week    Comment: 2 scotch drinks per night   Drug use: No    Family Medical History: Family History  Problem Relation Age of Onset   Diabetes Mother    Colon cancer Mother    Colon cancer Father    Prostate cancer Neg Hx    Bladder Cancer Neg Hx    Kidney cancer Neg Hx     Physical Examination: There were no vitals filed for this visit.    Awake, alert, oriented to person, place, and time.  Speech is clear and fluent. Fund of knowledge is appropriate.   Cranial Nerves: Pupils equal round and reactive to light.  Facial tone is symmetric.    ROM of cervical spine not tested.   No posterior cervical tenderness noted.   Strength: Side Biceps Triceps Deltoid Interossei Grip Wrist Ext. Wrist Flex.  R _0 L _1 Side Iliopsoas Quads Hamstring PF DF EHL  R _2 L _3 Reflexes are 1+ and symmetric at the biceps, triceps, brachioradialis, patella and achilles.   Hoffman's is absent.  Clonus is not present.   Bilateral upper and lower extremity sensation is intact to light touch.     Gait is normal.    Medical Decision Making  Imaging: Cervical xrays dated ***:  ***  Xray report not available for my review. Xrays reviewed with Dr. Izora Ribas. ***   Assessment and Plan: Mr. Balthazor is a pleasant 81 y.o. male with no current neck or arm pain. He has known C5 fracture s/p fall on 06/12/22.   Cervical xrays from today look stable.   Treatment options discussed with patient and following plan made:   - He needs to be back in his aspen cervical collar. We discussed he would likely be in this for 3 months total. He can remove to sleep and eat. He has collar at home.  - No lifting over 5 lbs. No overhead lifting.  - Follow up in 4  weeks with repeat cervical xrays to make sure he does not develop increased kyphosis.   I spent a total of 20 minutes in face-to-face and non-face-to-face activities related to this patient's care toda including review of outside records, review of imaging, review of symptoms, physical exam, discussion of differential diagnosis, discussion of treatment options, and documentation.   Thank you for involving me in the care of this patient.  Geronimo Boot PA-C Dept. of Neurosurgery

## 2022-08-10 ENCOUNTER — Ambulatory Visit
Admission: RE | Admit: 2022-08-10 | Discharge: 2022-08-10 | Disposition: A | Discharge status: Home / Self Care | Payer: Medicare Other | Source: Ambulatory Visit | Attending: Orthopedic Surgery | Admitting: Orthopedic Surgery

## 2022-08-10 ENCOUNTER — Ambulatory Visit (INDEPENDENT_AMBULATORY_CARE_PROVIDER_SITE_OTHER): Payer: Medicare Other | Admitting: Orthopedic Surgery

## 2022-08-10 ENCOUNTER — Ambulatory Visit
Admission: RE | Admit: 2022-08-10 | Discharge: 2022-08-10 | Disposition: A | Discharge status: Home / Self Care | Payer: Medicare Other | Attending: Orthopedic Surgery | Admitting: Orthopedic Surgery

## 2022-08-10 ENCOUNTER — Encounter: Payer: Self-pay | Admitting: Orthopedic Surgery

## 2022-08-10 VITALS — BP 118/68 | Ht 68.0 in | Wt 192.0 lb

## 2022-08-10 DIAGNOSIS — W19XXXD Unspecified fall, subsequent encounter: Secondary | ICD-10-CM | POA: Diagnosis not present

## 2022-08-10 DIAGNOSIS — S12491D Other nondisplaced fracture of fifth cervical vertebra, subsequent encounter for fracture with routine healing: Secondary | ICD-10-CM

## 2022-08-10 DIAGNOSIS — M40202 Unspecified kyphosis, cervical region: Secondary | ICD-10-CM | POA: Diagnosis not present

## 2022-08-10 DIAGNOSIS — S12491A Other nondisplaced fracture of fifth cervical vertebra, initial encounter for closed fracture: Secondary | ICD-10-CM | POA: Insufficient documentation

## 2022-08-10 DIAGNOSIS — S12400A Unspecified displaced fracture of fifth cervical vertebra, initial encounter for closed fracture: Secondary | ICD-10-CM | POA: Diagnosis not present

## 2022-08-10 NOTE — Patient Instructions (Signed)
It was so nice to see you today.   Continue to wear your collar. You can remove it to sleep and to eat.   No lifting over 5-8 lbs. Okay to read your 8 lb book.   You have a follow up with me in 4 weeks. You will need to go across the street to get xrays prior to seeing me.   Please do not hesitate to call if you have any questions or concerns. You can also message me in [place].   Geronimo Boot PA-C [phone]

## 2022-08-14 ENCOUNTER — Ambulatory Visit: Payer: Self-pay | Admitting: *Deleted

## 2022-08-14 ENCOUNTER — Other Ambulatory Visit: Payer: Self-pay

## 2022-08-14 ENCOUNTER — Telehealth: Payer: Self-pay | Admitting: Internal Medicine

## 2022-08-14 DIAGNOSIS — N3001 Acute cystitis with hematuria: Secondary | ICD-10-CM

## 2022-08-14 MED ORDER — SULFAMETHOXAZOLE-TRIMETHOPRIM 800-160 MG PO TABS: 1.0000 | ORAL_TABLET | Freq: Two times a day (BID) | ORAL | 0 refills | Status: AC

## 2022-08-14 NOTE — Telephone Encounter (Signed)
Pt following up on refill request, and phone call he received from office. Per Levada Dy, pt aware abx will be refilled. In addition, Dr Carolin Coy has put a referral to a urologist. Pt verbalized understanding.

## 2022-08-14 NOTE — Telephone Encounter (Signed)
    Summary: possible bladder infection   Pt states his bladder infection has returned and requesting a Rx  Please assist further      Chief Complaint: pain with urination requesting medication Symptoms: treated for UTI 3-4 weeks ago completed course of medication prescribed and reports sx came back worse now. Pain with every urination. Difficulty strating to urinate. Has been taking AZO without relief. Hx knee replacements years ago no other artificial joints Frequency: na  Pertinent Negatives: Patient denies fever, no blood in urine. No odor reported. Disposition: '[]'$ ED /'[x]'$ Urgent Care (no appt availability in office) / '[]'$ Appointment(In office/virtual)/ '[]'$  [facility]/ '[]'$ Home Care/ '[]'$ Refused Recommended Disposition /'[]'$  Mobile Bus/ '[x]'$  Follow-up with PCP Additional Notes:   Patient reports no transportation at this time. Patient broke neck unable to drive , son unavailable to drive. Requesting medication to treat urinary sx that is different than 3-4 weeks ago because medication did not get rid of issues. Please advise patient would like a call back with PCP recommendations.      Reason for Disposition  [1] SEVERE pain with urination (e.g., excruciating) AND [2] not improved after 2 hours of pain medicine (e.g., acetaminophen or ibuprofen)  Answer Assessment - Initial Assessment Questions 1. SEVERITY: "How bad is the pain?"  (e.g., Scale 1-10; mild, moderate, or severe)   - MILD (1-3): Complains slightly about urination hurting.   - MODERATE (4-7): Interferes with normal activities.     - SEVERE (8-10): Excruciating, unwilling or unable to urinate because of the pain.      severe 2. FREQUENCY: "How many times have you had painful urination today?"      Every time urinating  3. PATTERN: "Is pain present every time you urinate or just sometimes?"      Yes  4. ONSET: "When did the painful urination start?"      Had issues and treated 3-4 weeks ago and now feels  worse 5. FEVER: "Do you have a fever?" If Yes, ask: "What is your temperature, how was it measured, and when did it start?"     No  6. PAST UTI: "Have you had a urine infection before?" If Yes, ask: "When was the last time?" and "What happened that time?"      Yes . 3-4 weeks ago completed antibiotics and sx returned worse 7. CAUSE: "What do you think is causing the painful urination?"      UTI/ bladder infection 8. OTHER SYMPTOMS: "Do you have any other symptoms?" (e.g., flank pain, penis discharge, scrotal pain, blood in urine)     Pian with urination every urination. No blood in urine no back or flank pain  Protocols used: Urination Pain - Male-A-AH

## 2022-08-14 NOTE — Telephone Encounter (Signed)
Refill on antibiotic sent in and referral to urology placed.  KP

## 2022-08-23 DIAGNOSIS — Z23 Encounter for immunization: Secondary | ICD-10-CM | POA: Diagnosis not present

## 2022-08-23 DIAGNOSIS — Z79899 Other long term (current) drug therapy: Secondary | ICD-10-CM | POA: Diagnosis not present

## 2022-08-23 DIAGNOSIS — Z79818 Long term (current) use of other agents affecting estrogen receptors and estrogen levels: Secondary | ICD-10-CM | POA: Diagnosis not present

## 2022-08-23 DIAGNOSIS — Z87891 Personal history of nicotine dependence: Secondary | ICD-10-CM | POA: Diagnosis not present

## 2022-08-23 DIAGNOSIS — C61 Malignant neoplasm of prostate: Secondary | ICD-10-CM | POA: Diagnosis not present

## 2022-08-25 ENCOUNTER — Other Ambulatory Visit: Payer: Self-pay | Admitting: Internal Medicine

## 2022-08-25 ENCOUNTER — Ambulatory Visit: Payer: Self-pay

## 2022-08-25 DIAGNOSIS — N3001 Acute cystitis with hematuria: Secondary | ICD-10-CM

## 2022-08-25 MED ORDER — SULFAMETHOXAZOLE-TRIMETHOPRIM 800-160 MG PO TABS: 1.0000 | ORAL_TABLET | Freq: Two times a day (BID) | ORAL | 0 refills | Status: AC

## 2022-08-25 NOTE — Telephone Encounter (Addendum)
Dr. Army Melia notified and will call in antibiotic. Pt. Notified.   Chief Complaint: Painful urination. Treated 2 weeks ago. Cannot come into office for visit. Asking for antibiotic to be sent in. Symptoms: Mild pain with urination Frequency: Yesterday Pertinent Negatives: Patient denies fever Disposition: '[]'$ ED /'[]'$ Urgent Care (no appt availability in office) / '[]'$ Appointment(In office/virtual)/ '[]'$  [facility]/ '[]'$ Home Care/ '[]'$ Refused Recommended Disposition /'[]'$ [place] [facility]/ '[x]'$  Follow-up with PCP Additional Notes:   Answer Assessment - Initial Assessment Questions 1. SYMPTOM: "What's the main symptom you're concerned about?" (e.g., frequency, incontinence)     Painful urination 2. ONSET: "When did the    start?"     Yesterday 3. PAIN: "Is there any pain?" If Yes, ask: "How bad is it?" (Scale: 1-10; mild, moderate, severe)     3 4. CAUSE: "What do you think is causing the symptoms?"     UTI 5. OTHER SYMPTOMS: "Do you have any other symptoms?" (e.g., blood in urine, fever, flank pain, pain with urination)     No 6. PREGNANCY: "Is there any chance you are pregnant?" "When was your last menstrual period?"     N/a  Protocols used: Urinary Symptoms-A-AH

## 2022-09-05 ENCOUNTER — Ambulatory Visit: Payer: Self-pay

## 2022-09-05 ENCOUNTER — Ambulatory Visit (INDEPENDENT_AMBULATORY_CARE_PROVIDER_SITE_OTHER): Payer: Medicare Other | Admitting: Urology

## 2022-09-05 ENCOUNTER — Encounter: Payer: Self-pay | Admitting: Urology

## 2022-09-05 VITALS — BP 110/74 | HR 86 | Ht 68.0 in | Wt 184.0 lb

## 2022-09-05 DIAGNOSIS — Z8546 Personal history of malignant neoplasm of prostate: Secondary | ICD-10-CM

## 2022-09-05 DIAGNOSIS — R319 Hematuria, unspecified: Secondary | ICD-10-CM

## 2022-09-05 DIAGNOSIS — N3001 Acute cystitis with hematuria: Secondary | ICD-10-CM

## 2022-09-05 LAB — MICROSCOPIC EXAMINATION: WBC, UA: >30 /hpf — AB (ref 0–5)

## 2022-09-05 LAB — URINALYSIS, COMPLETE
Bilirubin, UA: NEGATIVE
Glucose, UA: NEGATIVE
Ketones, UA: NEGATIVE
Nitrite, UA: NEGATIVE
Protein,UA: NEGATIVE
Specific Gravity, UA: 1.015 (ref 1.005–1.030)
Urobilinogen, Ur: 0.2 mg/dL (ref 0.2–1.0)
pH, UA: 5 (ref 5.0–7.5)

## 2022-09-05 MED ORDER — CEPHALEXIN 500 MG PO CAPS: 500.0000 mg | ORAL_CAPSULE | Freq: Three times a day (TID) | ORAL | 0 refills | Status: DC

## 2022-09-05 NOTE — Telephone Encounter (Signed)
  Chief Complaint: Information only Symptoms: none Frequency: Pertinent Negatives: Patient denies  Disposition: '[]'$ ED /'[]'$ Urgent Care (no appt availability in office) / '[]'$ Appointment(In office/virtual)/ '[]'$  [facility]/ '[]'$ Home Care/ '[]'$ Refused Recommended Disposition /'[]'$ [place] [facility]/ '[x]'$  Follow-up with PCP Additional Notes: Spoke with [name]. Thomas Allen wanted to know how many Uti's her husband has had recently and the treatment.      Summary: wants to know frequency of antibodic treatment   Pt wife is wanting to know how many times her husband has had an antibodic for UTI, does not know the name [phone] Call [facility]       Reason for Disposition  Health Information question, no triage required and triager able to answer question  Answer Assessment - Initial Assessment Questions 1. REASON FOR CALL or QUESTION: "What is your reason for calling today?" or "How can I best help you?" or "What question do you have that I can help answer?"     Wanted to know how many UTI's pt has had recently.  Protocols used: Information Only Call - No Triage-A-AH

## 2022-09-05 NOTE — Progress Notes (Signed)
I, Jeanmarie Hubert Maxie,acting as a scribe for Hollice Espy, MD.,have documented all relevant documentation on the behalf of Hollice Espy, MD,as directed by  Hollice Espy, MD while in the presence of Hollice Espy, MD.   09/05/22 2:33 PM   Thomas Allen [birth year] [id number]  Referring provider: Glean Hess, MD [address] [place],  [place] [address]  Chief Complaint  Patient presents with   New Patient (Initial Visit)    HPI: 82 year-old male with a personal history of prostate cancer, s/p IMRT ADT who presents today with possible recurrent urinary tract infection.   He was seen and evaluated by his PCP on 07/11/22 with four days of dysuria. He had a point of care urinalysis at that time that showed 2 plus leukocytes. He was given Bactrim and urine culture was negative. He called in December x 2 with recurrent symptoms and they refilled his antibiotics. No urinalysis or urine culture was repeated at that time.  His script for Bactrim was refilled each time.  He is having difficulty driving due to neck brace.  He was last seen by [place] in 2018. At that time, he was also being seen for urinary urgency/frequency after starting Jardiance. He was treated conservatively.  His prostate cancer was treated at [facility] in 2015 by EBRT. His most recent PSA on 08/23/22 remains undetectable.  He recurrent several years ago and is now managed on [place] at [facility].  Today's urinalysis shows greater than 30 WBC's, 3-10 RBC's, moderate bacteria.   Today he mainly complaints of frequency which has been ongoing since he was diagnosed with prostate cancer. He notes mild burning.    PMH: Past Medical History:  Diagnosis Date   Arthritis    Cancer ([place])    Diabetes mellitus without complication ([place])    GERD (gastroesophageal reflux disease)    Haglund's deformity    History of kidney stones    Hyperlipidemia    Hypertension    Intractable hiccups 02/05/2017   Prostate CA [facility])    Sleep  apnea     Surgical History: Past Surgical History:  Procedure Laterality Date   APPENDECTOMY     COLONOSCOPY  2008   benign polyps   COLONOSCOPY WITH PROPOFOL N/A 08/07/2017   Procedure: COLONOSCOPY WITH PROPOFOL;  Surgeon: Lollie Sails, MD;  [facility] ENDOSCOPY;  Service: Endoscopy;  Laterality: N/A;   COLONOSCOPY WITH PROPOFOL N/A 12/03/2020   Procedure: COLONOSCOPY WITH PROPOFOL;  Surgeon: Lesly Rubenstein, MD;  Location: [facility] ENDOSCOPY;  Service: Endoscopy;  Laterality: N/A;   ESOPHAGOGASTRODUODENOSCOPY (EGD) WITH PROPOFOL N/A 08/07/2017   Procedure: ESOPHAGOGASTRODUODENOSCOPY (EGD) WITH PROPOFOL;  Surgeon: Lollie Sails, MD;  Location: [facility] ENDOSCOPY;  Service: Endoscopy;  Laterality: N/A;   INSERTION OF MESH Right 01/16/2022   Procedure: INSERTION OF MESH;  Surgeon: [name], MD;  Location: [facility] ORS;  Service: General;  Laterality: Right;   lithopexy     PROSTATE BIOPSY  2012   malignant   REPLACEMENT TOTAL KNEE Bilateral    [id number]    Home Medications:  Allergies as of 09/05/2022       Reactions   Ace Inhibitors Cough        Medication List        Accurate as of September 05, 2022  2:33 PM. If you have any questions, ask your nurse or doctor.          aspirin 325 MG tablet Take 650 mg by mouth at bedtime as  needed for moderate pain. Pt take this med for "wrist pain" from fall.   blood glucose meter kit and supplies Kit Dispense based on patient and insurance preference. Use up to four times daily as directed.   calcium carbonate 1250 (500 Ca) MG tablet Commonly known as: OS-CAL - dosed in mg of elemental calcium Take 1 tablet by mouth.   cephALEXin 500 MG capsule Commonly known as: Keflex Take 1 capsule (500 mg total) by mouth 3 (three) times daily. Started by: Hollice Espy, MD   cyanocobalamin 1000 MCG tablet Commonly known as: VITAMIN B12 Take 1,000 mcg by mouth every Monday, Wednesday, and Friday.   enzalutamide 40 MG  tablet Commonly known as: XTANDI Take 40 mg by mouth in the morning.   lidocaine 5 % Commonly known as: Lidoderm Place 1 patch onto the skin every 12 (twelve) hours. Remove & Discard patch within 12 hours or as directed by MD   metFORMIN 1000 MG tablet Commonly known as: GLUCOPHAGE TAKE 1 TABLET BY MOUTH TWICE A DAY What changed: when to take this        Allergies:  Allergies  Allergen Reactions   Ace Inhibitors Cough    Family History: Family History  Problem Relation Age of Onset   Diabetes Mother    Colon cancer Mother    Colon cancer Father    Prostate cancer Neg Hx    Bladder Cancer Neg Hx    Kidney cancer Neg Hx     Social History:  reports that he quit smoking about 48 years ago. His smoking use included cigarettes. He has never used smokeless tobacco. He reports current alcohol use of about 14.0 standard drinks of alcohol per week. He reports that he does not use drugs.   Physical Exam: BP 110/74   Pulse 86   Ht _0  (1.727 m)   Wt 184 lb (83.5 kg)   BMI 27.98 kg/m   Constitutional:  Alert and oriented, No acute distress. HEENT: [place] AT, moist mucus membranes.  Trachea midline, no masses. Neurologic: Grossly intact, no focal deficits, moving all 4 extremities. Psychiatric: Normal mood and affect.  Laboratory Data:  Urinalysis Results for orders placed or performed in visit on 09/05/22  Microscopic Examination   Urine  Result Value Ref Range   WBC, UA >30 (A) 0 - 5 /hpf   RBC, Urine 3-10 (A) 0 - 2 /hpf   Epithelial Cells (non renal) 0-10 0 - 10 /hpf   Bacteria, UA Moderate (A) None seen/Few  Urinalysis, Complete  Result Value Ref Range   Specific Gravity, UA 1.015 1.005 - 1.030   pH, UA 5.0 5.0 - 7.5   Color, UA Yellow Yellow   Appearance Ur Hazy (A) Clear   Leukocytes,UA 2+ (A) Negative   Protein,UA Negative Negative/Trace   Glucose, UA Negative Negative   Ketones, UA Negative Negative   RBC, UA Trace (A) Negative   Bilirubin, UA Negative  Negative   Urobilinogen, Ur 0.2 0.2 - 1.0 mg/dL   Nitrite, UA Negative Negative   Microscopic Examination See below:      Assessment & Plan:    History of prostate cancer No evidence of disease, managed on Xt[place] at [facility]  2. Acute cystitis - We'll treat him with another round of antibiotics, Keflex 500 mg 3 times a day for 2-week course - Send urine culture - Will recheck in 2 weeks if his symptoms fail to resolve at that point, will consider cysto in light of his  history of bladder radiation to rule out additional underlying etiologies especially in light of previous negative urine culture and lack of additional UA/urine culture data  Return in about 2 weeks (around 09/19/2022).  I have reviewed the above documentation for accuracy and completeness, and I agree with the above.   Hollice Espy, MD   [facility] [address], [place], [place] [address] [phone]

## 2022-09-06 NOTE — Progress Notes (Unsigned)
Referring Physician:  Glean Hess, MD [address] [place],  [place] [address]  Primary Physician:  Glean Hess, MD  History of Present Illness:  History of DM, hyperlipidemia, GERD, HTN, and prostate CA.   [name] is here today for follow up of C5 fracture s/p fall on 06/12/22.   I saw him last on 08/10/22. He had no neck or arm pain and was wearing his collar.   He is here for follow up.   He is wearing his collar. His neck feels stiff, but he has no neck pain. No arm pain. No numbness, tingling, or weakness.    Review of Systems:  A 10 point review of systems is negative, except for the pertinent positives and negatives detailed in the HPI.  Past Medical History: Past Medical History:  Diagnosis Date   Arthritis    Cancer ([place])    Diabetes mellitus without complication ([place])    GERD (gastroesophageal reflux disease)    Haglund's deformity    History of kidney stones    Hyperlipidemia    Hypertension    Intractable hiccups 02/05/2017   Prostate CA ([place])    Sleep apnea     Past Surgical History: Past Surgical History:  Procedure Laterality Date   APPENDECTOMY     COLONOSCOPY  2008   benign polyps   COLONOSCOPY WITH PROPOFOL N/A 08/07/2017   Procedure: COLONOSCOPY WITH PROPOFOL;  Surgeon: Lollie Sails, MD;  Location: [facility] ENDOSCOPY;  Service: Endoscopy;  Laterality: N/A;   COLONOSCOPY WITH PROPOFOL N/A 12/03/2020   Procedure: COLONOSCOPY WITH PROPOFOL;  Surgeon: Lesly Rubenstein, MD;  Location: [facility] ENDOSCOPY;  Service: Endoscopy;  Laterality: N/A;   ESOPHAGOGASTRODUODENOSCOPY (EGD) WITH PROPOFOL N/A 08/07/2017   Procedure: ESOPHAGOGASTRODUODENOSCOPY (EGD) WITH PROPOFOL;  Surgeon: Lollie Sails, MD;  Location: [facility] ENDOSCOPY;  Service: Endoscopy;  Laterality: N/A;   INSERTION OF MESH Right 01/16/2022   Procedure: INSERTION OF MESH;  Surgeon: [name], MD;  Location: [facility] ORS;  Service: General;  Laterality: Right;    lithopexy     PROSTATE BIOPSY  2012   malignant   REPLACEMENT TOTAL KNEE Bilateral    [id number]    Allergies: Allergies as of 09/07/2022 - Review Complete 09/07/2022  Allergen Reaction Noted   Ace inhibitors Cough 01/19/2016    Medications: Outpatient Encounter Medications as of 09/07/2022  Medication Sig   aspirin 325 MG tablet Take 650 mg by mouth at bedtime as needed for moderate pain. Pt take this med for "wrist pain" from fall.   blood glucose meter kit and supplies KIT Dispense based on patient and insurance preference. Use up to four times daily as directed.   calcium carbonate (OS-CAL - DOSED IN MG OF ELEMENTAL CALCIUM) 1250 (500 Ca) MG tablet Take 1 tablet by mouth.   cephALEXin (KEFLEX) 500 MG capsule Take 1 capsule (500 mg total) by mouth 3 (three) times daily.   enzalutamide (XTANDI) 40 MG tablet Take 40 mg by mouth in the morning.   lidocaine (LIDODERM) 5 % Place 1 patch onto the skin every 12 (twelve) hours. Remove & Discard patch within 12 hours or as directed by MD   metFORMIN (GLUCOPHAGE) 1000 MG tablet TAKE 1 TABLET BY MOUTH TWICE A DAY (Patient taking differently: Take 1,000 mg by mouth daily with breakfast.)   vitamin B-12 (CYANOCOBALAMIN) 1000 MCG tablet Take 1,000 mcg by mouth every Monday, Wednesday, and Friday.   No facility-administered encounter medications on file as of  09/07/2022.    Social History: Social History   Tobacco Use   Smoking status: Former    Types: Cigarettes    Quit date: 1976    Years since quitting: 48.0   Smokeless tobacco: Never  Vaping Use   Vaping Use: Never used  Substance Use Topics   Alcohol use: Yes    Alcohol/week: 14.0 standard drinks of alcohol    Types: 14 Shots of liquor per week    Comment: 2 scotch drinks per night   Drug use: No    Family Medical History: Family History  Problem Relation Age of Onset   Diabetes Mother    Colon cancer Mother    Colon cancer Father    Prostate cancer Neg Hx    Bladder Cancer  Neg Hx    Kidney cancer Neg Hx     Physical Examination: Vitals:   09/07/22 1151  BP: 121/71  Pulse: 83    Awake, alert, oriented to person, place, and time.  Speech is clear and fluent. Fund of knowledge is appropriate.   Cranial Nerves: Pupils equal round and reactive to light.    ROM of cervical spine not tested.   No posterior cervical tenderness noted.   Strength: Side Biceps Triceps Deltoid Interossei Grip Wrist Ext. Wrist Flex.  R _0 L _1 Reflexes are 1+ and symmetric at the biceps, triceps, brachioradialis.  Hoffman's is absent.   Bilateral upper extremity sensation is intact to light touch.     Gait is normal.    Medical Decision Making  Imaging: Cervical xrays dated 09/07/22:  Stable C5 fracture. No instability noted.   Xray report not available for my review. Xrays reviewed with Dr. Izora Ribas.   Assessment and Plan: [name] is a pleasant 82 y.o. male with no current neck or arm pain. He has known C5 fracture s/p fall on 06/12/22.   Cervical xrays from today look stable with no instability.   Treatment options discussed with patient and following plan made:   - He can discontinue the cervical collar.  - Would still be careful with heavy lifting, don't recommend anything over 15-20 lbs.  - Can increase activity slowly as tolerated.  - He can follow up prn.   I spent a total of 10 minutes in face-to-face and non-face-to-face activities related to this patient's care toda including review of outside records, review of imaging, review of symptoms, physical exam, discussion of differential diagnosis, discussion of treatment options, and documentation.   Geronimo Boot PA-C Dept. of Neurosurgery

## 2022-09-07 ENCOUNTER — Encounter: Payer: Self-pay | Admitting: Orthopedic Surgery

## 2022-09-07 ENCOUNTER — Ambulatory Visit
Admission: RE | Admit: 2022-09-07 | Discharge: 2022-09-07 | Disposition: A | Discharge status: Home / Self Care | Payer: Medicare Other | Source: Ambulatory Visit | Attending: Orthopedic Surgery | Admitting: Orthopedic Surgery

## 2022-09-07 ENCOUNTER — Ambulatory Visit (INDEPENDENT_AMBULATORY_CARE_PROVIDER_SITE_OTHER): Payer: Medicare Other | Admitting: Orthopedic Surgery

## 2022-09-07 ENCOUNTER — Ambulatory Visit
Admission: RE | Admit: 2022-09-07 | Discharge: 2022-09-07 | Disposition: A | Discharge status: Home / Self Care | Payer: Medicare Other | Attending: Orthopedic Surgery | Admitting: Orthopedic Surgery

## 2022-09-07 VITALS — BP 121/71 | HR 83 | Wt 192.0 lb

## 2022-09-07 DIAGNOSIS — S12400D Unspecified displaced fracture of fifth cervical vertebra, subsequent encounter for fracture with routine healing: Secondary | ICD-10-CM | POA: Diagnosis not present

## 2022-09-07 DIAGNOSIS — M40202 Unspecified kyphosis, cervical region: Secondary | ICD-10-CM | POA: Diagnosis not present

## 2022-09-07 DIAGNOSIS — S12491D Other nondisplaced fracture of fifth cervical vertebra, subsequent encounter for fracture with routine healing: Secondary | ICD-10-CM

## 2022-09-07 DIAGNOSIS — M2578 Osteophyte, vertebrae: Secondary | ICD-10-CM | POA: Diagnosis not present

## 2022-09-07 DIAGNOSIS — W19XXXD Unspecified fall, subsequent encounter: Secondary | ICD-10-CM

## 2022-09-08 LAB — CULTURE, URINE COMPREHENSIVE

## 2022-09-20 ENCOUNTER — Ambulatory Visit: Payer: Medicare Other | Admitting: Internal Medicine

## 2022-09-21 ENCOUNTER — Ambulatory Visit: Payer: Medicare Other | Admitting: Urology

## 2022-09-21 ENCOUNTER — Encounter: Payer: Self-pay | Admitting: Urology

## 2022-10-13 ENCOUNTER — Ambulatory Visit (INDEPENDENT_AMBULATORY_CARE_PROVIDER_SITE_OTHER): Payer: Medicare Other | Admitting: Internal Medicine

## 2022-10-13 ENCOUNTER — Encounter: Payer: Self-pay | Admitting: Internal Medicine

## 2022-10-13 VITALS — BP 128/70 | HR 87 | Ht 68.0 in | Wt 199.0 lb

## 2022-10-13 DIAGNOSIS — N3000 Acute cystitis without hematuria: Secondary | ICD-10-CM

## 2022-10-13 DIAGNOSIS — R030 Elevated blood-pressure reading, without diagnosis of hypertension: Secondary | ICD-10-CM | POA: Diagnosis not present

## 2022-10-13 DIAGNOSIS — E118 Type 2 diabetes mellitus with unspecified complications: Secondary | ICD-10-CM

## 2022-10-13 DIAGNOSIS — K219 Gastro-esophageal reflux disease without esophagitis: Secondary | ICD-10-CM

## 2022-10-13 LAB — POCT GLYCOSYLATED HEMOGLOBIN (HGB A1C): Hemoglobin A1C: 5.8 % — AB (ref 4.0–5.6)

## 2022-10-13 MED ORDER — METFORMIN HCL 1000 MG PO TABS: 1000.0000 mg | ORAL_TABLET | Freq: Every day | ORAL | 1 refills | Status: DC

## 2022-10-13 MED ORDER — OMEPRAZOLE 20 MG PO CPDR: 20.0000 mg | DELAYED_RELEASE_CAPSULE | Freq: Every day | ORAL | 1 refills | Status: DC

## 2022-10-13 NOTE — Assessment & Plan Note (Addendum)
BS well controlled on metformin daily Last A1C 5.6 with no hypoglycemia A1C 5.8 today with mild weight gain Continue metformin once a day

## 2022-10-13 NOTE — Assessment & Plan Note (Signed)
Symptoms well controlled on daily PPI No red flag signs such as weight loss, n/v, melena Will continue omeprazole - will refill.

## 2022-10-13 NOTE — Assessment & Plan Note (Addendum)
Currently not taking any medications; he was on losartan until 2022 BP have been controlled so will continue to monitor with lifestyle changes

## 2022-10-13 NOTE — Progress Notes (Signed)
Date:  10/13/2022   Name:  Thomas Allen   DOB:  11/06/1940   MRN:  [id number]   Chief Complaint: Diabetes  Diabetes He presents for his follow-up diabetic visit. He has type 2 diabetes mellitus. His disease course has been stable. Pertinent negatives for hypoglycemia include no headaches, nervousness/anxiousness or tremors. Pertinent negatives for diabetes include no chest pain, no fatigue, no polydipsia and no polyuria. Current diabetic treatment includes oral agent (monotherapy) (metformin). When asked about meal planning, he reported none. An ACE inhibitor/angiotensin II receptor blocker is not being taken.  Hypertension The problem has been waxing and waning since onset. Pertinent negatives include no chest pain, headaches, palpitations or shortness of breath. Past treatments include angiotensin blockers. The current treatment provides significant improvement. Compliance problems: patient declined medication.   Urinary Tract Infection  This is a new problem. The current episode started 1 to 4 weeks ago. The problem occurs every urination. The problem has been gradually improving. The quality of the pain is described as aching. The pain is mild. There has been no fever. Associated symptoms include urgency. Pertinent negatives include no hematuria. Treatments tried: recently complete keflex from Urology.    Lab Results  Component Value Date   NA 138 06/12/2022   K 3.8 06/12/2022   CO2 24 06/12/2022   GLUCOSE 198 (H) 06/12/2022   BUN 24 (H) 06/12/2022   CREATININE 0.93 06/12/2022   CALCIUM 9.5 06/12/2022   EGFR 85 10/12/2021   GFRNONAA >60 06/12/2022   Lab Results  Component Value Date   CHOL 166 10/12/2021   HDL 45 10/12/2021   LDLCALC 91 10/12/2021   TRIG 175 (H) 10/12/2021   CHOLHDL 3.7 10/12/2021   Lab Results  Component Value Date   TSH 2.040 02/05/2017   Lab Results  Component Value Date   HGBA1C 5.8 (A) 10/13/2022   Lab Results  Component Value Date   WBC 12.6  (H) 06/12/2022   HGB 13.6 06/12/2022   HCT 40.1 06/12/2022   MCV 98.3 06/12/2022   PLT 266 06/12/2022   Lab Results  Component Value Date   ALT 23 06/12/2022   AST 24 06/12/2022   ALKPHOS 80 06/12/2022   BILITOT 0.7 06/12/2022   No results found for: "25OHVITD2", "25OHVITD3", "VD25OH"   Review of Systems  Constitutional:  Negative for appetite change, fatigue and unexpected weight change.  Eyes:  Negative for visual disturbance.  Respiratory:  Negative for cough, shortness of breath and wheezing.   Cardiovascular:  Negative for chest pain, palpitations and leg swelling.  Gastrointestinal:  Negative for abdominal pain and blood in stool.  Endocrine: Negative for polydipsia and polyuria.  Genitourinary:  Positive for dysuria and urgency. Negative for hematuria and testicular pain.  Skin:  Negative for color change and rash.  Neurological:  Negative for tremors, numbness and headaches.  Psychiatric/Behavioral:  Negative for dysphoric mood and sleep disturbance. The patient is not nervous/anxious.     Patient Active Problem List   Diagnosis Date Noted   Status post laparoscopic hernia repair 02/02/2022   Moderate protein-calorie malnutrition ([place]) 10/12/2021   Low back strain, initial encounter 01/28/2021   Hand pain, right 08/23/2017   Erectile dysfunction following radiation therapy 06/26/2017   Urinary frequency 06/18/2017   Hyperlipidemia associated with type 2 diabetes mellitus ([place]) 03/12/2017   Asymptomatic PVCs 02/05/2017   Gastroesophageal reflux disease 02/05/2017   Alcohol use disorder 01/19/2016   Type II diabetes mellitus with complication ([place]) 123456   Carpal  tunnel syndrome 03/30/2015   Elevated blood pressure, situational 03/30/2015   Abnormal LFTs 03/30/2015   H/O adenomatous polyp of colon 03/30/2015   Tobacco use disorder, moderate, in sustained remission 03/30/2015   Prostate cancer ([place]) 09/02/2013   Posterior calcaneal exostosis 02/26/2013    Calcific Achilles tendinitis 02/26/2013    Allergies  Allergen Reactions   Ace Inhibitors Cough    Past Surgical History:  Procedure Laterality Date   APPENDECTOMY     COLONOSCOPY  2008   benign polyps   COLONOSCOPY WITH PROPOFOL N/A 08/07/2017   Procedure: COLONOSCOPY WITH PROPOFOL;  Surgeon: Lollie Sails, MD;  Location: [facility] ENDOSCOPY;  Service: Endoscopy;  Laterality: N/A;   COLONOSCOPY WITH PROPOFOL N/A 12/03/2020   Procedure: COLONOSCOPY WITH PROPOFOL;  Surgeon: Lesly Rubenstein, MD;  Location: [facility] ENDOSCOPY;  Service: Endoscopy;  Laterality: N/A;   ESOPHAGOGASTRODUODENOSCOPY (EGD) WITH PROPOFOL N/A 08/07/2017   Procedure: ESOPHAGOGASTRODUODENOSCOPY (EGD) WITH PROPOFOL;  Surgeon: Lollie Sails, MD;  Location: [facility] ENDOSCOPY;  Service: Endoscopy;  Laterality: N/A;   INSERTION OF MESH Right 01/16/2022   Procedure: INSERTION OF MESH;  Surgeon: [name], MD;  Location: [facility] ORS;  Service: General;  Laterality: Right;   lithopexy     PROSTATE BIOPSY  2012   malignant   REPLACEMENT TOTAL KNEE Bilateral    2009,2015    Social History   Tobacco Use   Smoking status: Former    Types: Cigarettes    Quit date: 1976    Years since quitting: 48.1   Smokeless tobacco: Never  Vaping Use   Vaping Use: Never used  Substance Use Topics   Alcohol use: Yes    Alcohol/week: 14.0 standard drinks of alcohol    Types: 14 Shots of liquor per week    Comment: 2 scotch drinks per night   Drug use: No     Medication list has been reviewed and updated.  Current Meds  Medication Sig   aspirin 325 MG tablet Take 650 mg by mouth at bedtime as needed for moderate pain. Pt take this med for "wrist pain" from fall.   blood glucose meter kit and supplies KIT Dispense based on patient and insurance preference. Use up to four times daily as directed.   calcium carbonate (OS-CAL - DOSED IN MG OF ELEMENTAL CALCIUM) 1250 (500 Ca) MG tablet Take 1 tablet by mouth.    enzalutamide (XTANDI) 40 MG tablet Take 40 mg by mouth in the morning.   lidocaine (LIDODERM) 5 % Place 1 patch onto the skin every 12 (twelve) hours. Remove & Discard patch within 12 hours or as directed by MD   vitamin B-12 (CYANOCOBALAMIN) 1000 MCG tablet Take 1,000 mcg by mouth every Monday, Wednesday, and Friday.   [DISCONTINUED] cephALEXin (KEFLEX) 500 MG capsule Take 1 capsule (500 mg total) by mouth 3 (three) times daily.   [DISCONTINUED] metFORMIN (GLUCOPHAGE) 1000 MG tablet TAKE 1 TABLET BY MOUTH TWICE A DAY (Patient taking differently: Take 1,000 mg by mouth daily with breakfast.)       05/18/2022   10:56 AM 01/09/2022    9:49 AM 10/12/2021    9:34 AM 01/28/2021    1:33 PM  GAD 7 : Generalized Anxiety Score  Nervous, Anxious, on Edge 0 0 0 0  Control/stop worrying 0 0 0 0  Worry too much - different things 0 0 0 0  Trouble relaxing 0 0 0 0  Restless 0 0 0 0  Easily annoyed or irritable 0 0 0  0  Afraid - awful might happen 0 0 0 0  Total GAD 7 Score 0 0 0 0  Anxiety Difficulty Not difficult at all Not difficult at all  Not difficult at all       05/18/2022   10:56 AM 01/09/2022    9:49 AM 12/21/2021    1:39 PM  Depression screen PHQ 2/9  Decreased Interest 0 0 0  Down, Depressed, Hopeless 0 0 0  PHQ - 2 Score 0 0 0  Altered sleeping 1 2   Tired, decreased energy 0 0   Change in appetite 0 0   Feeling bad or failure about yourself  0 0   Trouble concentrating 0 0   Moving slowly or fidgety/restless 0 0   Suicidal thoughts 0 0   PHQ-9 Score 1 2   Difficult doing work/chores Not difficult at all Not difficult at all     BP Readings from Last 3 Encounters:  10/13/22 128/70  09/07/22 121/71  09/05/22 110/74    Physical Exam Vitals and nursing note reviewed.  Constitutional:      General: He is not in acute distress.    Appearance: Normal appearance. He is well-developed.  HENT:     Head: Normocephalic and atraumatic.  Neck:     Vascular: No carotid bruit.   Cardiovascular:     Rate and Rhythm: Normal rate and regular rhythm.  Pulmonary:     Effort: Pulmonary effort is normal. No respiratory distress.  Musculoskeletal:     Right lower leg: No edema.     Left lower leg: No edema.  Lymphadenopathy:     Cervical: No cervical adenopathy.  Skin:    General: Skin is warm and dry.     Findings: No rash.  Neurological:     Mental Status: He is alert and oriented to person, place, and time.  Psychiatric:        Mood and Affect: Mood normal.        Behavior: Behavior normal.     Wt Readings from Last 3 Encounters:  10/13/22 199 lb (90.3 kg)  09/07/22 192 lb (87.1 kg)  09/05/22 184 lb (83.5 kg)    BP 128/70   Pulse 87   Ht 5' 8"$  (1.727 m)   Wt 199 lb (90.3 kg)   SpO2 96%   BMI 30.26 kg/m   Assessment and Plan: Problem List Items Addressed This Visit       Cardiovascular and Mediastinum   Elevated blood pressure, situational (Chronic)    Currently not taking any medications; he was on losartan until 2022 BP have been controlled so will continue to monitor with lifestyle changes         Digestive   Gastroesophageal reflux disease (Chronic)    Symptoms well controlled on daily PPI No red flag signs such as weight loss, n/v, melena Will continue omeprazole - will refill.       Relevant Medications   omeprazole (PRILOSEC) 20 MG capsule     Endocrine   Type II diabetes mellitus with complication (HCC) - Primary (Chronic)    BS well controlled on metformin daily Last A1C 5.6 with no hypoglycemia A1C 5.8 today with mild weight gain Continue metformin once a day      Relevant Medications   metFORMIN (GLUCOPHAGE) 1000 MG tablet   Other Relevant Orders   POCT glycosylated hemoglobin (Hb A1C) (Completed)   Microalbumin / creatinine urine ratio   Other Visit Diagnoses  Acute cystitis without hematuria       completed Keflex; UA clear recommend follow up with Urology if symptoms worsen        Partially dictated  using [place]. Any errors are unintentional.  Halina Maidens, MD [facility]  10/13/2022

## 2022-10-15 LAB — MICROALBUMIN / CREATININE URINE RATIO
Creatinine, Urine: 38.9 mg/dL
Microalb/Creat Ratio: <8 mg/g creat (ref 0–29)
Microalbumin, Urine: <3 ug/mL

## 2022-10-20 ENCOUNTER — Telehealth: Payer: Self-pay

## 2022-10-20 ENCOUNTER — Ambulatory Visit: Payer: Self-pay

## 2022-10-20 ENCOUNTER — Ambulatory Visit: Payer: Medicare Other | Admitting: Family Medicine

## 2022-10-20 NOTE — Telephone Encounter (Signed)
Patient called and spoke with the [facility] nurse and was advised to come in with in 4 hours. Our only available appt is with Dr Ronnald Ramp at [phone]. We offered the patient this appointment but patient is refusing to come in. I told patient he may need to see ED or UC over the weekend if symptoms worsen and he said "okay."  Patient was scheduled by front office for 10:20 AM Monday and told if his symptoms worsen to please go to ER or ED.  Juliannah Ohmann

## 2022-10-20 NOTE — Telephone Encounter (Signed)
Chief Complaint: Diarrhea all week. Also cold s/s. Symptoms: nasal discharge and cough at night Frequency: Sunday Pertinent Negatives: Patient denies fever Disposition: []$ ED /[]$ Urgent Care (no appt availability in office) / [x]$ Appointment(In office/virtual)/ []$  [facility]/ []$ Home Care/ []$ Refused Recommended Disposition /[]$ [facility]/ []$  Follow-up with PCP Additional Notes: PT 's wife concerned about pt. PT was in room. Pt has diarrhea all wee. Most of the days the diarrhea stopped around noon. Today he has had 4BMs and it has gone past 12pm.  Wife believes that pt is dehydrated.  PT also has cold s/s.   Summary: diarrhea / stomach discomfort   The patient's wife has stressed the urgency of a response  ----- Message from Marlis Edelson sent at 10/20/2022  3:09 PM EST ----- The patient has experienced diarrhea multiple times this week  Th patient has also had a cold for roughly 1 week  The patient would like to be prescribed something for their discomfort  Please contact further when possible       Reason for Disposition  [1] SEVERE diarrhea (e.g., 7 or more times / day more than normal) AND [2] age > 60 years  Answer Assessment - Initial Assessment Questions 1. DIARRHEA SEVERITY: "How bad is the diarrhea?" "How many more stools have you had in the past 24 hours than normal?"    - NO DIARRHEA (SCALE 0)   - MILD (SCALE 1-3): Few loose or mushy BMs; increase of 1-3 stools over normal daily number of stools; mild increase in ostomy output.   -  MODERATE (SCALE 4-7): Increase of 4-6 stools daily over normal; moderate increase in ostomy output.   -  SEVERE (SCALE 8-10; OR "WORST POSSIBLE"): Increase of 7 or more stools daily over normal; moderate increase in ostomy output; incontinence.     4x - usually has 2 bms a day 2. ONSET: "When did the diarrhea begin?"      Monday 3. BM CONSISTENCY: "How loose or watery is the diarrhea?"      Very loose watery 4.  VOMITING: "Are you also vomiting?" If Yes, ask: "How many times in the past 24 hours?"      no 5. ABDOMEN PAIN: "Are you having any abdomen pain?" If Yes, ask: "What does it feel like?" (e.g., crampy, dull, intermittent, constant)      no 6. ABDOMEN PAIN SEVERITY: If present, ask: "How bad is the pain?"  (e.g., Scale 1-10; mild, moderate, or severe)   - MILD (1-3): doesn't interfere with normal activities, abdomen soft and not tender to touch    - MODERATE (4-7): interferes with normal activities or awakens from sleep, abdomen tender to touch    - SEVERE (8-10): excruciating pain, doubled over, unable to do any normal activities       none 7. ORAL INTAKE: If vomiting, "Have you been able to drink liquids?" "How much liquids have you had in the past 24 hours?"     yes 8. HYDRATION: "Any signs of dehydration?" (e.g., dry mouth [not just dry lips], too weak to stand, dizziness, new weight loss) "When did you last urinate?"     1/2 hour ago - May be dehydrated 9. EXPOSURE: "Have you traveled to a foreign country recently?" "Have you been exposed to anyone with diarrhea?" "Could you have eaten any food that was spoiled?"     no 10. ANTIBIOTIC USE: "Are you taking antibiotics now or have you taken antibiotics in the past 2 months?"  Yes 11. OTHER SYMPTOMS: "Do you have any other symptoms?" (e.g., fever, blood in stool)       Cold s/s 12. PREGNANCY: "Is there any chance you are pregnant?" "When was your last menstrual period?"  Protocols used: [facility]

## 2022-10-23 ENCOUNTER — Ambulatory Visit: Payer: Medicare Other | Admitting: Internal Medicine

## 2022-10-31 ENCOUNTER — Ambulatory Visit: Payer: Medicare Other | Admitting: Urology

## 2022-10-31 ENCOUNTER — Encounter: Payer: Self-pay | Admitting: Urology

## 2022-11-01 ENCOUNTER — Telehealth: Payer: Self-pay | Admitting: Urology

## 2022-11-01 ENCOUNTER — Ambulatory Visit (INDEPENDENT_AMBULATORY_CARE_PROVIDER_SITE_OTHER): Payer: Medicare Other | Admitting: Urology

## 2022-11-01 VITALS — BP 125/76 | HR 81 | Ht 68.0 in | Wt 194.5 lb

## 2022-11-01 DIAGNOSIS — R350 Frequency of micturition: Secondary | ICD-10-CM | POA: Diagnosis not present

## 2022-11-01 DIAGNOSIS — Z8546 Personal history of malignant neoplasm of prostate: Secondary | ICD-10-CM | POA: Diagnosis not present

## 2022-11-01 DIAGNOSIS — N3001 Acute cystitis with hematuria: Secondary | ICD-10-CM | POA: Diagnosis not present

## 2022-11-01 DIAGNOSIS — R300 Dysuria: Secondary | ICD-10-CM

## 2022-11-01 DIAGNOSIS — L570 Actinic keratosis: Secondary | ICD-10-CM | POA: Diagnosis not present

## 2022-11-01 DIAGNOSIS — L821 Other seborrheic keratosis: Secondary | ICD-10-CM | POA: Diagnosis not present

## 2022-11-01 DIAGNOSIS — L738 Other specified follicular disorders: Secondary | ICD-10-CM | POA: Diagnosis not present

## 2022-11-01 DIAGNOSIS — X32XXXA Exposure to sunlight, initial encounter: Secondary | ICD-10-CM | POA: Diagnosis not present

## 2022-11-01 MED ORDER — GEMTESA 75 MG PO TABS: 1.0000 | ORAL_TABLET | Freq: Every day | ORAL | 0 refills | Status: DC

## 2022-11-01 NOTE — Progress Notes (Signed)
Thomas Allen,acting as a scribe for Thomas Espy, MD.,have documented all relevant documentation on the behalf of Thomas Espy, MD,as directed by  Thomas Espy, MD while in the presence of Thomas Espy, MD.  11/01/2022 4:32 PM   Thomas Allen 1940-12-31 [id number]  Referring provider: Glean Hess, MD [address] [place],  [place] [address]  Chief Complaint  Patient presents with   cystitis follow up    HPI: 82 year-old male with a personal history of prostate cancer, s/p IMRT ADT who returns today for follow up with UA.  He is currently on Xtandi managed by Thomas Allen for his prostate cancer.   He was referred to [place] for further evaluation of recurrent urinary tract symptoms and infection. When he was seen and evaluated by me in January, he did have evidence of bacteruria/cystitis. He was treated with a 2 week course of Thomas Allen. He is here today to reassess his urinary symptoms.   His urinalysis today is negative.   Today, he complains of continued urgency frequency and dysuria that he describes as a burning sensation that started several years ago. He completed the course of Thomas Allen with little improvement in his symptoms. He notes that he had a cystoscopy approximately 15 years ago.   He notes that he is currently having diarrhea that began a month ago. He is not taking probiotics.   PMH: Past Medical History:  Diagnosis Date   Arthritis    Cancer ([place])    Diabetes mellitus without complication ([place])    GERD (gastroesophageal reflux disease)    Haglund's deformity    History of kidney stones    Hyperlipidemia    Hypertension    Intractable hiccups 02/05/2017   Prostate CA [facility])    Sleep apnea     Surgical History: Past Surgical History:  Procedure Laterality Date   APPENDECTOMY     COLONOSCOPY  2008   benign polyps   COLONOSCOPY WITH PROPOFOL N/A 08/07/2017   Procedure: COLONOSCOPY WITH PROPOFOL;  Surgeon: Lollie Sails, MD;  Location:  [facility] ENDOSCOPY;  Service: Endoscopy;  Laterality: N/A;   COLONOSCOPY WITH PROPOFOL N/A 12/03/2020   Procedure: COLONOSCOPY WITH PROPOFOL;  Surgeon: Lesly Rubenstein, MD;  Location: [facility] ENDOSCOPY;  Service: Endoscopy;  Laterality: N/A;   ESOPHAGOGASTRODUODENOSCOPY (EGD) WITH PROPOFOL N/A 08/07/2017   Procedure: ESOPHAGOGASTRODUODENOSCOPY (EGD) WITH PROPOFOL;  Surgeon: Lollie Sails, MD;  Location: [facility] ENDOSCOPY;  Service: Endoscopy;  Laterality: N/A;   INSERTION OF MESH Right 01/16/2022   Procedure: INSERTION OF MESH;  Surgeon: [name], MD;  Location: [facility] ORS;  Service: General;  Laterality: Right;   lithopexy     PROSTATE BIOPSY  2012   malignant   REPLACEMENT TOTAL KNEE Bilateral    [id number]    Home Medications:  Allergies as of 11/01/2022       Reactions   Ace Inhibitors Cough        Medication List        Accurate as of November 01, 2022  4:32 PM. If you have any questions, ask your nurse or doctor.          STOP taking these medications    aspirin 325 MG tablet Stopped by: Thomas Espy, MD       TAKE these medications    blood glucose meter kit and supplies Kit Dispense based on patient and insurance preference. Use up to four times daily as directed.   calcium carbonate 1250 (500 Ca) MG  tablet Commonly known as: OS-CAL - dosed in mg of elemental calcium Take 1 tablet by mouth.   cyanocobalamin 1000 MCG tablet Commonly known as: VITAMIN B12 Take 1,000 mcg by mouth every Monday, Wednesday, and Friday.   enzalutamide 40 MG tablet Commonly known as: XTANDI Take 40 mg by mouth in the morning.   Gemtesa 75 MG Tabs Generic drug: Vibegron Take 1 tablet (75 mg total) by mouth daily. Started by: Thomas Espy, MD   lidocaine 5 % Commonly known as: Lidoderm Place 1 patch onto the skin every 12 (twelve) hours. Remove & Discard patch within 12 hours or as directed by MD   metFORMIN 1000 MG tablet Commonly known as: GLUCOPHAGE Take 1  tablet (1,000 mg total) by mouth daily with breakfast.   omeprazole 20 MG capsule Commonly known as: PRILOSEC Take 1 capsule (20 mg total) by mouth daily.        Allergies:  Allergies  Allergen Reactions   Ace Inhibitors Cough    Family History: Family History  Problem Relation Age of Onset   Diabetes Mother    Colon cancer Mother    Colon cancer Father    Prostate cancer Neg Hx    Bladder Cancer Neg Hx    Kidney cancer Neg Hx     Social History:  reports that he quit smoking about 48 years ago. His smoking use included cigarettes. He has never used smokeless tobacco. He reports current alcohol use of about 14.0 standard drinks of alcohol per week. He reports that he does not use drugs.   Physical Exam: BP 125/76   Pulse 81   Ht '5\' 8"'$  (1.727 m)   Wt 194 lb 8 oz (88.2 kg)   BMI 29.57 kg/m   Constitutional:  Alert and oriented, No acute distress. HEENT: [place] AT, moist mucus membranes.  Trachea midline, no masses. Neurologic: Grossly intact, no focal deficits, moving all 4 extremities. Psychiatric: Normal mood and affect.  Results for orders placed or performed in visit on 11/01/22  Microscopic Examination   Urine  Result Value Ref Range   WBC, UA 0-5 0 - 5 /hpf   RBC, Urine 0-2 0 - 2 /hpf   Epithelial Cells (non renal) >10 (A) 0 - 10 /hpf   Bacteria, UA Few None seen/Few  Urinalysis, Complete  Result Value Ref Range   Specific Gravity, UA 1.025 1.005 - 1.030   pH, UA 5.5 5.0 - 7.5   Color, UA Yellow Yellow   Appearance Ur Hazy (A) Clear   Leukocytes,UA Negative Negative   Protein,UA Negative Negative/Trace   Glucose, UA Negative Negative   Ketones, UA Negative Negative   RBC, UA Negative Negative   Bilirubin, UA Negative Negative   Urobilinogen, Ur 0.2 0.2 - 1.0 mg/dL   Nitrite, UA Negative Negative   Microscopic Examination See below:      Assessment & Plan:    1. Dysuria - His urinalysis is now clear. He has no evidence of ongoing infection.  - In  light of his ongoing urinary symptoms, despite a negative urinalysis, we think it is important to rule out underlying bladder pathology. We will plan for a an office cystoscopy. We discussed the procedure itself.  2. Urinary frequency - We will give him a trial of Gemtesa 75 mg until his follow up appointment to see if it helps with his frequency.  3. Diarrhea - He mentions that he has diarrhea for the last month. It seems to be dietary induced and  comes and goes depending on what he eats.  - It may be related to multiple rounds of antibiotics. - Recommend daily probiotics.  4. History of prostate cancer - No evidence of disease, managed on Xtandi at [facility]  Return for cystoscopy.  I have reviewed the above documentation for accuracy and completeness, and I agree with the above.   Thomas Espy, MD    [facility] [address] [place], [place] [address] [phone]

## 2022-11-01 NOTE — Telephone Encounter (Signed)
Spoke with patient, he was not aware he had an appointment and apologized. Rescheduled for today 11/01/22

## 2022-11-01 NOTE — Telephone Encounter (Signed)
This patient was a no-show for clinic yesterday.  It is very important that we recheck his urine to ensure that the blood in his urine has resolved.  If it has not, he may need further workup to rule out underlying condition such as bladder cancer amongst others.  Please reschedule at minimum for a urine drop-off.  Hollice Espy, MD

## 2022-11-02 LAB — URINALYSIS, COMPLETE
Bilirubin, UA: NEGATIVE
Glucose, UA: NEGATIVE
Ketones, UA: NEGATIVE
Leukocytes,UA: NEGATIVE
Nitrite, UA: NEGATIVE
Protein,UA: NEGATIVE
RBC, UA: NEGATIVE
Specific Gravity, UA: 1.025 (ref 1.005–1.030)
Urobilinogen, Ur: 0.2 mg/dL (ref 0.2–1.0)
pH, UA: 5.5 (ref 5.0–7.5)

## 2022-11-02 LAB — MICROSCOPIC EXAMINATION: Epithelial Cells (non renal): >10 /hpf — AB (ref 0–10)

## 2022-11-10 ENCOUNTER — Telehealth: Payer: Self-pay | Admitting: Internal Medicine

## 2022-11-10 DIAGNOSIS — X32XXXA Exposure to sunlight, initial encounter: Secondary | ICD-10-CM | POA: Diagnosis not present

## 2022-11-10 DIAGNOSIS — L570 Actinic keratosis: Secondary | ICD-10-CM | POA: Diagnosis not present

## 2022-11-10 NOTE — Telephone Encounter (Signed)
Called pt left VM to call back.  KP 

## 2022-11-10 NOTE — Telephone Encounter (Signed)
Copied from [place] [id number]. Topic: Referral - Request for Referral >> Nov 10, 2022 10:41 AM Chapman Fitch wrote: Has patient seen PCP for this complaint? No *If NO, is insurance requiring patient see PCP for this issue before PCP can refer them? Referral for which specialty: Physical Therapy  Preferred provider/office:  Reason for referral: Difficulty bending over / to put shoes on

## 2022-11-10 NOTE — Telephone Encounter (Signed)
Please review.  KP

## 2022-11-13 ENCOUNTER — Telehealth: Payer: Self-pay | Admitting: Internal Medicine

## 2022-11-13 NOTE — Telephone Encounter (Signed)
Copied from [place] [id number]. Topic: Referral - Status >> Nov 13, 2022 11:35 AM Cyndi Bender wrote: Reason for CRM: Pt spouse requests a referral to GI specialist due to pt having diarrhea for the last month and several times during the last few months.

## 2022-11-14 ENCOUNTER — Encounter: Payer: Self-pay | Admitting: Internal Medicine

## 2022-11-14 ENCOUNTER — Ambulatory Visit (INDEPENDENT_AMBULATORY_CARE_PROVIDER_SITE_OTHER): Payer: Medicare Other | Admitting: Internal Medicine

## 2022-11-14 VITALS — BP 100/68 | HR 90 | Ht 68.0 in | Wt 187.0 lb

## 2022-11-14 DIAGNOSIS — R197 Diarrhea, unspecified: Secondary | ICD-10-CM | POA: Insufficient documentation

## 2022-11-14 NOTE — Progress Notes (Signed)
Date:  11/14/2022   Name:  KIX GREENHAGEN   DOB:  Apr 06, 1941   MRN:  [id number]   Chief Complaint: Diarrhea (X 1 month. Having diarrhea every 3-4 days. Last Sunday he had 9 Bms that were all diarrhea. Each time previously he has used imodium. This past Sunday imodium did not help until 3 doses of the medicine.)  Diarrhea  This is a recurrent problem. The current episode started more than 1 month ago. Episode frequency: some days 5 + times and other days formed stools. Progression since onset: severe 2 days ago. The patient states that diarrhea awakens him from sleep. Associated symptoms include weight loss (12 lbs in 6 weeks). Pertinent negatives include no abdominal pain, increased  flatus or vomiting. The symptoms are aggravated by dairy products. There are no known risk factors. He has tried anti-motility drug for the symptoms. The treatment provided moderate relief.    Lab Results  Component Value Date   NA 138 06/12/2022   K 3.8 06/12/2022   CO2 24 06/12/2022   GLUCOSE 198 (H) 06/12/2022   BUN 24 (H) 06/12/2022   CREATININE 0.93 06/12/2022   CALCIUM 9.5 06/12/2022   EGFR 85 10/12/2021   GFRNONAA >60 06/12/2022   Lab Results  Component Value Date   CHOL 166 10/12/2021   HDL 45 10/12/2021   LDLCALC 91 10/12/2021   TRIG 175 (H) 10/12/2021   CHOLHDL 3.7 10/12/2021   Lab Results  Component Value Date   TSH 2.040 02/05/2017   Lab Results  Component Value Date   HGBA1C 5.8 (A) 10/13/2022   Lab Results  Component Value Date   WBC 12.6 (H) 06/12/2022   HGB 13.6 06/12/2022   HCT 40.1 06/12/2022   MCV 98.3 06/12/2022   PLT 266 06/12/2022   Lab Results  Component Value Date   ALT 23 06/12/2022   AST 24 06/12/2022   ALKPHOS 80 06/12/2022   BILITOT 0.7 06/12/2022   No results found for: "25OHVITD2", "25OHVITD3", "VD25OH"   Review of Systems  Constitutional:  Positive for fatigue, unexpected weight change and weight loss (12 lbs in 6 weeks). Negative for appetite  change and diaphoresis.  HENT:  Negative for trouble swallowing.   Respiratory:  Negative for chest tightness and shortness of breath.   Cardiovascular:  Negative for chest pain.  Gastrointestinal:  Positive for diarrhea. Negative for abdominal distention, abdominal pain, blood in stool, flatus, nausea and vomiting.  Psychiatric/Behavioral:  Negative for dysphoric mood and sleep disturbance. The patient is not nervous/anxious.     Patient Active Problem List   Diagnosis Date Noted   Diarrhea 11/14/2022   Status post laparoscopic hernia repair 02/02/2022   Low back strain, initial encounter 01/28/2021   Hand pain, right 08/23/2017   Erectile dysfunction following radiation therapy 06/26/2017   Urinary frequency 06/18/2017   Hyperlipidemia associated with type 2 diabetes mellitus ([place]) 03/12/2017   Asymptomatic PVCs 02/05/2017   Gastroesophageal reflux disease 02/05/2017   Alcohol use disorder 01/19/2016   Type II diabetes mellitus with complication ([place]) 123456   Carpal tunnel syndrome 03/30/2015   Elevated blood pressure, situational 03/30/2015   Abnormal LFTs 03/30/2015   H/O adenomatous polyp of colon 03/30/2015   Tobacco use disorder, moderate, in sustained remission 03/30/2015   Prostate cancer ([place]) 09/02/2013   Posterior calcaneal exostosis 02/26/2013   Calcific Achilles tendinitis 02/26/2013    Allergies  Allergen Reactions   Ace Inhibitors Cough    Past Surgical History:  Procedure Laterality  Date   APPENDECTOMY     COLONOSCOPY  2008   benign polyps   COLONOSCOPY WITH PROPOFOL N/A 08/07/2017   Procedure: COLONOSCOPY WITH PROPOFOL;  Surgeon: Lollie Sails, MD;  Location[facility] ENDOSCOPY;  Service: Endoscopy;  Laterality: N/A;   COLONOSCOPY WITH PROPOFOL N/A 12/03/2020   Procedure: COLONOSCOPY WITH PROPOFOL;  Surgeon: Lesly Rubenstein, MD;  Location: [facility] ENDOSCOPY;  Service: Endoscopy;  Laterality: N/A;   ESOPHAGOGASTRODUODENOSCOPY (EGD) WITH PROPOFOL  N/A 08/07/2017   Procedure: ESOPHAGOGASTRODUODENOSCOPY (EGD) WITH PROPOFOL;  Surgeon: Lollie Sails, MD;  Location: [facility] ENDOSCOPY;  Service: Endoscopy;  Laterality: N/A;   INSERTION OF MESH Right 01/16/2022   Procedure: INSERTION OF MESH;  Surgeon: [name], MD;  Location: [facility] ORS;  Service: General;  Laterality: Right;   lithopexy     PROSTATE BIOPSY  2012   malignant   REPLACEMENT TOTAL KNEE Bilateral    2009,2015    Social History   Tobacco Use   Smoking status: Former    Types: Cigarettes    Quit date: 1976    Years since quitting: 48.2   Smokeless tobacco: Never  Vaping Use   Vaping Use: Never used  Substance Use Topics   Alcohol use: Yes    Alcohol/week: 14.0 standard drinks of alcohol    Types: 14 Shots of liquor per week    Comment: 2 scotch drinks per night   Drug use: No     Medication list has been reviewed and updated.  Current Meds  Medication Sig   blood glucose meter kit and supplies KIT Dispense based on patient and insurance preference. Use up to four times daily as directed.   calcium carbonate (OS-CAL - DOSED IN MG OF ELEMENTAL CALCIUM) 1250 (500 Ca) MG tablet Take 1 tablet by mouth.   enzalutamide (XTANDI) 40 MG tablet Take 40 mg by mouth in the morning.   lidocaine (LIDODERM) 5 % Place 1 patch onto the skin every 12 (twelve) hours. Remove & Discard patch within 12 hours or as directed by MD   loperamide (IMODIUM A-D) 2 MG tablet Take 2 mg by mouth 4 (four) times daily as needed for diarrhea or loose stools.   metFORMIN (GLUCOPHAGE) 1000 MG tablet Take 1 tablet (1,000 mg total) by mouth daily with breakfast.   omeprazole (PRILOSEC) 20 MG capsule Take 1 capsule (20 mg total) by mouth daily.   Vibegron (GEMTESA) 75 MG TABS Take 1 tablet (75 mg total) by mouth daily.   [DISCONTINUED] vitamin B-12 (CYANOCOBALAMIN) 1000 MCG tablet Take 1,000 mcg by mouth every Monday, Wednesday, and Friday.       11/14/2022    4:08 PM 05/18/2022   10:56 AM  01/09/2022    9:49 AM 10/12/2021    9:34 AM  GAD 7 : Generalized Anxiety Score  Nervous, Anxious, on Edge 0 0 0 0  Control/stop worrying 1 0 0 0  Worry too much - different things 1 0 0 0  Trouble relaxing 0 0 0 0  Restless 0 0 0 0  Easily annoyed or irritable 0 0 0 0  Afraid - awful might happen 0 0 0 0  Total GAD 7 Score 2 0 0 0  Anxiety Difficulty Not difficult at all Not difficult at all Not difficult at all        11/14/2022    4:08 PM 05/18/2022   10:56 AM 01/09/2022    9:49 AM  Depression screen PHQ 2/9  Decreased Interest 0 0 0  Down, Depressed, Hopeless 0 0 0  PHQ - 2 Score 0 0 0  Altered sleeping '3 1 2  '$ Tired, decreased energy 3 0 0  Change in appetite 3 0 0  Feeling bad or failure about yourself  0 0 0  Trouble concentrating 0 0 0  Moving slowly or fidgety/restless 0 0 0  Suicidal thoughts 0 0 0  PHQ-9 Score '9 1 2  '$ Difficult doing work/chores Not difficult at all Not difficult at all Not difficult at all    BP Readings from Last 3 Encounters:  11/14/22 100/68  11/01/22 125/76  10/13/22 128/70    Physical Exam Vitals and nursing note reviewed.  Constitutional:      General: He is not in acute distress.    Appearance: Normal appearance. He is well-developed.  HENT:     Head: Normocephalic and atraumatic.  Cardiovascular:     Rate and Rhythm: Normal rate and regular rhythm.  Pulmonary:     Effort: Pulmonary effort is normal. No respiratory distress.     Breath sounds: No wheezing or rhonchi.  Abdominal:     General: Abdomen is flat.     Palpations: Abdomen is soft.     Tenderness: There is no abdominal tenderness.  Musculoskeletal:     Cervical back: Normal range of motion.  Skin:    General: Skin is warm and dry.     Findings: No rash.  Neurological:     Mental Status: He is alert and oriented to person, place, and time.  Psychiatric:        Mood and Affect: Mood normal.        Behavior: Behavior normal.     Wt Readings from Last 3 Encounters:   11/14/22 187 lb (84.8 kg)  11/01/22 194 lb 8 oz (88.2 kg)  10/13/22 199 lb (90.3 kg)    BP 100/68   Pulse 90   Ht '5\' 8"'$  (1.727 m)   Wt 187 lb (84.8 kg)   SpO2 99%   BMI 28.43 kg/m   Assessment and Plan:  Problem List Items Addressed This Visit       Other   Diarrhea - Primary    Onset about 6 weeks ago - severe episodes every few days with normal stools in between Will continue imodium PRN Stop Metformin Obtain labs and stool studies Refer to GI Consider trial off of Xtandi if the above measures are not beneficial/revealing      Relevant Orders   Thyroid Panel With TSH   CBC with Differential/Platelet   Comprehensive metabolic panel   GI Profile, Stool, PCR   Ambulatory referral to Gastroenterology    No follow-ups on file.   Partially dictated using [place], any errors are not intentional.  Glean Hess, MD [place], [place]

## 2022-11-14 NOTE — Assessment & Plan Note (Signed)
Onset about 6 weeks ago - severe episodes every few days with normal stools in between Will continue imodium PRN Stop Metformin Obtain labs and stool studies Refer to GI Consider trial off of Xtandi if the above measures are not beneficial/revealing

## 2022-11-14 NOTE — Patient Instructions (Signed)
Stop Metformin for now.  Bring back the stool sample to [facility] as soon as you can.

## 2022-11-20 DIAGNOSIS — R197 Diarrhea, unspecified: Secondary | ICD-10-CM | POA: Diagnosis not present

## 2022-11-21 LAB — CBC WITH DIFFERENTIAL/PLATELET
Basophils Absolute: 0 10*3/uL (ref 0.0–0.2)
Basos: 0 %
EOS (ABSOLUTE): 0.2 10*3/uL (ref 0.0–0.4)
Eos: 2 %
Hematocrit: 39.1 % (ref 37.5–51.0)
Hemoglobin: 12.9 g/dL — ABNORMAL LOW (ref 13.0–17.7)
Immature Grans (Abs): 0.1 10*3/uL (ref 0.0–0.1)
Immature Granulocytes: 1 %
Lymphocytes Absolute: 1.9 10*3/uL (ref 0.7–3.1)
Lymphs: 25 %
MCH: 31.9 pg (ref 26.6–33.0)
MCHC: 33 g/dL (ref 31.5–35.7)
MCV: 97 fL (ref 79–97)
Monocytes Absolute: 0.8 10*3/uL (ref 0.1–0.9)
Monocytes: 10 %
Neutrophils Absolute: 4.5 10*3/uL (ref 1.4–7.0)
Neutrophils: 62 %
Platelets: 227 10*3/uL (ref 150–450)
RBC: 4.05 x10E6/uL — ABNORMAL LOW (ref 4.14–5.80)
RDW: 12.6 % (ref 11.6–15.4)
WBC: 7.4 10*3/uL (ref 3.4–10.8)

## 2022-11-21 LAB — GI PROFILE, STOOL, PCR

## 2022-11-21 LAB — COMPREHENSIVE METABOLIC PANEL
ALT: 16 IU/L (ref 0–44)
AST: 13 IU/L (ref 0–40)
Albumin/Globulin Ratio: 1.5 (ref 1.2–2.2)
Albumin: 4 g/dL (ref 3.8–4.8)
Alkaline Phosphatase: 88 IU/L (ref 44–121)
BUN/Creatinine Ratio: 22 (ref 10–24)
BUN: 20 mg/dL (ref 8–27)
Bilirubin Total: 0.3 mg/dL (ref 0.0–1.2)
CO2: 23 mmol/L (ref 20–29)
Calcium: 9.8 mg/dL (ref 8.6–10.2)
Chloride: 102 mmol/L (ref 96–106)
Creatinine, Ser: 0.91 mg/dL (ref 0.76–1.27)
Globulin, Total: 2.7 g/dL (ref 1.5–4.5)
Glucose: 133 mg/dL — ABNORMAL HIGH (ref 70–99)
Potassium: 4.3 mmol/L (ref 3.5–5.2)
Sodium: 138 mmol/L (ref 134–144)
Total Protein: 6.7 g/dL (ref 6.0–8.5)
eGFR: 85 mL/min/{1.73_m2} (ref >59)

## 2022-11-21 LAB — THYROID PANEL WITH TSH
Free Thyroxine Index: 1.7 (ref 1.2–4.9)
T3 Uptake Ratio: 30 % (ref 24–39)
T4, Total: 5.6 ug/dL (ref 4.5–12.0)
TSH: 1.62 u[IU]/mL (ref 0.450–4.500)

## 2022-11-22 NOTE — Progress Notes (Signed)
Called pt left VM to call back.  PEC may give results if patient returns call - CRM created.  KP

## 2022-11-22 NOTE — Progress Notes (Signed)
Please review.  KP

## 2022-11-23 ENCOUNTER — Other Ambulatory Visit: Payer: Medicare Other | Admitting: Urology

## 2022-11-23 NOTE — Progress Notes (Signed)
Called pt left VM to call back.  PEC may give results if patient returns call - CRM created.  KP

## 2022-11-27 DIAGNOSIS — C772 Secondary and unspecified malignant neoplasm of intra-abdominal lymph nodes: Secondary | ICD-10-CM | POA: Diagnosis not present

## 2022-11-27 DIAGNOSIS — Z79818 Long term (current) use of other agents affecting estrogen receptors and estrogen levels: Secondary | ICD-10-CM | POA: Diagnosis not present

## 2022-11-27 DIAGNOSIS — Z79899 Other long term (current) drug therapy: Secondary | ICD-10-CM | POA: Diagnosis not present

## 2022-11-27 DIAGNOSIS — C61 Malignant neoplasm of prostate: Secondary | ICD-10-CM | POA: Diagnosis not present

## 2022-12-05 ENCOUNTER — Telehealth: Payer: Self-pay

## 2022-12-05 NOTE — Telephone Encounter (Signed)
Left VM informing GI has been trying to contact him to schedule his appt for diarrhea. GI's # given.  Liridona Mashaw

## 2022-12-05 NOTE — Telephone Encounter (Signed)
-----   Message from Glean Hess, MD sent at 12/05/2022 12:05 PM EDT ----- Call pt about GI referral.  They have not been able to reach him.  Has his diarrhea resolved and he no longer wishes to be seen?  ----- Message ----- From: Shelby Mattocks, CMA Sent: 12/05/2022   9:24 AM EDT To: Glean Hess, MD

## 2022-12-19 NOTE — Progress Notes (Signed)
Referring Physician:  No referring provider defined for this encounter.  Primary Physician:  Thomas Milan, MD  History of Present Illness: 12/20/2022 [name] has a history of DM, hyperlipidemia, GERD, HTN, and prostate CA.   I have seen him in the past for a C5 fracture s/p fall on 06/12/22.   1 month history of constant LBP that is more central. No leg pain. Pain is worse with bending (putting on socks). No alleviating factors.   He has not tried any medications.   He is being treated for frequent urination and diarrhea- this has been going on for months.   Conservative measures:  Physical therapy: has not participated Multimodal medical therapy including regular antiinflammatories: none Injections: has not received epidural steroid injections  Past Surgery: no previous spine surgery  Thomas Allen has no symptoms of cervical myelopathy.  The symptoms are causing a significant impact on the patient's life.   Review of Systems:  A 10 point review of systems is negative, except for the pertinent positives and negatives detailed in the HPI.  Past Medical History: Past Medical History:  Diagnosis Date   Arthritis    Cancer    Diabetes mellitus without complication    GERD (gastroesophageal reflux disease)    Haglund's deformity    History of kidney stones    Hyperlipidemia    Hypertension    Intractable hiccups 02/05/2017   Prostate CA    Sleep apnea     Past Surgical History: Past Surgical History:  Procedure Laterality Date   APPENDECTOMY     COLONOSCOPY  2008   benign polyps   COLONOSCOPY WITH PROPOFOL N/A 08/07/2017   Procedure: COLONOSCOPY WITH PROPOFOL;  Surgeon: Christena Deem, MD;  Location: [facility] ENDOSCOPY;  Service: Endoscopy;  Laterality: N/A;   COLONOSCOPY WITH PROPOFOL N/A 12/03/2020   Procedure: COLONOSCOPY WITH PROPOFOL;  Surgeon: Regis Bill, MD;  Location: [facility] ENDOSCOPY;  Service: Endoscopy;  Laterality: N/A;    ESOPHAGOGASTRODUODENOSCOPY (EGD) WITH PROPOFOL N/A 08/07/2017   Procedure: ESOPHAGOGASTRODUODENOSCOPY (EGD) WITH PROPOFOL;  Surgeon: Christena Deem, MD;  Location: [facility] ENDOSCOPY;  Service: Endoscopy;  Laterality: N/A;   INSERTION OF MESH Right 01/16/2022   Procedure: INSERTION OF MESH;  Surgeon: Campbell Lerner, MD;  Location: [facility] ORS;  Service: General;  Laterality: Right;   lithopexy     PROSTATE BIOPSY  2012   malignant   REPLACEMENT TOTAL KNEE Bilateral    [id number]    Allergies: Allergies as of 12/20/2022 - Review Complete 12/20/2022  Allergen Reaction Noted   Ace inhibitors Cough 01/19/2016    Medications: Outpatient Encounter Medications as of 12/20/2022  Medication Sig   blood glucose meter kit and supplies KIT Dispense based on patient and insurance preference. Use up to four times daily as directed.   calcium carbonate (OS-CAL - DOSED IN MG OF ELEMENTAL CALCIUM) 1250 (500 Ca) MG tablet Take 1 tablet by mouth.   enzalutamide (XTANDI) 40 MG tablet Take 40 mg by mouth in the morning.   lidocaine (LIDODERM) 5 % Place 1 patch onto the skin every 12 (twelve) hours. Remove & Discard patch within 12 hours or as directed by MD (Patient not taking: Reported on 12/20/2022)   loperamide (IMODIUM A-D) 2 MG tablet Take 2 mg by mouth 4 (four) times daily as needed for diarrhea or loose stools.   metFORMIN (GLUCOPHAGE) 1000 MG tablet Take 1 tablet (1,000 mg total) by mouth daily with breakfast.   omeprazole (PRILOSEC) 20 MG  capsule Take 1 capsule (20 mg total) by mouth daily.   Vibegron (GEMTESA) 75 MG TABS Take 1 tablet (75 mg total) by mouth daily.   No facility-administered encounter medications on file as of 12/20/2022.    Social History: Social History   Tobacco Use   Smoking status: Former    Types: Cigarettes    Quit date: 1976    Years since quitting: 48.3   Smokeless tobacco: Never  Vaping Use   Vaping Use: Never used  Substance Use Topics   Alcohol use: Yes     Alcohol/week: 14.0 standard drinks of alcohol    Types: 14 Shots of liquor per week    Comment: 2 scotch drinks per night   Drug use: No    Family Medical History: Family History  Problem Relation Age of Onset   Diabetes Mother    Colon cancer Mother    Colon cancer Father    Prostate cancer Neg Hx    Bladder Cancer Neg Hx    Kidney cancer Neg Hx     Physical Examination: Vitals:   12/20/22 1406  BP: 110/84    General: Patient is well developed, well nourished, calm, collected, and in no apparent distress. Attention to examination is appropriate.  Respiratory: Patient is breathing without any difficulty.   NEUROLOGICAL:     Awake, alert, oriented to person, place, and time.  Speech is clear and fluent. Fund of knowledge is appropriate.   Cranial Nerves: Pupils equal round and reactive to light.  Facial tone is symmetric.    He has mild central lower lumbar tenderness.   He has pain with lumbar flexion and extension with limited ROM.   No abnormal lesions on exposed skin.   Strength: Side Biceps Triceps Deltoid Interossei Grip Wrist Ext. Wrist Flex.  R 5 5 5 5 5 5 5   L 5 5 5 5 5 5 5    Side Iliopsoas Quads Hamstring PF DF EHL  R 5 5 5 5 5 5   L 5 5 5 5 5 5    Reflexes are 2+ and symmetric at the biceps, triceps, brachioradialis, patella and achilles.   Hoffman's is absent.  Clonus is not present.   Bilateral upper and lower extremity sensation is intact to light touch.     Gait is normal.    Medical Decision Making  Imaging: No lumbar imaging.   Assessment and Plan: [name] is a pleasant 82 y.o. male with history of C5 fracture s/p fall on 06/12/22.   1 month history of constant LBP that is more central. No leg pain. Pain is worse with bending (putting on socks).   No lumbar imaging. Pain appears lumbar mediated.   Treatment options discussed with patient and following plan made:   - Lumbar xrays ordered. He will go to [facility].   - He will try OTC lidoderm patches.  - Hold on NSAIDs for now as he is in workup of chronic diarrhea.  - Will call him with xray results and likely will consider PT.   He has left foot pain and thinks he may have glass in his foot. Referral to podiatry.   I spent a total of 20 minutes in face-to-face and non-face-to-face activities related to this patient's care today including review of outside records, review of imaging, review of symptoms, physical exam, discussion of differential diagnosis, discussion of treatment options, and documentation.   Drake Leach PA-C Dept. of Neurosurgery

## 2022-12-20 ENCOUNTER — Ambulatory Visit
Admission: RE | Admit: 2022-12-20 | Discharge: 2022-12-20 | Disposition: A | Discharge status: Home / Self Care | Payer: Medicare Other | Attending: Orthopedic Surgery | Admitting: Orthopedic Surgery

## 2022-12-20 ENCOUNTER — Encounter: Payer: Self-pay | Admitting: Orthopedic Surgery

## 2022-12-20 ENCOUNTER — Ambulatory Visit
Admission: RE | Admit: 2022-12-20 | Discharge: 2022-12-20 | Disposition: A | Discharge status: Home / Self Care | Payer: Medicare Other | Source: Ambulatory Visit | Attending: Orthopedic Surgery | Admitting: Orthopedic Surgery

## 2022-12-20 ENCOUNTER — Ambulatory Visit (INDEPENDENT_AMBULATORY_CARE_PROVIDER_SITE_OTHER): Payer: Medicare Other | Admitting: Orthopedic Surgery

## 2022-12-20 VITALS — BP 110/84 | Ht 68.0 in | Wt 194.2 lb

## 2022-12-20 DIAGNOSIS — M545 Low back pain, unspecified: Secondary | ICD-10-CM | POA: Diagnosis not present

## 2022-12-20 DIAGNOSIS — M79672 Pain in left foot: Secondary | ICD-10-CM | POA: Diagnosis not present

## 2022-12-20 DIAGNOSIS — M5136 Other intervertebral disc degeneration, lumbar region: Secondary | ICD-10-CM | POA: Diagnosis not present

## 2022-12-20 NOTE — Patient Instructions (Signed)
It was so nice to see again you today. Thank you so much for coming in.    I ordered xrays of your lower back. You can get these at [facility] (building with the white pillars) off of [address]. The address is [address], [place], [place] [address]. You do not need any appointment.   I put in a referral for you to see podiatry at the [facility]. They should call you to schedule an appointment or you can call them at [phone].   Once I get the xrays back, we will call to schedule a phone visit to review them.    Please do not hesitate to call if you have any questions or concerns. You can also message me in MyChart.   Drake Leach PA-C [phone]

## 2022-12-27 ENCOUNTER — Ambulatory Visit (INDEPENDENT_AMBULATORY_CARE_PROVIDER_SITE_OTHER): Payer: Medicare Other

## 2022-12-27 VITALS — Ht 68.0 in | Wt 194.0 lb

## 2022-12-27 DIAGNOSIS — Z0000 Encounter for general adult medical examination without abnormal findings: Secondary | ICD-10-CM | POA: Diagnosis not present

## 2022-12-27 NOTE — Patient Instructions (Signed)
[name] , Thank you for taking time to come for your Medicare Wellness Visit. I appreciate your ongoing commitment to your health goals. Please review the following plan we discussed and let me know if I can assist you in the future.   These are the goals we discussed:  Goals      DIET - EAT MORE FRUITS AND VEGETABLES     Patient Stated     Maintain health         This is a list of the screening recommended for you and due dates:  Health Maintenance  Topic Date Due   DTaP/Tdap/Td vaccine (1 - Tdap) Never done   Zoster (Shingles) Vaccine (1 of 2) Never done   COVID-19 Vaccine (3 - Pfizer risk series) 01/14/2020   Eye exam for diabetics  12/25/2020   Pneumonia Vaccine (1 of 2 - PCV) 05/19/2023*   Flu Shot  04/05/2023   Hemoglobin A1C  04/13/2023   Complete foot exam   05/19/2023   Yearly kidney health urinalysis for diabetes  10/14/2023   Yearly kidney function blood test for diabetes  11/20/2023   Colon Cancer Screening  12/04/2023   Medicare Annual Wellness Visit  12/27/2023   HPV Vaccine  Aged Out  *Topic was postponed. The date shown is not the original due date.    Advanced directives: no  Conditions/risks identified: none  Next appointment: Follow up in one year for your annual wellness visit. 01/02/24 @ 1:30 pm by phone  Preventive Care 65 Years and Older, Male  Preventive care refers to lifestyle choices and visits with your health care provider that can promote health and wellness. What does preventive care include? A yearly physical exam. This is also called an annual well check. Dental exams once or twice a year. Routine eye exams. Ask your health care provider how often you should have your eyes checked. Personal lifestyle choices, including: Daily care of your teeth and gums. Regular physical activity. Eating a healthy diet. Avoiding tobacco and drug use. Limiting alcohol use. Practicing safe sex. Taking low doses of aspirin every day. Taking vitamin  and mineral supplements as recommended by your health care provider. What happens during an annual well check? The services and screenings done by your health care provider during your annual well check will depend on your age, overall health, lifestyle risk factors, and family history of disease. Counseling  Your health care provider may ask you questions about your: Alcohol use. Tobacco use. Drug use. Emotional well-being. Home and relationship well-being. Sexual activity. Eating habits. History of falls. Memory and ability to understand (cognition). Work and work Astronomer. Screening  You may have the following tests or measurements: Height, weight, and BMI. Blood pressure. Lipid and cholesterol levels. These may be checked every 5 years, or more frequently if you are over 73 years old. Skin check. Lung cancer screening. You may have this screening every year starting at age 66 if you have a 30-pack-year history of smoking and currently smoke or have quit within the past 15 years. Fecal occult blood test (FOBT) of the stool. You may have this test every year starting at age 37. Flexible sigmoidoscopy or colonoscopy. You may have a sigmoidoscopy every 5 years or a colonoscopy every 10 years starting at age 44. Prostate cancer screening. Recommendations will vary depending on your family history and other risks. Hepatitis C blood test. Hepatitis B blood test. Sexually transmitted disease (STD) testing. Diabetes screening. This is done by checking your  blood sugar (glucose) after you have not eaten for a while (fasting). You may have this done every 1-3 years. Abdominal aortic aneurysm (AAA) screening. You may need this if you are a current or former smoker. Osteoporosis. You may be screened starting at age 71 if you are at high risk. Talk with your health care provider about your test results, treatment options, and if necessary, the need for more tests. Vaccines  Your health care  provider may recommend certain vaccines, such as: Influenza vaccine. This is recommended every year. Tetanus, diphtheria, and acellular pertussis (Tdap, Td) vaccine. You may need a Td booster every 10 years. Zoster vaccine. You may need this after age 37. Pneumococcal 13-valent conjugate (PCV13) vaccine. One dose is recommended after age 93. Pneumococcal polysaccharide (PPSV23) vaccine. One dose is recommended after age 45. Talk to your health care provider about which screenings and vaccines you need and how often you need them. This information is not intended to replace advice given to you by your health care provider. Make sure you discuss any questions you have with your health care provider. Document Released: 09/17/2015 Document Revised: 05/10/2016 Document Reviewed: 06/22/2015 Elsevier Interactive Patient Education  2017 [place] Prevention in the Home Falls can cause injuries. They can happen to people of all ages. There are many things you can do to make your home safe and to help prevent falls. What can I do on the outside of my home? Regularly fix the edges of walkways and driveways and fix any cracks. Remove anything that might make you trip as you walk through a door, such as a raised step or threshold. Trim any bushes or trees on the path to your home. Use bright outdoor lighting. Clear any walking paths of anything that might make someone trip, such as rocks or tools. Regularly check to see if handrails are loose or broken. Make sure that both sides of any steps have handrails. Any raised decks and porches should have guardrails on the edges. Have any leaves, snow, or ice cleared regularly. Use sand or salt on walking paths during winter. Clean up any spills in your garage right away. This includes oil or grease spills. What can I do in the bathroom? Use night lights. Install grab bars by the toilet and in the tub and shower. Do not use towel bars as grab  bars. Use non-skid mats or decals in the tub or shower. If you need to sit down in the shower, use a plastic, non-slip stool. Keep the floor dry. Clean up any water that spills on the floor as soon as it happens. Remove soap buildup in the tub or shower regularly. Attach bath mats securely with double-sided non-slip rug tape. Do not have throw rugs and other things on the floor that can make you trip. What can I do in the bedroom? Use night lights. Make sure that you have a light by your bed that is easy to reach. Do not use any sheets or blankets that are too big for your bed. They should not hang down onto the floor. Have a firm chair that has side arms. You can use this for support while you get dressed. Do not have throw rugs and other things on the floor that can make you trip. What can I do in the kitchen? Clean up any spills right away. Avoid walking on wet floors. Keep items that you use a lot in easy-to-reach places. If you need to reach something above  you, use a strong step stool that has a grab bar. Keep electrical cords out of the way. Do not use floor polish or wax that makes floors slippery. If you must use wax, use non-skid floor wax. Do not have throw rugs and other things on the floor that can make you trip. What can I do with my stairs? Do not leave any items on the stairs. Make sure that there are handrails on both sides of the stairs and use them. Fix handrails that are broken or loose. Make sure that handrails are as long as the stairways. Check any carpeting to make sure that it is firmly attached to the stairs. Fix any carpet that is loose or worn. Avoid having throw rugs at the top or bottom of the stairs. If you do have throw rugs, attach them to the floor with carpet tape. Make sure that you have a light switch at the top of the stairs and the bottom of the stairs. If you do not have them, ask someone to add them for you. What else can I do to help prevent  falls? Wear shoes that: Do not have high heels. Have rubber bottoms. Are comfortable and fit you well. Are closed at the toe. Do not wear sandals. If you use a stepladder: Make sure that it is fully opened. Do not climb a closed stepladder. Make sure that both sides of the stepladder are locked into place. Ask someone to hold it for you, if possible. Clearly mark and make sure that you can see: Any grab bars or handrails. First and last steps. Where the edge of each step is. Use tools that help you move around (mobility aids) if they are needed. These include: Canes. Walkers. Scooters. Crutches. Turn on the lights when you go into a dark area. Replace any light bulbs as soon as they burn out. Set up your furniture so you have a clear path. Avoid moving your furniture around. If any of your floors are uneven, fix them. If there are any pets around you, be aware of where they are. Review your medicines with your doctor. Some medicines can make you feel dizzy. This can increase your chance of falling. Ask your doctor what other things that you can do to help prevent falls. This information is not intended to replace advice given to you by your health care provider. Make sure you discuss any questions you have with your health care provider. Document Released: 06/17/2009 Document Revised: 01/27/2016 Document Reviewed: 09/25/2014 Elsevier Interactive Patient Education  2017 [employer].

## 2022-12-27 NOTE — Progress Notes (Signed)
I connected with  Thomas Allen on 12/27/22 by a audio enabled telemedicine application and verified that I am speaking with the correct person using two identifiers.  Patient Location: Home  Provider Location: Office/Clinic  I discussed the limitations of evaluation and management by telemedicine. The patient expressed understanding and agreed to proceed.  Subjective:   Thomas Allen is a 82 y.o. male who presents for Medicare Annual/Subsequent preventive examination.  Review of Systems     Cardiac Risk Factors include: advanced age (>84men, >71 women);male gender;diabetes mellitus;dyslipidemia;hypertension     Objective:    Today's Vitals   12/27/22 1328  PainSc: 7    There is no height or weight on file to calculate BMI.     12/27/2022    1:32 PM 06/12/2022    5:02 AM 01/16/2022   12:13 PM 01/12/2022   11:36 AM 12/21/2021    1:42 PM 02/28/2021    2:30 PM 01/05/2021    3:05 PM  Advanced Directives  Does Patient Have a Medical Advance Directive? Yes Yes Yes Yes Yes No Yes  Type of Estate agent of [place];Living will Healthcare Power of [place];Living will Healthcare Power of [place];Living will  Healthcare Power of [place];Living will  Healthcare Power of Attorney  Does patient want to make changes to medical advance directive? No - Patient declined  No - Patient declined No - Patient declined     Copy of Healthcare Power of Attorney in Chart? Yes - validated most recent copy scanned in chart (See row information)  No - copy requested  Yes - validated most recent copy scanned in chart (See row information)    Would patient like information on creating a medical advance directive?      No - Patient declined     Current Medications (verified) Outpatient Encounter Medications as of 12/27/2022  Medication Sig   blood glucose meter kit and supplies KIT Dispense based on patient and insurance preference. Use up to four times daily as directed.   calcium  carbonate (OS-CAL - DOSED IN MG OF ELEMENTAL CALCIUM) 1250 (500 Ca) MG tablet Take 1 tablet by mouth.   enzalutamide (XTANDI) 40 MG tablet Take 40 mg by mouth in the morning.   lidocaine (LIDODERM) 5 % Place 1 patch onto the skin every 12 (twelve) hours. Remove & Discard patch within 12 hours or as directed by MD   loperamide (IMODIUM A-D) 2 MG tablet Take 2 mg by mouth 4 (four) times daily as needed for diarrhea or loose stools.   metFORMIN (GLUCOPHAGE) 1000 MG tablet Take 1 tablet (1,000 mg total) by mouth daily with breakfast.   omeprazole (PRILOSEC) 20 MG capsule Take 1 capsule (20 mg total) by mouth daily.   Vibegron (GEMTESA) 75 MG TABS Take 1 tablet (75 mg total) by mouth daily.   No facility-administered encounter medications on file as of 12/27/2022.    Allergies (verified) Ace inhibitors   History: Past Medical History:  Diagnosis Date   Arthritis    Cancer    Diabetes mellitus without complication    GERD (gastroesophageal reflux disease)    Haglund's deformity    History of kidney stones    Hyperlipidemia    Hypertension    Intractable hiccups 02/05/2017   Prostate CA    Sleep apnea    Past Surgical History:  Procedure Laterality Date   APPENDECTOMY     COLONOSCOPY  2008   benign polyps   COLONOSCOPY WITH PROPOFOL N/A 08/07/2017  Procedure: COLONOSCOPY WITH PROPOFOL;  Surgeon: Christena Deem, MD;  Location: [facility] ENDOSCOPY;  Service: Endoscopy;  Laterality: N/A;   COLONOSCOPY WITH PROPOFOL N/A 12/03/2020   Procedure: COLONOSCOPY WITH PROPOFOL;  Surgeon: Regis Bill, MD;  Location: [facility] ENDOSCOPY;  Service: Endoscopy;  Laterality: N/A;   ESOPHAGOGASTRODUODENOSCOPY (EGD) WITH PROPOFOL N/A 08/07/2017   Procedure: ESOPHAGOGASTRODUODENOSCOPY (EGD) WITH PROPOFOL;  Surgeon: Christena Deem, MD;  Location: [facility] ENDOSCOPY;  Service: Endoscopy;  Laterality: N/A;   INSERTION OF MESH Right 01/16/2022   Procedure: INSERTION OF MESH;  Surgeon: Campbell Lerner, MD;   Location: [facility] ORS;  Service: General;  Laterality: Right;   lithopexy     PROSTATE BIOPSY  2012   malignant   REPLACEMENT TOTAL KNEE Bilateral    2009,2015   Family History  Problem Relation Age of Onset   Diabetes Mother    Colon cancer Mother    Colon cancer Father    Prostate cancer Neg Hx    Bladder Cancer Neg Hx    Kidney cancer Neg Hx    Social History   Socioeconomic History   Marital status: Married    Spouse name: Not on file   Number of children: 3   Years of education: Not on file   Highest education level: Master's degree (e.g., MA, MS, MEng, MEd, MSW, MBA)  Occupational History    Comment: works part time Catering manager  Tobacco Use   Smoking status: Former    Types: Cigarettes    Quit date: 1976    Years since quitting: 48.3   Smokeless tobacco: Never  Vaping Use   Vaping Use: Never used  Substance and Sexual Activity   Alcohol use: Yes    Alcohol/week: 14.0 standard drinks of alcohol    Types: 14 Shots of liquor per week    Comment: 2 scotch drinks per night   Drug use: No   Sexual activity: Not on file  Other Topics Concern   Not on file  Social History Narrative   Lives at home with wife.   Social Determinants of Health   Financial Resource Strain: Low Risk  (12/27/2022)   Overall Financial Resource Strain (CARDIA)    Difficulty of Paying Living Expenses: Not hard at all  Food Insecurity: No Food Insecurity (12/27/2022)   Hunger Vital Sign    Worried About Running Out of Food in the Last Year: Never true    Ran Out of Food in the Last Year: Never true  Transportation Needs: No Transportation Needs (12/27/2022)   PRAPARE - Administrator, Civil Service (Medical): No    Lack of Transportation (Non-Medical): No  Physical Activity: Inactive (12/27/2022)   Exercise Vital Sign    Days of Exercise per Week: 0 days    Minutes of Exercise per Session: 0 min  Stress: No Stress Concern Present (12/27/2022)   [employer] of Occupational  Health - Occupational Stress Questionnaire    Feeling of Stress : Not at all  Social Connections: Moderately Isolated (12/27/2022)   Social Connection and Isolation Panel [NHANES]    Frequency of Communication with Friends and Family: More than three times a week    Frequency of Social Gatherings with Friends and Family: More than three times a week    Attends Religious Services: Never    Database administrator or Organizations: No    Attends Banker Meetings: Never    Marital Status: Married    Tobacco Counseling Counseling given: Not Answered  Clinical Intake:  Pre-visit preparation completed: Yes  Pain : 0-10 Pain Score: 7  Pain Type: Chronic pain Pain Location: Back Pain Orientation: Lower     Diabetes: Yes CBG done?: No Did pt. bring in CBG monitor from home?: No  How often do you need to have someone help you when you read instructions, pamphlets, or other written materials from your doctor or pharmacy?: 1 - Never  Diabetic?yes Nutrition Risk Assessment:  Has the patient had any N/V/D within the last 2 months?  Yes  Does the patient have any non-healing wounds?  No  Has the patient had any unintentional weight loss or weight gain?  No   Diabetes:  Is the patient diabetic?  Yes  If diabetic, was a CBG obtained today?  No  Did the patient bring in their glucometer from home?  No  How often do you monitor your CBG's? occasionally.   Financial Strains and Diabetes Management:  Are you having any financial strains with the device, your supplies or your medication? No .  Does the patient want to be seen by Chronic Care Management for management of their diabetes?  No  Would the patient like to be referred to a Nutritionist or for Diabetic Management?  No   Diabetic Exams:  Diabetic Eye Exam: Completed due next month w/ Dr.Woodard. Pt has been advised about the importance in completing this exam.  Diabetic Foot Exam: Completed ordered on 12/20/22.  Pt has been advised about the importance in completing this exam.   Interpreter Needed?: No  Information entered by :: Kennedy Bucker, LPN   Activities of Daily Living    12/27/2022    1:33 PM 01/12/2022   11:39 AM  In your present state of health, do you have any difficulty performing the following activities:  Hearing? 0   Vision? 0   Difficulty concentrating or making decisions? 0   Walking or climbing stairs? 0   Dressing or bathing? 0   Doing errands, shopping? 0 0  Preparing Food and eating ? N   Using the Toilet? N   In the past six months, have you accidently leaked urine? N   Do you have problems with loss of bowel control? N   Managing your Medications? N   Managing your Finances? N   Housekeeping or managing your Housekeeping? N     Patient Care Team: Reubin Milan, MD as PCP - General (Internal Medicine) Isla Pence, OD (Optometry) Hoimes, Canary Brim, DO as Referring Physician (Hematology and Oncology) Dominica Severin, MD as Consulting Physician (Orthopedic Surgery) Regis Bill, MD as Consulting Physician (Gastroenterology)  Indicate any recent Medical Services you may have received from other than [facility] providers in the past year (date may be approximate).     Assessment:   This is a routine wellness examination for Sadiq.  Hearing/Vision screen Hearing Screening - Comments:: No aids Vision Screening - Comments:: Wears glasses- Dr.Woodard  Dietary issues and exercise activities discussed: Current Exercise Habits: The patient does not participate in regular exercise at present   Goals Addressed             This Visit's Progress    DIET - EAT MORE FRUITS AND VEGETABLES         Depression Screen    12/27/2022    1:31 PM 11/14/2022    4:08 PM 05/18/2022   10:56 AM 01/09/2022    9:49 AM 12/21/2021    1:39 PM 10/12/2021    9:34  AM 01/28/2021    1:33 PM  PHQ 2/9 Scores  PHQ - 2 Score 0 0 0 0 0 0 0  PHQ- 9 Score 0 0 2    Fall  Risk    12/27/2022    1:33 PM 11/14/2022    4:08 PM 05/18/2022   10:56 AM 02/02/2022    1:21 PM 01/09/2022    9:50 AM  Fall Risk   Falls in the past year? 1 1 0 0 0  Number falls in past yr: 0 1 0  1  Injury with Fall?  1 0  1  Risk for fall due to : History of fall(s);Impaired balance/gait History of fall(s);Impaired balance/gait No Fall Risks  History of fall(s)  Follow up Falls evaluation completed;Falls prevention discussed Falls evaluation completed Falls evaluation completed  Falls evaluation completed    FALL RISK PREVENTION PERTAINING TO THE HOME:  Any stairs in or around the home? No  If so, are there any without handrails? No  Home free of loose throw rugs in walkways, pet beds, electrical cords, etc? Yes  Adequate lighting in your home to reduce risk of falls? Yes   ASSISTIVE DEVICES UTILIZED TO PREVENT FALLS:  Life alert? No  Use of a cane, walker or w/c? No  Grab bars in the bathroom? Yes  Shower chair or bench in shower? No  Elevated toilet seat or a handicapped toilet? No    Cognitive Function:        12/27/2022    1:37 PM 11/11/2018    8:56 AM 08/23/2017   11:04 AM 08/07/2016    9:05 AM  6CIT Screen  What Year? 0 points 0 points 0 points 0 points  What month? 0 points 0 points 0 points 0 points  What time? 3 points 0 points 0 points 0 points  Count back from 20 0 points 0 points 0 points 0 points  Months in reverse 0 points 0 points 0 points 0 points  Repeat phrase 0 points 0 points 4 points 0 points  Total Score 3 points 0 points 4 points 0 points    Immunizations Immunization History  Administered Date(s) Administered   PFIZER(Purple Top)SARS-COV-2 Vaccination 11/24/2019, 12/17/2019    TDAP status: Due, Education has been provided regarding the importance of this vaccine. Advised may receive this vaccine at local pharmacy or Health Dept. Aware to provide a copy of the vaccination record if obtained from local pharmacy or Health Dept. Verbalized  acceptance and understanding.  Flu Vaccine status: Declined, Education has been provided regarding the importance of this vaccine but patient still declined. Advised may receive this vaccine at local pharmacy or Health Dept. Aware to provide a copy of the vaccination record if obtained from local pharmacy or Health Dept. Verbalized acceptance and understanding.  Pneumococcal vaccine status: Declined,  Education has been provided regarding the importance of this vaccine but patient still declined. Advised may receive this vaccine at local pharmacy or Health Dept. Aware to provide a copy of the vaccination record if obtained from local pharmacy or Health Dept. Verbalized acceptance and understanding.   Covid-19 vaccine status: Completed vaccines  Qualifies for Shingles Vaccine? Yes   Zostavax completed No   Shingrix Completed?: No.    Education has been provided regarding the importance of this vaccine. Patient has been advised to call insurance company to determine out of pocket expense if they have not yet received this vaccine. Advised may also receive vaccine at local pharmacy  or Health Dept. Verbalized acceptance and understanding.  Screening Tests Health Maintenance  Topic Date Due   DTaP/Tdap/Td (1 - Tdap) Never done   Zoster Vaccines- Shingrix (1 of 2) Never done   COVID-19 Vaccine (3 - Pfizer risk series) 01/14/2020   OPHTHALMOLOGY EXAM  12/25/2020   Pneumonia Vaccine 72+ Years old (1 of 2 - PCV) 05/19/2023 (Originally 11/22/1946)   INFLUENZA VACCINE  04/05/2023   HEMOGLOBIN A1C  04/13/2023   FOOT EXAM  05/19/2023   Diabetic kidney evaluation - Urine ACR  10/14/2023   Diabetic kidney evaluation - eGFR measurement  11/20/2023   COLONOSCOPY (Pts 45-50yrs Insurance coverage will need to be confirmed)  12/04/2023   Medicare Annual Wellness (AWV)  12/27/2023   HPV VACCINES  Aged Out    Health Maintenance  Health Maintenance Due  Topic Date Due   DTaP/Tdap/Td (1 - Tdap) Never done    Zoster Vaccines- Shingrix (1 of 2) Never done   COVID-19 Vaccine (3 - Pfizer risk series) 01/14/2020   OPHTHALMOLOGY EXAM  12/25/2020    Colorectal cancer screening: No longer required.   Lung Cancer Screening: (Low Dose CT Chest recommended if Age 16-80 years, 30 pack-year currently smoking OR have quit w/in 15years.) does not qualify.   Additional Screening:  Hepatitis C Screening: does not qualify; Completed no  Vision Screening: Recommended annual ophthalmology exams for early detection of glaucoma and other disorders of the eye. Is the patient up to date with their annual eye exam?  Yes  Who is the provider or what is the name of the office in which the patient attends annual eye exams? Dr.Woodard If pt is not established with a provider, would they like to be referred to a provider to establish care? No .   Dental Screening: Recommended annual dental exams for proper oral hygiene  Community Resource Referral / Chronic Care Management: CRR required this visit?  No   CCM required this visit?  No      Plan:     I have personally reviewed and noted the following in the patient's chart:   Medical and social history Use of alcohol, tobacco or illicit drugs  Current medications and supplements including opioid prescriptions. Patient is not currently taking opioid prescriptions. Functional ability and status Nutritional status Physical activity Advanced directives List of other physicians Hospitalizations, surgeries, and ER visits in previous 12 months Vitals Screenings to include cognitive, depression, and falls Referrals and appointments  In addition, I have reviewed and discussed with patient certain preventive protocols, quality metrics, and best practice recommendations. A written personalized care plan for preventive services as well as general preventive health recommendations were provided to patient.     Hal Hope, LPN   1/61/0960   Nurse Notes:  none

## 2023-01-02 ENCOUNTER — Ambulatory Visit (INDEPENDENT_AMBULATORY_CARE_PROVIDER_SITE_OTHER): Payer: Medicare Other | Admitting: Urology

## 2023-01-02 VITALS — BP 110/68 | HR 92

## 2023-01-02 DIAGNOSIS — Z8546 Personal history of malignant neoplasm of prostate: Secondary | ICD-10-CM

## 2023-01-02 DIAGNOSIS — R300 Dysuria: Secondary | ICD-10-CM

## 2023-01-02 DIAGNOSIS — R319 Hematuria, unspecified: Secondary | ICD-10-CM | POA: Diagnosis not present

## 2023-01-02 DIAGNOSIS — R350 Frequency of micturition: Secondary | ICD-10-CM

## 2023-01-02 LAB — URINALYSIS, COMPLETE
Bilirubin, UA: NEGATIVE
Glucose, UA: NEGATIVE
Ketones, UA: NEGATIVE
Nitrite, UA: NEGATIVE
Protein,UA: NEGATIVE
RBC, UA: NEGATIVE
Specific Gravity, UA: 1.02 (ref 1.005–1.030)
Urobilinogen, Ur: 0.2 mg/dL (ref 0.2–1.0)
pH, UA: 5.5 (ref 5.0–7.5)

## 2023-01-02 LAB — MICROSCOPIC EXAMINATION

## 2023-01-02 MED ORDER — GEMTESA 75 MG PO TABS: 1.0000 | ORAL_TABLET | Freq: Every day | ORAL | 11 refills | Status: DC

## 2023-01-02 MED ORDER — GEMTESA 75 MG PO TABS
1.0000 | ORAL_TABLET | Freq: Every day | ORAL | 11 refills | Status: DC
Start: 2023-01-02 — End: 2023-02-28

## 2023-01-02 MED ORDER — CEPHALEXIN 250 MG PO CAPS
500.0000 mg | ORAL_CAPSULE | Freq: Once | ORAL | Status: AC
Start: 2023-01-02 — End: 2023-01-02
  Administered 2023-01-02: 500 mg via ORAL

## 2023-01-02 NOTE — Progress Notes (Signed)
   01/02/23  CC:  Chief Complaint  Patient presents with   Cysto    HPI: 82 year old male with a personal history of prostate cancer managed at [facility] with persistent dysuria despite negative urinalysis.  He presents today for cystoscopy further further evaluation of this.  He is also had a trial of Gemtesa.  Today was his last dose.  He does feel like he has been able to get about 6 hours of sleep in before his urinary symptoms wake him up with his significantly better.  Blood pressure 110/68, pulse 92. [place]. A&Ox3.   No respiratory distress   Abd soft, NT, ND Normal phallus with bilateral descended testicles  Cystoscopy Procedure Note  Patient identification was confirmed, informed consent was obtained, and patient was prepped using Betadine solution.  Lidocaine jelly was administered per urethral meatus.     Pre-Procedure: - Inspection reveals a caliber urethra.  I was unable to advance the scope initially.  I used sterile forceps to slightly dilate the meatus until I was able to accommodate the scope.  Procedure: The flexible cystoscope was initially unable to be dye introduced until the meatus was dilated, see above -Very narrow irregular urethral sphincter/prostatic apex with changes consistent with radiation, able to push past this area with the beak of the scope - Enlarged prostate - Elevated bladder neck - Bilateral ureteral orifices identified - Bladder mucosa  reveals no ulcers, tumors, or lesions there was 1 area just beyond the trigone which had a very slightly texture eyes appearance but not on the base of erythema question inflammatory - No bladder stones - Moderate to severe trabeculation  Retroflexion shows intravesical protrusion   Post-Procedure: - Patient tolerated the procedure well  Assessment/ Plan:  1. Dysuria Etiology somewhat unclear, there is some slight subtle change in his bladder, will collect a urine cytology although suspicion for malignancy  within the bladder is unlikely  May be related to meatal stenosis versus radiation changes identified at the prostate  - Urinalysis, Complete - Cytology - non gyn; Future - Vibegron (GEMTESA) 75 MG TABS; Take 1 tablet (75 mg total) by mouth daily.  Dispense: 30 tablet; Refill: 11 - cephALEXin (KEFLEX) capsule 500 mg  2. Urinary frequency Improved with Gemtesa, will prescribe this medication  If not on formulary primitively expensive, will seek alternative  Very tight prostatic fossa with radiation changes, sequela of chronic obstruction also seen today which may be contributing to his symptoms.  Could consider channel TURP if he develops worsening obstructive urinary symptoms although risk of incontinence would be relatively high.  Will have him return to specifically discuss his ongoing urinary symptoms in the near future. - Cytology - non gyn; Future - Vibegron (GEMTESA) 75 MG TABS; Take 1 tablet (75 mg total) by mouth daily.  Dispense: 30 tablet; Refill: 11  3. History of prostate cancer Managed at [facility] [place] on ADT and Xtandi - Cytology - non gyn; Future - Vibegron (GEMTESA) 75 MG TABS; Take 1 tablet (75 mg total) by mouth daily.  Dispense: 30 tablet; Refill: 11  F/u to discuss urinary symptoms further  Vanna Scotland, MD

## 2023-01-03 ENCOUNTER — Telehealth: Payer: Medicare Other | Admitting: Orthopedic Surgery

## 2023-01-04 ENCOUNTER — Other Ambulatory Visit: Payer: Medicare Other

## 2023-01-04 DIAGNOSIS — R8289 Other abnormal findings on cytological and histological examination of urine: Secondary | ICD-10-CM | POA: Diagnosis not present

## 2023-01-04 DIAGNOSIS — Z8546 Personal history of malignant neoplasm of prostate: Secondary | ICD-10-CM

## 2023-01-04 DIAGNOSIS — R350 Frequency of micturition: Secondary | ICD-10-CM

## 2023-01-04 DIAGNOSIS — R300 Dysuria: Secondary | ICD-10-CM

## 2023-01-10 ENCOUNTER — Encounter: Payer: Self-pay | Admitting: Orthopedic Surgery

## 2023-01-10 ENCOUNTER — Ambulatory Visit (INDEPENDENT_AMBULATORY_CARE_PROVIDER_SITE_OTHER): Payer: Medicare Other | Admitting: Orthopedic Surgery

## 2023-01-10 DIAGNOSIS — M5136 Other intervertebral disc degeneration, lumbar region: Secondary | ICD-10-CM

## 2023-01-10 DIAGNOSIS — M47816 Spondylosis without myelopathy or radiculopathy, lumbar region: Secondary | ICD-10-CM

## 2023-01-10 DIAGNOSIS — S32030S Wedge compression fracture of third lumbar vertebra, sequela: Secondary | ICD-10-CM

## 2023-01-10 NOTE — Progress Notes (Signed)
   Telephone Visit- Progress Note: Referring Physician:  No referring provider defined for this encounter.  Primary Physician:  Reubin Milan, MD  This visit was performed via telephone.  Patient location: home Provider location: office  I spent a total of 10 minutes non-face-to-face activities for this visit on the date of this encounter including review of current clinical condition and response to treatment.    Patient has given verbal consent to this telephone visits and we reviewed the limitations of a telephone visit. Patient wishes to proceed.    Chief Complaint:  review lumbar xrays  History of Present Illness: Thomas Allen is a 82 y.o. male has a history of DM, hyperlipidemia, GERD, HTN, and prostate CA.    I have seen him in the past for a C5 fracture s/p fall on 06/12/22.   Phone visit to review his lumbar xrays.   He continues with more constant LBP that is worse in morning and worse with changing positions. No leg pain. No numbness, tingling, or weakness.   No known previous injury to his back.     Conservative measures:  Physical therapy: has not participated Multimodal medical therapy including regular antiinflammatories: none Injections: has not received epidural steroid injections   Past Surgery: no previous spine surgery   Thomas Allen has no symptoms of cervical myelopathy.    Exam: No exam done as this was a telephone encounter.     Imaging: Lumbar xrays dated 12/20/22:  FINDINGS: Diffuse lumbar degenerative disc disease with flowing anterior osteophytes and facet hypertrophy throughout the lumbar spine. Mild loss of height at the level of the superior endplate of L3 centrally with resultant roughly 20% maximal loss of central vertebral body height. No subluxation. Lateral flexion and extension views demonstrate no instability. No bony lesions or destruction. Diffuse calcified plaque in the abdominal aorta and iliac arteries.    IMPRESSION: Diffuse degenerative disc disease of the lumbar spine. Mild loss of height at the level of the superior endplate of L3 centrally with resultant roughly 20% maximal loss of central vertebral body height.     Electronically Signed   By: Irish Lack M.D.   On: 12/24/2022 11:50  I have personally reviewed the images and agree with the above interpretation.  Assessment and Plan: Thomas Allen is a pleasant 82 y.o. male with more constant LBP that is worse in morning and worse with changing positions. No leg pain. No numbness, tingling, or weakness.   He has known diffuse lumbar spondylosis and DDD with compression of L3 that is likely old.   Treatment options discussed with patient and following plan made:   - MRI of lumbar spine to evaluate possible compression at L3.  - Order for physical therapy for lumbar spine to [facility]. Patient to call to schedule appointment.  - Discussed injections. He's not sure he would want to do these.  - Will call to schedule f/u with me in office to review his MRI results once I have them back.   Drake Leach PA-C Neurosurgery

## 2023-01-11 ENCOUNTER — Telehealth: Payer: Medicare Other | Admitting: Orthopedic Surgery

## 2023-01-25 ENCOUNTER — Ambulatory Visit
Admission: RE | Admit: 2023-01-25 | Discharge: 2023-01-25 | Disposition: A | Discharge status: Home / Self Care | Payer: Medicare Other | Source: Ambulatory Visit | Attending: Orthopedic Surgery | Admitting: Orthopedic Surgery

## 2023-01-25 DIAGNOSIS — M5136 Other intervertebral disc degeneration, lumbar region: Secondary | ICD-10-CM | POA: Diagnosis not present

## 2023-01-25 DIAGNOSIS — M47816 Spondylosis without myelopathy or radiculopathy, lumbar region: Secondary | ICD-10-CM | POA: Diagnosis not present

## 2023-01-25 DIAGNOSIS — S32030S Wedge compression fracture of third lumbar vertebra, sequela: Secondary | ICD-10-CM | POA: Diagnosis not present

## 2023-01-25 DIAGNOSIS — M51369 Other intervertebral disc degeneration, lumbar region without mention of lumbar back pain or lower extremity pain: Secondary | ICD-10-CM

## 2023-02-05 DIAGNOSIS — M545 Low back pain, unspecified: Secondary | ICD-10-CM | POA: Diagnosis not present

## 2023-02-06 ENCOUNTER — Ambulatory Visit: Payer: Medicare Other | Admitting: Urology

## 2023-02-09 NOTE — Progress Notes (Unsigned)
Referring Physician:  Reubin Milan, MD [address] [place],  [place] [address]  Primary Physician:  Reubin Milan, MD  History of Present Illness: 02/09/2023 [name] has a history of DM, hyperlipidemia, GERD, HTN, and prostate CA.   I have seen him in the past for a C5 fracture s/p fall on 06/12/22.   Did phone visit with him on 01/10/23 and he continued with more constant LBP that is worse in morning and worse with changing positions. No leg pain. No numbness, tingling, or weakness.    He has known diffuse lumbar spondylosis and DDD with compression of L3 that is likely old.   MRI of lumbar spine ordered and he is here to review it.     ***signs of chronic bladder outlet obstruction?***        1 month history of constant LBP that is more central. No leg pain. Pain is worse with bending (putting on socks). No alleviating factors.   He has not tried any medications.   He is being treated for frequent urination and diarrhea- this has been going on for months.   Conservative measures:  Physical therapy: has not participated Multimodal medical therapy including regular antiinflammatories: none Injections: has not received epidural steroid injections  Past Surgery: no previous spine surgery  Marcellous N Maugeri has no symptoms of cervical myelopathy.  The symptoms are causing a significant impact on the patient's life.   Review of Systems:  A 10 point review of systems is negative, except for the pertinent positives and negatives detailed in the HPI.  Past Medical History: Past Medical History:  Diagnosis Date   Arthritis    Cancer (HCC)    Diabetes mellitus without complication (HCC)    GERD (gastroesophageal reflux disease)    Haglund's deformity    History of kidney stones    Hyperlipidemia    Hypertension    Intractable hiccups 02/05/2017   Prostate CA (HCC)    Sleep apnea     Past Surgical History: Past Surgical History:  Procedure  Laterality Date   APPENDECTOMY     COLONOSCOPY  2008   benign polyps   COLONOSCOPY WITH PROPOFOL N/A 08/07/2017   Procedure: COLONOSCOPY WITH PROPOFOL;  Surgeon: Christena Deem, MD;  Location: [facility] ENDOSCOPY;  Service: Endoscopy;  Laterality: N/A;   COLONOSCOPY WITH PROPOFOL N/A 12/03/2020   Procedure: COLONOSCOPY WITH PROPOFOL;  Surgeon: Regis Bill, MD;  Location: [facility] ENDOSCOPY;  Service: Endoscopy;  Laterality: N/A;   ESOPHAGOGASTRODUODENOSCOPY (EGD) WITH PROPOFOL N/A 08/07/2017   Procedure: ESOPHAGOGASTRODUODENOSCOPY (EGD) WITH PROPOFOL;  Surgeon: Christena Deem, MD;  Location: [facility] ENDOSCOPY;  Service: Endoscopy;  Laterality: N/A;   INSERTION OF MESH Right 01/16/2022   Procedure: INSERTION OF MESH;  Surgeon: Campbell Lerner, MD;  Location: [facility] ORS;  Service: General;  Laterality: Right;   lithopexy     PROSTATE BIOPSY  2012   malignant   REPLACEMENT TOTAL KNEE Bilateral    [id number]    Allergies: Allergies as of 02/12/2023 - Review Complete 01/10/2023  Allergen Reaction Noted   Ace inhibitors Cough 01/19/2016    Medications: Outpatient Encounter Medications as of 02/12/2023  Medication Sig   blood glucose meter kit and supplies KIT Dispense based on patient and insurance preference. Use up to four times daily as directed.   calcium carbonate (OS-CAL - DOSED IN MG OF ELEMENTAL CALCIUM) 1250 (500 Ca) MG tablet Take 1 tablet by mouth.   enzalutamide (XTANDI) 40 MG  tablet Take 40 mg by mouth in the morning.   lidocaine (LIDODERM) 5 % Place 1 patch onto the skin every 12 (twelve) hours. Remove & Discard patch within 12 hours or as directed by MD   loperamide (IMODIUM A-D) 2 MG tablet Take 2 mg by mouth 4 (four) times daily as needed for diarrhea or loose stools.   metFORMIN (GLUCOPHAGE) 1000 MG tablet Take 1 tablet (1,000 mg total) by mouth daily with breakfast.   omeprazole (PRILOSEC) 20 MG capsule Take 1 capsule (20 mg total) by mouth daily.   Vibegron (GEMTESA)  75 MG TABS Take 1 tablet (75 mg total) by mouth daily.   No facility-administered encounter medications on file as of 02/12/2023.    Social History: Social History   Tobacco Use   Smoking status: Former    Types: Cigarettes    Quit date: 1976    Years since quitting: 48.4   Smokeless tobacco: Never  Vaping Use   Vaping Use: Never used  Substance Use Topics   Alcohol use: Yes    Alcohol/week: 14.0 standard drinks of alcohol    Types: 14 Shots of liquor per week    Comment: 2 scotch drinks per night   Drug use: No    Family Medical History: Family History  Problem Relation Age of Onset   Diabetes Mother    Colon cancer Mother    Colon cancer Father    Prostate cancer Neg Hx    Bladder Cancer Neg Hx    Kidney cancer Neg Hx     Physical Examination: There were no vitals filed for this visit.     Awake, alert, oriented to person, place, and time.  Speech is clear and fluent. Fund of knowledge is appropriate.   Cranial Nerves: Pupils equal round and reactive to light.  Facial tone is symmetric.    He has mild central lower lumbar tenderness.   He has pain with lumbar flexion and extension with limited ROM.   No abnormal lesions on exposed skin.   Strength: Side Biceps Triceps Deltoid Interossei Grip Wrist Ext. Wrist Flex.  R 5 5 5 5 5 5 5   L 5 5 5 5 5 5 5    Side Iliopsoas Quads Hamstring PF DF EHL  R 5 5 5 5 5 5   L 5 5 5 5 5 5    Reflexes are 2+ and symmetric at the biceps, triceps, brachioradialis, patella and achilles.   Hoffman's is absent.  Clonus is not present.   Bilateral upper and lower extremity sensation is intact to light touch.     Gait is normal.    Medical Decision Making  Imaging: MRI of lumbar spine dated 01/25/23:  FINDINGS: Segmentation:  Standard.   Alignment:  Mild levocurvature   Vertebrae: Marrow edema involving the anterior and leftward aspect of the L3 body. There is an old superior endplate fracture at this level without  associated edema. No aggressive bone lesion.   Conus medullaris and cauda equina: Conus extends to the L1 level. Conus and cauda equina appear normal.   Paraspinal and other soft tissues: Distended urinary bladder with trabeculation and cellules/diverticula. Numerous colonic diverticula. No perispinal mass or inflammation   Disc levels:   T12- L1: Ventral spondylitic spurring   L1-L2: Ventral spondylitic spurring.   L2-L3: Disc height loss and bulging with central protrusion that show signs of ossification. Ventral spondylitic spurring which is bulky. Mild facet spurring   L3-L4: Disc collapse with endplate ridging and degenerative  facet spurring eccentric to the right where there is mild stenosis. Mild right subarticular recess narrowing   L4-L5: Disc collapse with endplate and facet spurring eccentric to the right. Right subarticular recess stenosis and L5 impingement. Patent foramina   L5-S1:Degenerative facet spurring which is bulky on the right. Annular fissures with disc height loss and bulging.   IMPRESSION: 1. Remote L3 superior endplate fracture. There is active marrow edema at the anterior aspect of the L3 body which could be degenerative or from an osteophyte fracture. 2. Generalized lumbar spine degeneration. Right subarticular recess narrowing at L4-5 that could affect the right L5 nerve root. Diffusely patent foramina. 3. Signs of chronic bladder outlet obstruction.     Electronically Signed   By: Tiburcio Pea M.D.   On: 02/03/2023 10:47  Assessment and Plan: Mr. Rosamond is a pleasant 82 y.o. male with history of C5 fracture s/p fall on 06/12/22.   1 month history of constant LBP that is more central. No leg pain. Pain is worse with bending (putting on socks).   No lumbar imaging. Pain appears lumbar mediated.   Treatment options discussed with patient and following plan made:   - Lumbar xrays ordered. He will go to [facility].  -  He will try OTC lidoderm patches.  - Hold on NSAIDs for now as he is in workup of chronic diarrhea.  - Will call him with xray results and likely will consider PT.   He has left foot pain and thinks he may have glass in his foot. Referral to podiatry.   I spent a total of 20 minutes in face-to-face and non-face-to-face activities related to this patient's care today including review of outside records, review of imaging, review of symptoms, physical exam, discussion of differential diagnosis, discussion of treatment options, and documentation.   Drake Leach PA-C Dept. of Neurosurgery

## 2023-02-12 ENCOUNTER — Encounter: Payer: Self-pay | Admitting: Orthopedic Surgery

## 2023-02-12 ENCOUNTER — Ambulatory Visit (INDEPENDENT_AMBULATORY_CARE_PROVIDER_SITE_OTHER): Payer: Medicare Other | Admitting: Orthopedic Surgery

## 2023-02-12 VITALS — BP 110/64 | Ht 68.0 in | Wt 196.4 lb

## 2023-02-12 DIAGNOSIS — M47816 Spondylosis without myelopathy or radiculopathy, lumbar region: Secondary | ICD-10-CM

## 2023-02-12 DIAGNOSIS — M5136 Other intervertebral disc degeneration, lumbar region: Secondary | ICD-10-CM

## 2023-02-12 NOTE — Patient Instructions (Signed)
It was so nice to see you today. Thank you so much for coming in.    Your lumbar MRI shows wear and tear (arthritis) and this is likely what his causing your pain.   I agree with continuing with physical therapy for your back.   If your pain gets worse or more frequent, we can consider injections in your back. Call me if this happens before your follow up.   I will see you back in 8-10 weeks. Please do not hesitate to call if you have any questions or concerns. You can also message me in MyChart.   Drake Leach PA-C [phone]

## 2023-02-15 DIAGNOSIS — M545 Low back pain, unspecified: Secondary | ICD-10-CM | POA: Diagnosis not present

## 2023-02-22 DIAGNOSIS — M545 Low back pain, unspecified: Secondary | ICD-10-CM | POA: Diagnosis not present

## 2023-02-28 NOTE — Assessment & Plan Note (Deleted)
Stable exam with well controlled BP on lifestyle changes. Will continue to recommend low sodium diet and current regimen.

## 2023-02-28 NOTE — Progress Notes (Unsigned)
Date:  03/01/2023   Name:  Thomas Allen   DOB:  09-16-1940   MRN:  [id number]   Chief Complaint: No chief complaint on file. Thomas Allen is a 82 y.o. male who presents today for follow up.   Colonoscopy: 12/2020  Immunization History  Administered Date(s) Administered   PFIZER(Purple Top)SARS-COV-2 Vaccination 11/24/2019, 12/17/2019   Health Maintenance Due  Topic Date Due   DTaP/Tdap/Td (1 - Tdap) Never done   Zoster Vaccines- Shingrix (1 of 2) Never done   COVID-19 Vaccine (3 - Pfizer risk series) 01/14/2020   OPHTHALMOLOGY EXAM  12/25/2020    Lab Results  Component Value Date   PSA1 <0.1 10/12/2021   PSA1 1.1 09/18/2018   PSA1 0.6 08/23/2017    HPI  Lab Results  Component Value Date   NA 138 11/20/2022   K 4.3 11/20/2022   CO2 23 11/20/2022   GLUCOSE 133 (H) 11/20/2022   BUN 20 11/20/2022   CREATININE 0.91 11/20/2022   CALCIUM 9.8 11/20/2022   EGFR 85 11/20/2022   GFRNONAA >60 06/12/2022   Lab Results  Component Value Date   CHOL 166 10/12/2021   HDL 45 10/12/2021   LDLCALC 91 10/12/2021   TRIG 175 (H) 10/12/2021   CHOLHDL 3.7 10/12/2021   Lab Results  Component Value Date   TSH 1.620 11/20/2022   Lab Results  Component Value Date   HGBA1C 5.8 (A) 10/13/2022   Lab Results  Component Value Date   WBC 7.4 11/20/2022   HGB 12.9 (L) 11/20/2022   HCT 39.1 11/20/2022   MCV 97 11/20/2022   PLT 227 11/20/2022   Lab Results  Component Value Date   ALT 16 11/20/2022   AST 13 11/20/2022   ALKPHOS 88 11/20/2022   BILITOT 0.3 11/20/2022   No results found for: "25OHVITD2", "25OHVITD3", "VD25OH"   Review of Systems  Constitutional:  Negative for appetite change, chills, diaphoresis, fatigue and unexpected weight change.  HENT:  Negative for hearing loss, tinnitus, trouble swallowing and voice change.   Eyes:  Negative for visual disturbance.  Respiratory:  Negative for choking, shortness of breath and wheezing.   Cardiovascular:  Negative for  chest pain, palpitations and leg swelling.  Gastrointestinal:  Negative for abdominal pain, blood in stool, constipation and diarrhea.  Genitourinary:  Negative for difficulty urinating, dysuria and frequency.  Musculoskeletal:  Negative for arthralgias, back pain and myalgias.  Skin:  Negative for color change and rash.  Neurological:  Negative for dizziness, syncope and headaches.  Hematological:  Negative for adenopathy.  Psychiatric/Behavioral:  Negative for dysphoric mood and sleep disturbance. The patient is not nervous/anxious.     Patient Active Problem List   Diagnosis Date Noted   Diarrhea 11/14/2022   Status post laparoscopic hernia repair 02/02/2022   Low back strain, initial encounter 01/28/2021   Hand pain, right 08/23/2017   Erectile dysfunction following radiation therapy 06/26/2017   Urinary frequency 06/18/2017   Hyperlipidemia associated with type 2 diabetes mellitus (HCC) 03/12/2017   Asymptomatic PVCs 02/05/2017   Gastroesophageal reflux disease 02/05/2017   Alcohol use disorder 01/19/2016   Type II diabetes mellitus with complication (HCC) 07/26/2015   Carpal tunnel syndrome 03/30/2015   Essential hypertension 03/30/2015   Abnormal LFTs 03/30/2015   H/O adenomatous polyp of colon 03/30/2015   Tobacco use disorder, moderate, in sustained remission 03/30/2015   Prostate cancer (HCC) 09/02/2013   Posterior calcaneal exostosis 02/26/2013   Calcific Achilles tendinitis 02/26/2013  Allergies  Allergen Reactions   Ace Inhibitors Cough    Past Surgical History:  Procedure Laterality Date   APPENDECTOMY     COLONOSCOPY  2008   benign polyps   COLONOSCOPY WITH PROPOFOL N/A 08/07/2017   Procedure: COLONOSCOPY WITH PROPOFOL;  Surgeon: Christena Deem, MD;  Location: [facility] ENDOSCOPY;  Service: Endoscopy;  Laterality: N/A;   COLONOSCOPY WITH PROPOFOL N/A 12/03/2020   Procedure: COLONOSCOPY WITH PROPOFOL;  Surgeon: Regis Bill, MD;  Location: [facility]  ENDOSCOPY;  Service: Endoscopy;  Laterality: N/A;   ESOPHAGOGASTRODUODENOSCOPY (EGD) WITH PROPOFOL N/A 08/07/2017   Procedure: ESOPHAGOGASTRODUODENOSCOPY (EGD) WITH PROPOFOL;  Surgeon: Christena Deem, MD;  Location[facility] ENDOSCOPY;  Service: Endoscopy;  Laterality: N/A;   INSERTION OF MESH Right 01/16/2022   Procedure: INSERTION OF MESH;  Surgeon: Campbell Lerner, MD;  Location: [facility] ORS;  Service: General;  Laterality: Right;   lithopexy     PROSTATE BIOPSY  2012   malignant   REPLACEMENT TOTAL KNEE Bilateral    2009,2015    Social History   Tobacco Use   Smoking status: Former    Types: Cigarettes    Quit date: 1976    Years since quitting: 48.5   Smokeless tobacco: Never  Vaping Use   Vaping Use: Never used  Substance Use Topics   Alcohol use: Yes    Alcohol/week: 14.0 standard drinks of alcohol    Types: 14 Shots of liquor per week    Comment: 2 scotch drinks per night   Drug use: No     Medication list has been reviewed and updated.  No outpatient medications have been marked as taking for the 03/01/23 encounter (Appointment) with Reubin Milan, MD.       11/14/2022    4:08 PM 05/18/2022   10:56 AM 01/09/2022    9:49 AM 10/12/2021    9:34 AM  GAD 7 : Generalized Anxiety Score  Nervous, Anxious, on Edge 0 0 0 0  Control/stop worrying 1 0 0 0  Worry too much - different things 1 0 0 0  Trouble relaxing 0 0 0 0  Restless 0 0 0 0  Easily annoyed or irritable 0 0 0 0  Afraid - awful might happen 0 0 0 0  Total GAD 7 Score 2 0 0 0  Anxiety Difficulty Not difficult at all Not difficult at all Not difficult at all        12/27/2022    1:31 PM 11/14/2022    4:08 PM 05/18/2022   10:56 AM  Depression screen PHQ 2/9  Decreased Interest 0 0 0  Down, Depressed, Hopeless 0 0 0  PHQ - 2 Score 0 0 0  Altered sleeping 0 3 1  Tired, decreased energy 0 3 0  Change in appetite 0 3 0  Feeling bad or failure about yourself  0 0 0  Trouble concentrating 0 0 0  Moving  slowly or fidgety/restless 0 0 0  Suicidal thoughts 0 0 0  PHQ-9 Score 0 9 1  Difficult doing work/chores Not difficult at all Not difficult at all Not difficult at all    BP Readings from Last 3 Encounters:  02/12/23 110/64  01/02/23 110/68  12/20/22 110/84    Physical Exam Vitals and nursing note reviewed.  Constitutional:      Appearance: Normal appearance. He is well-developed.  HENT:     Head: Normocephalic.     Right Ear: Tympanic membrane, ear canal and external ear normal.  Left Ear: Tympanic membrane, ear canal and external ear normal.     Nose: Nose normal.  Eyes:     Conjunctiva/sclera: Conjunctivae normal.     Pupils: Pupils are equal, round, and reactive to light.  Neck:     Thyroid: No thyromegaly.     Vascular: No carotid bruit.  Cardiovascular:     Rate and Rhythm: Normal rate and regular rhythm.     Heart sounds: Normal heart sounds.  Pulmonary:     Effort: Pulmonary effort is normal.     Breath sounds: Normal breath sounds. No wheezing.  Chest:  Breasts:    Right: No mass.     Left: No mass.  Abdominal:     General: Bowel sounds are normal.     Palpations: Abdomen is soft.     Tenderness: There is no abdominal tenderness.  Musculoskeletal:        General: Normal range of motion.     Cervical back: Normal range of motion and neck supple.  Lymphadenopathy:     Cervical: No cervical adenopathy.  Skin:    General: Skin is warm and dry.  Neurological:     General: No focal deficit present.     Mental Status: He is alert and oriented to person, place, and time.     Deep Tendon Reflexes: Reflexes are normal and symmetric.  Psychiatric:        Attention and Perception: Attention normal.        Mood and Affect: Mood normal.        Thought Content: Thought content normal.     Wt Readings from Last 3 Encounters:  02/12/23 196 lb 6.4 oz (89.1 kg)  12/27/22 194 lb (88 kg)  12/20/22 194 lb 3.2 oz (88.1 kg)    There were no vitals taken for this  visit.  Assessment and Plan:  Problem List Items Addressed This Visit     Type II diabetes mellitus with complication (HCC) (Chronic)    Blood sugars stable without hypoglycemic symptoms or events. Currently being treated with metformin. Lab Results  Component Value Date   HGBA1C 5.8 (A) 10/13/2022        Hyperlipidemia associated with type 2 diabetes mellitus (HCC) (Chronic)    Lipids treated with lifestyle changes only. LDL is  Lab Results  Component Value Date   LDLCALC 91 10/12/2021        Essential hypertension - Primary    Stable exam with well controlled BP on lifestyle changes. Will continue to recommend low sodium diet and current regimen.       Other Visit Diagnoses     Long term current use of oral hypoglycemic drug           No follow-ups on file.   Partially dictated using Dragon software, any errors are not intentional.  Reubin Milan, MD [facility] [place], [place]

## 2023-02-28 NOTE — Assessment & Plan Note (Deleted)
Blood sugars stable without hypoglycemic symptoms or events. Currently being treated with metformin. Lab Results  Component Value Date   HGBA1C 5.8 (A) 10/13/2022

## 2023-02-28 NOTE — Assessment & Plan Note (Signed)
Lipids treated with lifestyle changes only. LDL is  Lab Results  Component Value Date   LDLCALC 91 10/12/2021

## 2023-03-01 ENCOUNTER — Encounter: Payer: Medicare Other | Admitting: Internal Medicine

## 2023-03-01 DIAGNOSIS — E1169 Type 2 diabetes mellitus with other specified complication: Secondary | ICD-10-CM

## 2023-03-01 DIAGNOSIS — M545 Low back pain, unspecified: Secondary | ICD-10-CM | POA: Diagnosis not present

## 2023-03-01 DIAGNOSIS — I10 Essential (primary) hypertension: Secondary | ICD-10-CM

## 2023-03-01 DIAGNOSIS — E118 Type 2 diabetes mellitus with unspecified complications: Secondary | ICD-10-CM

## 2023-03-01 DIAGNOSIS — Z7984 Long term (current) use of oral hypoglycemic drugs: Secondary | ICD-10-CM

## 2023-03-14 DIAGNOSIS — M545 Low back pain, unspecified: Secondary | ICD-10-CM | POA: Diagnosis not present

## 2023-03-21 DIAGNOSIS — M545 Low back pain, unspecified: Secondary | ICD-10-CM | POA: Diagnosis not present

## 2023-04-04 DIAGNOSIS — M545 Low back pain, unspecified: Secondary | ICD-10-CM | POA: Diagnosis not present

## 2023-04-09 ENCOUNTER — Ambulatory Visit: Payer: Self-pay | Admitting: *Deleted

## 2023-04-09 NOTE — Telephone Encounter (Signed)
  Chief Complaint: pain with urination Symptoms: pain, frequency/urgency, small amounts Frequency: ongoing urinary problems- worse today Pertinent Negatives: Patient denies fever, blood in urine Disposition: [] ED /[] Urgent Care (no appt availability in office) / [x] Appointment(In office/virtual)/ []  [place] [facility]/ [] Home Care/ [] Refused Recommended Disposition /[] [place] [facility]/ []  Follow-up with PCP Additional Notes: Patient has been scheduled and put on cancellation list in case a sooner appointment opens

## 2023-04-09 NOTE — Telephone Encounter (Signed)
Reason for Disposition  All other males with painful urination  Answer Assessment - Initial Assessment Questions 1. SYMPTOM: "What's the main symptom you're concerned about?" (e.g., frequency, incontinence)     Frequency, pain with urination, decreased output 2. ONSET: "When did the  symptoms  start?"     today 3. PAIN: "Is there any pain?" If Yes, ask: "How bad is it?" (Scale: 1-10; mild, moderate, severe)     Yes- moderate 4. CAUSE: "What do you think is causing the symptoms?"     UTI 5. OTHER SYMPTOMS: "Do you have any other symptoms?" (e.g., blood in urine, fever, flank pain, pain with urination)     pain  Protocols used: Urinary Symptoms-A-AH, Urination Pain - Male-A-AH

## 2023-04-10 ENCOUNTER — Ambulatory Visit (INDEPENDENT_AMBULATORY_CARE_PROVIDER_SITE_OTHER): Payer: Medicare Other | Admitting: Internal Medicine

## 2023-04-10 ENCOUNTER — Encounter: Payer: Self-pay | Admitting: Internal Medicine

## 2023-04-10 VITALS — BP 128/64 | HR 98 | Ht 68.0 in | Wt 193.0 lb

## 2023-04-10 DIAGNOSIS — M65351 Trigger finger, right little finger: Secondary | ICD-10-CM | POA: Diagnosis not present

## 2023-04-10 DIAGNOSIS — N3001 Acute cystitis with hematuria: Secondary | ICD-10-CM

## 2023-04-10 DIAGNOSIS — R300 Dysuria: Secondary | ICD-10-CM

## 2023-04-10 LAB — POCT URINALYSIS DIPSTICK
Bilirubin, UA: NEGATIVE
Glucose, UA: NEGATIVE
Ketones, UA: NEGATIVE
Nitrite, UA: POSITIVE
Protein, UA: NEGATIVE
Spec Grav, UA: 1.015 (ref 1.010–1.025)
Urobilinogen, UA: 0.2 E.U./dL
pH, UA: 6 (ref 5.0–8.0)

## 2023-04-10 MED ORDER — SULFAMETHOXAZOLE-TRIMETHOPRIM 800-160 MG PO TABS
1.0000 | ORAL_TABLET | Freq: Two times a day (BID) | ORAL | 0 refills | Status: AC
Start: 2023-04-10 — End: 2023-04-20

## 2023-04-10 NOTE — Assessment & Plan Note (Signed)
He has developed triggering of two fingers.  It is mild at this time. He has a PT at [facility] who says they can work on his hands to help avoid surgery so he would like a referral.

## 2023-04-10 NOTE — Progress Notes (Signed)
Date:  04/10/2023   Name:  Thomas Allen   DOB:  05-29-1941   MRN:  [id number]   Chief Complaint: Urinary Frequency (Urinating every 2-10 minutes for the last two days. Painful and getting very little out. )  Urinary Frequency  This is a new problem. The current episode started in the past 7 days. The problem occurs every urination. The problem has been unchanged. The quality of the pain is described as burning. The pain is moderate. There has been no fever. Associated symptoms include frequency and urgency. Pertinent negatives include no chills.  Hand Pain  There was no injury mechanism. The pain is present in the left hand and right hand. The quality of the pain is described as cramping. The pain does not radiate. The pain is mild. The pain has been Fluctuating since the incident. Associated symptoms comments: triggering.    Lab Results  Component Value Date   NA 138 11/20/2022   K 4.3 11/20/2022   CO2 23 11/20/2022   GLUCOSE 133 (H) 11/20/2022   BUN 20 11/20/2022   CREATININE 0.91 11/20/2022   CALCIUM 9.8 11/20/2022   EGFR 85 11/20/2022   GFRNONAA >60 06/12/2022   Lab Results  Component Value Date   CHOL 166 10/12/2021   HDL 45 10/12/2021   LDLCALC 91 10/12/2021   TRIG 175 (H) 10/12/2021   CHOLHDL 3.7 10/12/2021   Lab Results  Component Value Date   TSH 1.620 11/20/2022   Lab Results  Component Value Date   HGBA1C 5.8 (A) 10/13/2022   Lab Results  Component Value Date   WBC 7.4 11/20/2022   HGB 12.9 (L) 11/20/2022   HCT 39.1 11/20/2022   MCV 97 11/20/2022   PLT 227 11/20/2022   Lab Results  Component Value Date   ALT 16 11/20/2022   AST 13 11/20/2022   ALKPHOS 88 11/20/2022   BILITOT 0.3 11/20/2022   No results found for: "25OHVITD2", "25OHVITD3", "VD25OH"   Review of Systems  Constitutional:  Negative for chills, fatigue and fever.  Respiratory:  Negative for chest tightness and shortness of breath.   Gastrointestinal:  Negative for constipation and  diarrhea.  Genitourinary:  Positive for dysuria, frequency and urgency.  Musculoskeletal:  Positive for arthralgias, gait problem and neck pain.  Neurological:  Negative for dizziness and headaches.  Psychiatric/Behavioral:  Positive for sleep disturbance.     Patient Active Problem List   Diagnosis Date Noted   Trigger little finger of right hand 04/10/2023   Diarrhea 11/14/2022   Status post laparoscopic hernia repair 02/02/2022   Low back strain, initial encounter 01/28/2021   Hand pain, right 08/23/2017   Erectile dysfunction following radiation therapy 06/26/2017   Urinary frequency 06/18/2017   Hyperlipidemia associated with type 2 diabetes mellitus (HCC) 03/12/2017   Asymptomatic PVCs 02/05/2017   Gastroesophageal reflux disease 02/05/2017   Alcohol use disorder 01/19/2016   Type II diabetes mellitus with complication (HCC) 07/26/2015   Carpal tunnel syndrome 03/30/2015   Essential hypertension 03/30/2015   Abnormal LFTs 03/30/2015   H/O adenomatous polyp of colon 03/30/2015   Tobacco use disorder, moderate, in sustained remission 03/30/2015   Prostate cancer (HCC) 09/02/2013   Posterior calcaneal exostosis 02/26/2013   Calcific Achilles tendinitis 02/26/2013    Allergies  Allergen Reactions   Ace Inhibitors Cough    Past Surgical History:  Procedure Laterality Date   APPENDECTOMY     COLONOSCOPY  2008   benign polyps   COLONOSCOPY WITH PROPOFOL  N/A 08/07/2017   Procedure: COLONOSCOPY WITH PROPOFOL;  Surgeon: Christena Deem, MD;  Location: [facility] ENDOSCOPY;  Service: Endoscopy;  Laterality: N/A;   COLONOSCOPY WITH PROPOFOL N/A 12/03/2020   Procedure: COLONOSCOPY WITH PROPOFOL;  Surgeon: Regis Bill, MD;  Location: [facility] ENDOSCOPY;  Service: Endoscopy;  Laterality: N/A;   ESOPHAGOGASTRODUODENOSCOPY (EGD) WITH PROPOFOL N/A 08/07/2017   Procedure: ESOPHAGOGASTRODUODENOSCOPY (EGD) WITH PROPOFOL;  Surgeon: Christena Deem, MD;  Location: [facility] ENDOSCOPY;   Service: Endoscopy;  Laterality: N/A;   INSERTION OF MESH Right 01/16/2022   Procedure: INSERTION OF MESH;  Surgeon: Campbell Lerner, MD;  Location: [facility] ORS;  Service: General;  Laterality: Right;   lithopexy     PROSTATE BIOPSY  2012   malignant   REPLACEMENT TOTAL KNEE Bilateral    2009,2015    Social History   Tobacco Use   Smoking status: Former    Current packs/day: 0.00    Types: Cigarettes    Quit date: 1976    Years since quitting: 48.6   Smokeless tobacco: Never  Vaping Use   Vaping status: Never Used  Substance Use Topics   Alcohol use: Yes    Alcohol/week: 14.0 standard drinks of alcohol    Types: 14 Shots of liquor per week    Comment: 2 scotch drinks per night   Drug use: No     Medication list has been reviewed and updated.  Current Meds  Medication Sig   calcium carbonate (OS-CAL - DOSED IN MG OF ELEMENTAL CALCIUM) 1250 (500 Ca) MG tablet Take 1 tablet by mouth.   enzalutamide (XTANDI) 40 MG tablet Take 40 mg by mouth in the morning.   lidocaine (LIDODERM) 5 % Place 1 patch onto the skin every 12 (twelve) hours. Remove & Discard patch within 12 hours or as directed by MD   loperamide (IMODIUM A-D) 2 MG tablet Take 2 mg by mouth 4 (four) times daily as needed for diarrhea or loose stools.   metFORMIN (GLUCOPHAGE) 1000 MG tablet Take 1 tablet (1,000 mg total) by mouth daily with breakfast.   omeprazole (PRILOSEC) 20 MG capsule Take 1 capsule (20 mg total) by mouth daily.   sulfamethoxazole-trimethoprim (BACTRIM DS) 800-160 MG tablet Take 1 tablet by mouth 2 (two) times daily for 10 days.       11/14/2022    4:08 PM 05/18/2022   10:56 AM 01/09/2022    9:49 AM 10/12/2021    9:34 AM  GAD 7 : Generalized Anxiety Score  Nervous, Anxious, on Edge 0 0 0 0  Control/stop worrying 1 0 0 0  Worry too much - different things 1 0 0 0  Trouble relaxing 0 0 0 0  Restless 0 0 0 0  Easily annoyed or irritable 0 0 0 0  Afraid - awful might happen 0 0 0 0  Total GAD 7  Score 2 0 0 0  Anxiety Difficulty Not difficult at all Not difficult at all Not difficult at all        12/27/2022    1:31 PM 11/14/2022    4:08 PM 05/18/2022   10:56 AM  Depression screen PHQ 2/9  Decreased Interest 0 0 0  Down, Depressed, Hopeless 0 0 0  PHQ - 2 Score 0 0 0  Altered sleeping 0 3 1  Tired, decreased energy 0 3 0  Change in appetite 0 3 0  Feeling bad or failure about yourself  0 0 0  Trouble concentrating 0 0 0  Moving  slowly or fidgety/restless 0 0 0  Suicidal thoughts 0 0 0  PHQ-9 Score 0 9 1  Difficult doing work/chores Not difficult at all Not difficult at all Not difficult at all    BP Readings from Last 3 Encounters:  04/10/23 128/64  02/12/23 110/64  01/02/23 110/68    Physical Exam Vitals and nursing note reviewed.  Constitutional:      General: He is not in acute distress.    Appearance: Normal appearance. He is well-developed.  HENT:     Head: Normocephalic and atraumatic.  Cardiovascular:     Rate and Rhythm: Normal rate.  Pulmonary:     Effort: Pulmonary effort is normal. No respiratory distress.     Breath sounds: No wheezing or rhonchi.  Abdominal:     Tenderness: There is no abdominal tenderness. There is no right CVA tenderness or left CVA tenderness.  Musculoskeletal:        General: Normal range of motion.     Comments: Mild triggering right 5th finger and left middle finger  Skin:    General: Skin is warm and dry.     Findings: No rash.  Neurological:     Mental Status: He is alert and oriented to person, place, and time.  Psychiatric:        Mood and Affect: Mood normal.        Behavior: Behavior normal.   Urine dipstick shows positive for WBC's, positive for RBC's, positive for nitrates, and positive for leukocytes.  Micro exam: not done.   Wt Readings from Last 3 Encounters:  04/10/23 193 lb (87.5 kg)  02/12/23 196 lb 6.4 oz (89.1 kg)  12/27/22 194 lb (88 kg)    BP 128/64   Pulse 98   Ht 5\' 8"  (1.727 m)   Wt 193  lb (87.5 kg)   SpO2 97%   BMI 29.35 kg/m   Assessment and Plan:  Problem List Items Addressed This Visit       Unprioritized   Trigger little finger of right hand    He has developed triggering of two fingers.  It is mild at this time. He has a PT at [facility] who says they can work on his hands to help avoid surgery so he would like a referral.      Relevant Orders   Ambulatory referral to Physical Therapy   Other Visit Diagnoses     Acute cystitis with hematuria    -  Primary   push fluids we will call with culture results   Relevant Medications   sulfamethoxazole-trimethoprim (BACTRIM DS) 800-160 MG tablet   Dysuria       Relevant Orders   POCT Urinalysis Dipstick (Completed)   Urine Culture       Return in about 1 month (around 05/11/2023) for DM.    Reubin Milan, MD [facility] [place]

## 2023-04-11 ENCOUNTER — Ambulatory Visit: Payer: Medicare Other | Admitting: Physician Assistant

## 2023-04-12 ENCOUNTER — Ambulatory Visit: Payer: Self-pay

## 2023-04-12 NOTE — Telephone Encounter (Signed)
Noted  KP 

## 2023-04-12 NOTE — Telephone Encounter (Signed)
Chief Complaint: Pain with urination Symptoms: Frequency in urination and pain 7/10 Frequency: comes and goes  Pertinent Negatives: Patient denies blood in urine, flank pain  Disposition: [] ED /[] Urgent Care (no appt availability in office) / [] Appointment(In office/virtual)/ []  [place] [facility]/ [x] Home Care/ [] Refused Recommended Disposition /[] [place] [facility]/ []  Follow-up with PCP Additional Notes: Patient reports taking antibiotics as prescribed and pain is still present during urination. Patient reports symptoms are a little better from when he was seen in office but not much. Patient reports pain 7/10 during urination. Patient denies blood in urine, flank pain, inability to urinate. Patient given care advise and verbalized understanding.  Advised I would forward message to provider for any additional recommendations.  Summary: pain with urination   Patient called stated his frequency is every half hour and the pain is high. The pain medication is not working. Please f/u with patient     Reason for Disposition  [1] Taking antibiotic < 72 hours (3 days) for UTI AND [2] painful urination or frequency is SAME (unchanged, not better)  Answer Assessment - Initial Assessment Questions 1. MAIN SYMPTOM: "What is the main symptom you are concerned about?" (e.g., painful urination, urine frequency)     Painful urination  2. BETTER-SAME-WORSE: "Are you getting better, staying the same, or getting worse compared to how you felt at your last visit to the doctor (most recent medical visit)?"     Slightly better 3. PAIN: "How bad is the pain?"  (e.g., Scale 1-10; mild, moderate, or severe)   - MILD (1-3): complains slightly about urination hurting   - MODERATE (4-7): interferes with normal activities     - SEVERE (8-10): excruciating, unwilling or unable to urinate because of the pain      7/10 4. FEVER: "Do you have a fever?" If Yes, ask: "What is it, how was it measured, and when did  it start?"     No 5. OTHER SYMPTOMS: "Do you have any other symptoms?" (e.g., blood in the urine, flank pain, scrotal pain or swelling)     Fequency 6. DIAGNOSIS: "When was the UTI diagnosed?" "By whom?" "Was it a kidney infection, bladder infection or both?"     Bladder infection by Dr. Judithann Graves 7. ANTIBIOTIC: "What antibiotic(s) are you taking?" "How many times per day?"     Bactrim DS 1 pill BID  8. ANTIBIOTIC - START DATE: "When did you start taking the antibiotic?"     04/10/23  Protocols used: Urinary Tract Infection on Antibiotic Follow-up Call - Male-A-AH

## 2023-04-12 NOTE — Telephone Encounter (Signed)
Please review.  "Patient reports taking antibiotics as prescribed and pain is still present during urination. Patient reports symptoms are a little better from when he was seen in office but not much. Patient reports pain 7/10 during urination. Patient denies blood in urine, flank pain, inability to urinate."  KP

## 2023-04-12 NOTE — Telephone Encounter (Signed)
Spoke to patient and gave him Dr. Karn Cassis comments. Patient verbalized understanding. Patient also verified the name of the antibiotic he was taking.  Thomas Milan, MD  to Person, [name], [facility]     04/12/23 11:52 AM He can try taking AZO to calm the bladder discomfort - it is OTC and take three times a day for only 2 days.  The urine culture is not back ye

## 2023-04-13 ENCOUNTER — Telehealth: Payer: Self-pay | Admitting: Urology

## 2023-04-13 NOTE — Telephone Encounter (Signed)
Patient called office and stated that he had seen Dr. Judithann Graves on 04/10/23 for painful urinary frequency, and was prescribed an antibiotic, but was notified today that culture came back showing no infection. He was advised by Dr. Karn Cassis office to contact his oncologist to see if issue is related to prostate cancer. He called his oncologist and was told this was not a cancer issue, and that he should contact Dr. Apolinar Junes. Please advise patient on how to proceed. He has a follow up appt scheduled for 05/02/23 with Dr. Apolinar Junes. Should he be seen sooner?

## 2023-04-16 DIAGNOSIS — Z5111 Encounter for antineoplastic chemotherapy: Secondary | ICD-10-CM | POA: Diagnosis not present

## 2023-04-16 DIAGNOSIS — C61 Malignant neoplasm of prostate: Secondary | ICD-10-CM | POA: Diagnosis not present

## 2023-04-16 DIAGNOSIS — C772 Secondary and unspecified malignant neoplasm of intra-abdominal lymph nodes: Secondary | ICD-10-CM | POA: Diagnosis not present

## 2023-04-16 DIAGNOSIS — Z79818 Long term (current) use of other agents affecting estrogen receptors and estrogen levels: Secondary | ICD-10-CM | POA: Diagnosis not present

## 2023-04-16 NOTE — Telephone Encounter (Signed)
Called pt to informed him that his appointment on 05/02/23 is Dr.Brandon's earliest available, LVM for patient to return call

## 2023-04-17 NOTE — Progress Notes (Deleted)
Referring Physician:  Reubin Milan, MD [address] [place],  [place] [address]  Primary Physician:  Reubin Milan, MD  History of Present Illness: 04/17/2023 Mr. Colburn Korson has a history of DM, hyperlipidemia, GERD, HTN, and prostate CA.   I have seen him in the past for a C5 fracture s/p fall on 06/12/22.   Last seen by me on 02/12/23 for LBP. He has known lumbar spondylosis and DDD along with remote L3 compression fracture.   He was to continue with PT at [facility]. Note from them on 04/04/23- he has been to 7 visits and has met some of his goals. Recommended he continue with PT.   He is here for follow up.          MRI of lumbar spine ordered and he is here to review it.   He continues with intermittent LBP that is only when he puts on his socks in the morning. After this, he has no LBP. No leg pain. No numbness, tingling, or weakness.   He is being treated for frequent urination and diarrhea- this has been going on for months.   Conservative measures:  Physical therapy: has done 7 visits at [facility] and is still going*** Multimodal medical therapy including regular antiinflammatories: none Injections: has not received epidural steroid injections  Past Surgery: no previous spine surgery  Thayne N Pfefferle has no symptoms of cervical myelopathy.  The symptoms are causing a significant impact on the patient's life.   Review of Systems:  A 10 point review of systems is negative, except for the pertinent positives and negatives detailed in the HPI.  Past Medical History: Past Medical History:  Diagnosis Date   Arthritis    Cancer (HCC)    Diabetes mellitus without complication (HCC)    GERD (gastroesophageal reflux disease)    Haglund's deformity    History of kidney stones    Hyperlipidemia    Hypertension    Intractable hiccups 02/05/2017   Prostate CA (HCC)    Sleep apnea     Past Surgical History: Past Surgical History:  Procedure Laterality  Date   APPENDECTOMY     COLONOSCOPY  2008   benign polyps   COLONOSCOPY WITH PROPOFOL N/A 08/07/2017   Procedure: COLONOSCOPY WITH PROPOFOL;  Surgeon: Christena Deem, MD;  Location: [facility] ENDOSCOPY;  Service: Endoscopy;  Laterality: N/A;   COLONOSCOPY WITH PROPOFOL N/A 12/03/2020   Procedure: COLONOSCOPY WITH PROPOFOL;  Surgeon: Regis Bill, MD;  Location: [facility] ENDOSCOPY;  Service: Endoscopy;  Laterality: N/A;   ESOPHAGOGASTRODUODENOSCOPY (EGD) WITH PROPOFOL N/A 08/07/2017   Procedure: ESOPHAGOGASTRODUODENOSCOPY (EGD) WITH PROPOFOL;  Surgeon: Christena Deem, MD;  Location: [facility] ENDOSCOPY;  Service: Endoscopy;  Laterality: N/A;   INSERTION OF MESH Right 01/16/2022   Procedure: INSERTION OF MESH;  Surgeon: Campbell Lerner, MD;  Location: [facility] ORS;  Service: General;  Laterality: Right;   lithopexy     PROSTATE BIOPSY  2012   malignant   REPLACEMENT TOTAL KNEE Bilateral    [id number]    Allergies: Allergies as of 04/18/2023 - Review Complete 04/10/2023  Allergen Reaction Noted   Ace inhibitors Cough 01/19/2016    Medications: Outpatient Encounter Medications as of 04/18/2023  Medication Sig   calcium carbonate (OS-CAL - DOSED IN MG OF ELEMENTAL CALCIUM) 1250 (500 Ca) MG tablet Take 1 tablet by mouth.   enzalutamide (XTANDI) 40 MG tablet Take 40 mg by mouth in the morning.   lidocaine (LIDODERM) 5 %  Place 1 patch onto the skin every 12 (twelve) hours. Remove & Discard patch within 12 hours or as directed by MD   loperamide (IMODIUM A-D) 2 MG tablet Take 2 mg by mouth 4 (four) times daily as needed for diarrhea or loose stools.   metFORMIN (GLUCOPHAGE) 1000 MG tablet Take 1 tablet (1,000 mg total) by mouth daily with breakfast.   omeprazole (PRILOSEC) 20 MG capsule Take 1 capsule (20 mg total) by mouth daily.   sulfamethoxazole-trimethoprim (BACTRIM DS) 800-160 MG tablet Take 1 tablet by mouth 2 (two) times daily for 10 days.   No facility-administered encounter  medications on file as of 04/18/2023.    Social History: Social History   Tobacco Use   Smoking status: Former    Current packs/day: 0.00    Types: Cigarettes    Quit date: 1976    Years since quitting: 48.6   Smokeless tobacco: Never  Vaping Use   Vaping status: Never Used  Substance Use Topics   Alcohol use: Yes    Alcohol/week: 14.0 standard drinks of alcohol    Types: 14 Shots of liquor per week    Comment: 2 scotch drinks per night   Drug use: No    Family Medical History: Family History  Problem Relation Age of Onset   Diabetes Mother    Colon cancer Mother    Colon cancer Father    Prostate cancer Neg Hx    Bladder Cancer Neg Hx    Kidney cancer Neg Hx     Physical Examination: There were no vitals filed for this visit.      Awake, alert, oriented to person, place, and time.  Speech is clear and fluent. Fund of knowledge is appropriate.   Cranial Nerves: Pupils equal round and reactive to light.  Facial tone is symmetric.    He has no lower lumbar tenderness.   No abnormal lesions on exposed skin.   Strength:  Side Iliopsoas Quads Hamstring PF DF EHL  R 5 5 5 5 5 5   L 5 5 5 5 5 5     Bilateral lower extremity sensation is intact to light touch.     Gait is normal.    Medical Decision Making  Imaging: Nothing new.    Assessment and Plan: [name] is a pleasant 82 y.o. male with intermittent LBP that is only when he puts on his socks in the morning. After this, he has no LBP. No leg pain. No numbness, tingling, or weakness.   He has known lumbar spondylosis and DDD along with remote L3 compression fracture.   Treatment options discussed with patient and following plan made:   - Continue with PT for lumbar spine.  - Discussed lumbar injections. He wants to hold off for now. Can revisit if pain gets worse/more frequent.  - History of prostate CA- he is seeing Dr. Apolinar Junes. Message sent to her regarding signs of chronic bladder outlet  obstruction seen on lumbar MRI.  - Follow up with me in 8-10 weeks and prn.   I spent a total of 20 minutes in face-to-face and non-face-to-face activities related to this patient's care today including review of outside records, review of imaging, review of symptoms, physical exam, discussion of differential diagnosis, discussion of treatment options, and documentation.   Drake Leach PA-C Dept. of Neurosurgery

## 2023-04-18 ENCOUNTER — Ambulatory Visit: Payer: Medicare Other | Admitting: Orthopedic Surgery

## 2023-04-18 DIAGNOSIS — Z79899 Other long term (current) drug therapy: Secondary | ICD-10-CM | POA: Diagnosis not present

## 2023-04-18 DIAGNOSIS — M545 Low back pain, unspecified: Secondary | ICD-10-CM | POA: Diagnosis not present

## 2023-04-18 DIAGNOSIS — Z79818 Long term (current) use of other agents affecting estrogen receptors and estrogen levels: Secondary | ICD-10-CM | POA: Diagnosis not present

## 2023-04-18 DIAGNOSIS — C61 Malignant neoplasm of prostate: Secondary | ICD-10-CM | POA: Diagnosis not present

## 2023-04-18 DIAGNOSIS — C772 Secondary and unspecified malignant neoplasm of intra-abdominal lymph nodes: Secondary | ICD-10-CM | POA: Diagnosis not present

## 2023-04-23 ENCOUNTER — Encounter: Payer: Self-pay | Admitting: Physician Assistant

## 2023-04-23 ENCOUNTER — Ambulatory Visit (INDEPENDENT_AMBULATORY_CARE_PROVIDER_SITE_OTHER): Payer: Medicare Other | Admitting: Physician Assistant

## 2023-04-23 ENCOUNTER — Other Ambulatory Visit: Payer: Self-pay | Admitting: Physician Assistant

## 2023-04-23 VITALS — BP 134/74 | HR 85 | Wt 184.0 lb

## 2023-04-23 DIAGNOSIS — R300 Dysuria: Secondary | ICD-10-CM | POA: Diagnosis not present

## 2023-04-23 DIAGNOSIS — R350 Frequency of micturition: Secondary | ICD-10-CM | POA: Diagnosis not present

## 2023-04-23 LAB — BLADDER SCAN AMB NON-IMAGING

## 2023-04-23 LAB — MICROSCOPIC EXAMINATION: WBC, UA: >30 /hpf — AB (ref 0–5)

## 2023-04-23 LAB — URINALYSIS, COMPLETE
Bilirubin, UA: NEGATIVE
Glucose, UA: NEGATIVE
Ketones, UA: NEGATIVE
Nitrite, UA: NEGATIVE
Specific Gravity, UA: >1.03 — ABNORMAL HIGH (ref 1.005–1.030)
Urobilinogen, Ur: 0.2 mg/dL (ref 0.2–1.0)
pH, UA: 5 (ref 5.0–7.5)

## 2023-04-23 MED ORDER — SULFAMETHOXAZOLE-TRIMETHOPRIM 800-160 MG PO TABS: 1.0000 | ORAL_TABLET | Freq: Two times a day (BID) | ORAL | 0 refills | Status: DC

## 2023-04-23 MED ORDER — URIBEL 118 MG PO CAPS: 1.0000 | ORAL_CAPSULE | Freq: Four times a day (QID) | ORAL | 5 refills | Status: DC | PRN

## 2023-04-23 MED ORDER — TROSPIUM CHLORIDE 20 MG PO TABS
20.0000 mg | ORAL_TABLET | Freq: Two times a day (BID) | ORAL | 0 refills | Status: AC | PRN
Start: 2023-04-23 — End: 2023-05-23

## 2023-04-23 NOTE — Progress Notes (Signed)
Referring Physician:  Reubin Milan, MD [address] [place],  [place] [address]  Primary Physician:  Thomas Milan, MD  History of Present Illness: 05/03/2023 [name] has a history of DM, hyperlipidemia, GERD, HTN, and prostate CA.   I have seen him in the past for a C5 fracture s/p fall on 06/12/22.   Last seen by me on 02/12/23 for LBP. He has known lumbar spondylosis and DDD along with remote L3 compression fracture.   He was to continue with PT at [facility]. Note from them on 04/04/23- he has been to 7 visits and has met some of his goals. Recommended he continue with PT.   He is here for follow up.   He is doing better. He still has some intermittent back pain when he ties his shoes, but overall, it is much improved. No leg pain. No numbness, tingling, or weakness in his legs.    Conservative measures:  Physical therapy: has done 7 visits at [facility] and is still going Multimodal medical therapy including regular antiinflammatories: none Injections: has not received epidural steroid injections  Past Surgery: no previous spine surgery  Thomas Allen has no symptoms of cervical myelopathy.  The symptoms are causing a significant impact on the patient's life.   Review of Systems:  A 10 point review of systems is negative, except for the pertinent positives and negatives detailed in the HPI.  Past Medical History: Past Medical History:  Diagnosis Date   Arthritis    Cancer (HCC)    Diabetes mellitus without complication (HCC)    GERD (gastroesophageal reflux disease)    Haglund's deformity    History of kidney stones    Hyperlipidemia    Hypertension    Intractable hiccups 02/05/2017   Prostate CA (HCC)    Sleep apnea     Past Surgical History: Past Surgical History:  Procedure Laterality Date   APPENDECTOMY     COLONOSCOPY  2008   benign polyps   COLONOSCOPY WITH PROPOFOL N/A 08/07/2017   Procedure: COLONOSCOPY WITH PROPOFOL;  Surgeon:  Christena Deem, MD;  Location: [facility] ENDOSCOPY;  Service: Endoscopy;  Laterality: N/A;   COLONOSCOPY WITH PROPOFOL N/A 12/03/2020   Procedure: COLONOSCOPY WITH PROPOFOL;  Surgeon: Regis Bill, MD;  Location: [facility] ENDOSCOPY;  Service: Endoscopy;  Laterality: N/A;   ESOPHAGOGASTRODUODENOSCOPY (EGD) WITH PROPOFOL N/A 08/07/2017   Procedure: ESOPHAGOGASTRODUODENOSCOPY (EGD) WITH PROPOFOL;  Surgeon: Christena Deem, MD;  Location: [facility] ENDOSCOPY;  Service: Endoscopy;  Laterality: N/A;   INSERTION OF MESH Right 01/16/2022   Procedure: INSERTION OF MESH;  Surgeon: Campbell Lerner, MD;  Location: [facility] ORS;  Service: General;  Laterality: Right;   lithopexy     PROSTATE BIOPSY  2012   malignant   REPLACEMENT TOTAL KNEE Bilateral    [id number]    Allergies: Allergies as of 05/03/2023 - Review Complete 05/03/2023  Allergen Reaction Noted   Ace inhibitors Cough 01/19/2016    Medications: Outpatient Encounter Medications as of 05/03/2023  Medication Sig   calcium carbonate (OS-CAL - DOSED IN MG OF ELEMENTAL CALCIUM) 1250 (500 Ca) MG tablet Take 1 tablet by mouth.   loperamide (IMODIUM A-D) 2 MG tablet Take 2 mg by mouth 4 (four) times daily as needed for diarrhea or loose stools.   metFORMIN (GLUCOPHAGE) 1000 MG tablet Take 1 tablet (1,000 mg total) by mouth daily with breakfast.   Meth-Hyo-M Bl-Na Phos-Ph Sal (URIBEL) 118 MG CAPS Take 1 capsule (118 mg  total) by mouth 4 (four) times daily as needed.   omeprazole (PRILOSEC) 20 MG capsule Take 1 capsule (20 mg total) by mouth daily.   trospium (SANCTURA) 20 MG tablet Take 1 tablet (20 mg total) by mouth every 12 (twelve) hours as needed.   [DISCONTINUED] enzalutamide (XTANDI) 40 MG tablet Take 40 mg by mouth in the morning.   [DISCONTINUED] lidocaine (LIDODERM) 5 % Place 1 patch onto the skin every 12 (twelve) hours. Remove & Discard patch within 12 hours or as directed by MD (Patient not taking: Reported on 05/03/2023)   [DISCONTINUED]  Meth-Hyo-M Bl-Na Phos-Ph Sal (URIBEL) 118 MG CAPS Take 1 capsule (118 mg total) by mouth 4 (four) times daily as needed.   [DISCONTINUED] sulfamethoxazole-trimethoprim (BACTRIM DS) 800-160 MG tablet Take 1 tablet by mouth 2 (two) times daily for 21 days.   No facility-administered encounter medications on file as of 05/03/2023.    Social History: Social History   Tobacco Use   Smoking status: Former    Current packs/day: 0.00    Types: Cigarettes    Quit date: 1976    Years since quitting: 48.6   Smokeless tobacco: Never  Vaping Use   Vaping status: Never Used  Substance Use Topics   Alcohol use: Yes    Alcohol/week: 14.0 standard drinks of alcohol    Types: 14 Shots of liquor per week    Comment: 2 scotch drinks per night   Drug use: No    Family Medical History: Family History  Problem Relation Age of Onset   Diabetes Mother    Colon cancer Mother    Colon cancer Father    Prostate cancer Neg Hx    Bladder Cancer Neg Hx    Kidney cancer Neg Hx     Physical Examination: Vitals:   05/03/23 1417  BP: 128/76    Awake, alert, oriented to person, place, and time.  Speech is clear and fluent. Fund of knowledge is appropriate.   Cranial Nerves: Pupils equal round and reactive to light.  Facial tone is symmetric.    He has no lower lumbar tenderness.   No abnormal lesions on exposed skin.   Strength:  Side Iliopsoas Quads Hamstring PF DF EHL  R 5 5 5 5 5 5   L 5 5 5 5 5 5     Bilateral lower extremity sensation is intact to light touch.     Gait is normal.    Medical Decision Making  Imaging: Nothing new.    Assessment and Plan: [name] is a pleasant 82 y.o. male with intermittent LBP that is improving with PT. 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 No leg pain. No numbness, tingling, or weakness.   He has known lumbar spondylosis and DDD along with remote L3 compression fracture.   Treatment options discussed with patient and following plan  made:   - Continue with PT for lumbar spine.  - Form filled out for handicapped placard- temporary for 6 months.  - Consider lumbar injections if pain gets worse/more frequent.  - He will f/u with me in 3 months.   Referral to ortho at [facility] for trigger finger- right small and left middle finger.   I spent a total of 15 minutes in face-to-face and non-face-to-face activities related to this patient's care today including review of outside records, review of imaging, review of symptoms, physical exam, discussion of differential diagnosis, discussion of treatment options, and documentation.   Drake Leach PA-C Dept.  of Neurosurgery

## 2023-04-23 NOTE — Progress Notes (Signed)
04/23/2023 10:35 AM   Thomas Allen Feb 11, 1941 [id number]  CC: Chief Complaint  Patient presents with   Dysuria   HPI: Thomas Allen is a 82 y.o. male with PMH prostate cancer on ADT and Xtandi managed at [facility] and urinary frequency and dysuria who underwent cystoscopy with Dr. Apolinar Junes on 01/02/2023 and is scheduled to follow-up with her regarding his LUTS later this month who presents today for same-day add-on with reports of the inability to void.   He saw Dr. Judithann Graves for the same on 04/10/2023.  UA was notable for 3+ blood, 3+ leukocytes, and nitrites.  He was prescribed Bactrim DS twice daily x 10 days, but this was stopped when his urine culture finalized with no growth.  PSA on 04/16/2023 was undetectable.  Today he reports 3-week history of suddenly worsened urinary frequency and dribbling stream.  This improved somewhat on Bactrim and Azo per Dr. Judithann Graves, but worsened again yesterday.  He reports frequency every 10 minutes, severe dysuria, and hesitancy.  He stopped Gemtesa previously due to diarrhea.  No history of constipation, dry mouth, or dry eye.  Notably, there was an area seen on cystoscopy earlier this year showing slight texturized appearance, possibly inflammatory.  Urine cytology was ordered, however I do not see the results in his chart.  In-office UA today positive for trace intact blood, 2+ protein, and 1+ leukocytes; urine microscopy with >30 WBCs/HPF, 3-10 RBCs/HPF, and moderate bacteria. PVR 25mL.  PMH: Past Medical History:  Diagnosis Date   Arthritis    Cancer (HCC)    Diabetes mellitus without complication (HCC)    GERD (gastroesophageal reflux disease)    Haglund's deformity    History of kidney stones    Hyperlipidemia    Hypertension    Intractable hiccups 02/05/2017   Prostate CA [facility])    Sleep apnea     Surgical History: Past Surgical History:  Procedure Laterality Date   APPENDECTOMY     COLONOSCOPY  2008   benign polyps   COLONOSCOPY WITH  PROPOFOL N/A 08/07/2017   Procedure: COLONOSCOPY WITH PROPOFOL;  Surgeon: Christena Deem, MD;  Location: [facility] ENDOSCOPY;  Service: Endoscopy;  Laterality: N/A;   COLONOSCOPY WITH PROPOFOL N/A 12/03/2020   Procedure: COLONOSCOPY WITH PROPOFOL;  Surgeon: Regis Bill, MD;  Location: [facility] ENDOSCOPY;  Service: Endoscopy;  Laterality: N/A;   ESOPHAGOGASTRODUODENOSCOPY (EGD) WITH PROPOFOL N/A 08/07/2017   Procedure: ESOPHAGOGASTRODUODENOSCOPY (EGD) WITH PROPOFOL;  Surgeon: Christena Deem, MD;  Location: [facility] ENDOSCOPY;  Service: Endoscopy;  Laterality: N/A;   INSERTION OF MESH Right 01/16/2022   Procedure: INSERTION OF MESH;  Surgeon: Campbell Lerner, MD;  Location: [facility] ORS;  Service: General;  Laterality: Right;   lithopexy     PROSTATE BIOPSY  2012   malignant   REPLACEMENT TOTAL KNEE Bilateral    [id number]    Home Medications:  Allergies as of 04/23/2023       Reactions   Ace Inhibitors Cough        Medication List        Accurate as of April 23, 2023 10:35 AM. If you have any questions, ask your nurse or doctor.          calcium carbonate 1250 (500 Ca) MG tablet Commonly known as: OS-CAL - dosed in mg of elemental calcium Take 1 tablet by mouth.   enzalutamide 40 MG tablet Commonly known as: XTANDI Take 40 mg by mouth in the morning.   lidocaine 5 % Commonly known  as: Lidoderm Place 1 patch onto the skin every 12 (twelve) hours. Remove & Discard patch within 12 hours or as directed by MD   loperamide 2 MG tablet Commonly known as: IMODIUM A-D Take 2 mg by mouth 4 (four) times daily as needed for diarrhea or loose stools.   metFORMIN 1000 MG tablet Commonly known as: GLUCOPHAGE Take 1 tablet (1,000 mg total) by mouth daily with breakfast.   omeprazole 20 MG capsule Commonly known as: PRILOSEC Take 1 capsule (20 mg total) by mouth daily.        Allergies:  Allergies  Allergen Reactions   Ace Inhibitors Cough    Family History: Family  History  Problem Relation Age of Onset   Diabetes Mother    Colon cancer Mother    Colon cancer Father    Prostate cancer Neg Hx    Bladder Cancer Neg Hx    Kidney cancer Neg Hx     Social History:   reports that he quit smoking about 48 years ago. His smoking use included cigarettes. He has never used smokeless tobacco. He reports current alcohol use of about 14.0 standard drinks of alcohol per week. He reports that he does not use drugs.  Physical Exam: BP 134/74   Pulse 85   Wt 184 lb (83.5 kg)   BMI 27.98 kg/m   Constitutional:  Alert and oriented, no acute distress, nontoxic appearing HEENT: [place], AT Cardiovascular: No clubbing, cyanosis, or edema Respiratory: Normal respiratory effort, no increased work of breathing Skin: No rashes, bruises or suspicious lesions Neurologic: Grossly intact, no focal deficits, moving all 4 extremities Psychiatric: Normal mood and affect  Laboratory Data: Results for orders placed or performed in visit on 04/23/23  Microscopic Examination   Urine  Result Value Ref Range   WBC, UA >30 (A) 0 - 5 /hpf   RBC, Urine 3-10 (A) 0 - 2 /hpf   Epithelial Cells (non renal) 0-10 0 - 10 /hpf   Bacteria, UA Moderate (A) None seen/Few  Urinalysis, Complete  Result Value Ref Range   Specific Gravity, UA >1.030 (H) 1.005 - 1.030   pH, UA 5.0 5.0 - 7.5   Color, UA Yellow Yellow   Appearance Ur Cloudy (A) Clear   Leukocytes,UA 1+ (A) Negative   Protein,UA 2+ (A) Negative/Trace   Glucose, UA Negative Negative   Ketones, UA Negative Negative   RBC, UA Trace (A) Negative   Bilirubin, UA Negative Negative   Urobilinogen, Ur 0.2 0.2 - 1.0 mg/dL   Nitrite, UA Negative Negative   Microscopic Examination See below:   Bladder Scan (Post Void Residual) in office  Result Value Ref Range   Scan Result 25ml    Assessment & Plan:   1. Dysuria UA appears grossly infected today, not terribly different from its appearance with Dr. Judithann Graves earlier this month.   Given symptomatic improvement on Bactrim, will resume this patient 3 weeks for possible bacterial prostatitis and repeat a urine culture today.  Will also prescribe Uribel to help with dysuria.  He does need a urine cytology per Dr. Delana Meyer prior note, however will defer this today given bacteriuria on urine microscopy.  Would recommend obtaining urine cytology at his next scheduled visit with her later this month. - Bladder Scan (Post Void Residual) in office - Urinalysis, Complete - CULTURE, URINE COMPREHENSIVE - Meth-Hyo-M Bl-Na Phos-Ph Sal (URIBEL) 118 MG CAPS; Take 1 capsule (118 mg total) by mouth 4 (four) times daily as needed.  Dispense: 30  capsule; Refill: 5 - sulfamethoxazole-trimethoprim (BACTRIM DS) 800-160 MG tablet; Take 1 tablet by mouth 2 (two) times daily for 21 days.  Dispense: 42 tablet; Refill: 0  2. Urinary frequency Diarrhea on Gemtesa.  Will prescribe trospium for now.  Will consider Myrbetriq as an alternative if he develops anticholinergic side effects on this. - trospium (SANCTURA) 20 MG tablet; Take 1 tablet (20 mg total) by mouth every 12 (twelve) hours as needed.  Dispense: 60 tablet; Refill: 0   Return for Keep scheduled appt with Dr. Apolinar Junes.  Carman Ching, PA-C  [facility] [address], [address] [place], [place] [address] [phone]

## 2023-04-23 NOTE — Patient Instructions (Signed)
Resume Bactrim antibiotic twice daily. This is the same antibiotic Dr. Judithann Graves prescribed. Take trospium 1-2 times daily to help with urgency/frequency. If you develop constipation, dry mouth, or dry eye with this, please let us know. Take Uribel to help with painful urination.

## 2023-04-24 MED ORDER — URIBEL 118 MG PO CAPS
1.0000 | ORAL_CAPSULE | Freq: Four times a day (QID) | ORAL | 5 refills | Status: DC | PRN
Start: 2023-04-24 — End: 2024-01-23

## 2023-04-24 NOTE — Telephone Encounter (Signed)
I called patient about sending Uribel to [facility] on [address], because they had this medication stocked up , at their location, patient had already received Uribel from a different pharmacy and has refills. I told him that it would be at the [facility], just in case you run out.

## 2023-04-25 DIAGNOSIS — M545 Low back pain, unspecified: Secondary | ICD-10-CM | POA: Diagnosis not present

## 2023-04-26 LAB — CULTURE, URINE COMPREHENSIVE

## 2023-04-30 DIAGNOSIS — R82998 Other abnormal findings in urine: Secondary | ICD-10-CM | POA: Diagnosis not present

## 2023-05-02 ENCOUNTER — Ambulatory Visit (INDEPENDENT_AMBULATORY_CARE_PROVIDER_SITE_OTHER): Payer: Medicare Other | Admitting: Urology

## 2023-05-02 VITALS — BP 122/74 | HR 99 | Ht 69.6 in | Wt 191.5 lb

## 2023-05-02 DIAGNOSIS — R350 Frequency of micturition: Secondary | ICD-10-CM

## 2023-05-02 DIAGNOSIS — R8281 Pyuria: Secondary | ICD-10-CM

## 2023-05-02 DIAGNOSIS — M545 Low back pain, unspecified: Secondary | ICD-10-CM | POA: Diagnosis not present

## 2023-05-02 LAB — URINALYSIS, COMPLETE
Bilirubin, UA: NEGATIVE
Glucose, UA: NEGATIVE
Ketones, UA: NEGATIVE
Nitrite, UA: NEGATIVE
Specific Gravity, UA: 1.02 (ref 1.005–1.030)
Urobilinogen, Ur: 0.2 mg/dL (ref 0.2–1.0)
pH, UA: 7 (ref 5.0–7.5)

## 2023-05-02 LAB — MICROSCOPIC EXAMINATION

## 2023-05-02 NOTE — Progress Notes (Signed)
Marcelle Overlie Plume,acting as a scribe for Vanna Scotland, MD.,have documented all relevant documentation on the behalf of Vanna Scotland, MD,as directed by  Vanna Scotland, MD while in the presence of Vanna Scotland, MD.  05/02/2023 3:56 PM   Bevelyn Buckles July 13, 1941 [id number]  Referring provider: Reubin Milan, MD [address] [place],  [place] [address]  Chief Complaint  Patient presents with   Urinary Frequency    HPI: 82 year-old male returns today for follow up of urinary issues.   He has a personal history of castrate-sensitive metastatic prostate cancer. He is managed at [facility] on ADT. He recently stopped Xtandi due to intolerance. Please see previous note for details. More recently, he struggled with urinary issues. Specifically, he has been having issues with recurrent urinary tract infections and urinary frequency along with dysuria. He is status post XRT.   He had a cystoscopy that showed an area which primarily looked inflammatory, no obvious papillary tumor. Urine cytology was ordered but not sent for unclear reasons. He continues to have ongoing urinary issues. He was started on Gemtesa 75 mg.   This was subsequently changed to [place].  His urinalysis today does have 11-30 WBC per high power field, otherwise unremarkable.   Notably, he saw Carman Ching on 04/23/2023, complaining of the same issue. He was given Keflex for presumed UTI based on the appearance of his urinalysis. The culture was unremarkable. All of his urine cultures with [place] have been negative other than isolated lower colony count of Staph epidermis in January.  He is now on [place].   Today, he reports improving urinary frequency, weak stream, and dysuria since beginning the medications.    PMH: Past Medical History:  Diagnosis Date   Arthritis    Cancer (HCC)    Diabetes mellitus without complication (HCC)    GERD (gastroesophageal reflux disease)    Haglund's deformity     History of kidney stones    Hyperlipidemia    Hypertension    Intractable hiccups 02/05/2017   Prostate CA [facility])    Sleep apnea     Surgical History: Past Surgical History:  Procedure Laterality Date   APPENDECTOMY     COLONOSCOPY  2008   benign polyps   COLONOSCOPY WITH PROPOFOL N/A 08/07/2017   Procedure: COLONOSCOPY WITH PROPOFOL;  Surgeon: Christena Deem, MD;  Location: [facility] ENDOSCOPY;  Service: Endoscopy;  Laterality: N/A;   COLONOSCOPY WITH PROPOFOL N/A 12/03/2020   Procedure: COLONOSCOPY WITH PROPOFOL;  Surgeon: Regis Bill, MD;  Location: [facility] ENDOSCOPY;  Service: Endoscopy;  Laterality: N/A;   ESOPHAGOGASTRODUODENOSCOPY (EGD) WITH PROPOFOL N/A 08/07/2017   Procedure: ESOPHAGOGASTRODUODENOSCOPY (EGD) WITH PROPOFOL;  Surgeon: Christena Deem, MD;  Location: [facility] ENDOSCOPY;  Service: Endoscopy;  Laterality: N/A;   INSERTION OF MESH Right 01/16/2022   Procedure: INSERTION OF MESH;  Surgeon: Campbell Lerner, MD;  Location: [facility] ORS;  Service: General;  Laterality: Right;   lithopexy     PROSTATE BIOPSY  2012   malignant   REPLACEMENT TOTAL KNEE Bilateral    [id number]    Home Medications:  Allergies as of 05/02/2023       Reactions   Ace Inhibitors Cough        Medication List        Accurate as of May 02, 2023  3:56 PM. If you have any questions, ask your nurse or doctor.          STOP taking these medications  enzalutamide 40 MG tablet Commonly known as: XTANDI   sulfamethoxazole-trimethoprim 800-160 MG tablet Commonly known as: BACTRIM DS       TAKE these medications    calcium carbonate 1250 (500 Ca) MG tablet Commonly known as: OS-CAL - dosed in mg of elemental calcium Take 1 tablet by mouth.   lidocaine 5 % Commonly known as: Lidoderm Place 1 patch onto the skin every 12 (twelve) hours. Remove & Discard patch within 12 hours or as directed by MD   loperamide 2 MG tablet Commonly known as: IMODIUM A-D Take 2 mg by mouth 4  (four) times daily as needed for diarrhea or loose stools.   metFORMIN 1000 MG tablet Commonly known as: GLUCOPHAGE Take 1 tablet (1,000 mg total) by mouth daily with breakfast.   omeprazole 20 MG capsule Commonly known as: PRILOSEC Take 1 capsule (20 mg total) by mouth daily.   trospium 20 MG tablet Commonly known as: SANCTURA Take 1 tablet (20 mg total) by mouth every 12 (twelve) hours as needed.   Uribel 118 MG Caps Take 1 capsule (118 mg total) by mouth 4 (four) times daily as needed.        Allergies:  Allergies  Allergen Reactions   Ace Inhibitors Cough    Family History: Family History  Problem Relation Age of Onset   Diabetes Mother    Colon cancer Mother    Colon cancer Father    Prostate cancer Neg Hx    Bladder Cancer Neg Hx    Kidney cancer Neg Hx     Social History:  reports that he quit smoking about 48 years ago. His smoking use included cigarettes. He has never used smokeless tobacco. He reports current alcohol use of about 14.0 standard drinks of alcohol per week. He reports that he does not use drugs.   Physical Exam: BP 122/74   Pulse 99   Ht 5' 9.6" (1.768 m)   Wt 191 lb 8 oz (86.9 kg)   BMI 27.79 kg/m   Constitutional:  Alert and oriented, No acute distress. HEENT: [place] AT, moist mucus membranes.  Trachea midline, no masses. Neurologic: Grossly intact, no focal deficits, moving all 4 extremities. Psychiatric: Normal mood and affect.   Assessment & Plan:    1. Pyuria - Urine culture today to r/o infection but no suspected, last culture was negative.  - Continue Sanctura (Trospium) for urinary frequency. - Reduce Urogesic Blue (Urobel) to twice a day to manage symptoms and reduce cost. - Discontinue antibiotics as there is no evidence of infection based on previous culture. - Send urine for cytology to check for cancer cells. -Suspect symptoms related to radiation cystitis; outlet procedure relatively high risk for incontinence as  previously discussed.  Will manage conservatively and then adjust plan as needed based on symptom control as needed.  Return in about 1 month (around 06/02/2023) for PVR, IPSS, and UA.  I have reviewed the above documentation for accuracy and completeness, and I agree with the above.   Vanna Scotland, MD    [facility] [address] [place], [place] [address] [phone]

## 2023-05-02 NOTE — Patient Instructions (Addendum)
Cut back Uri-bel to twice daily and  see if that is still effective  Stop antibiotics  Continue trospium  Follow up in 1 month  We will send off a urine cytology today

## 2023-05-03 ENCOUNTER — Encounter: Payer: Self-pay | Admitting: Orthopedic Surgery

## 2023-05-03 ENCOUNTER — Ambulatory Visit (INDEPENDENT_AMBULATORY_CARE_PROVIDER_SITE_OTHER): Payer: Medicare Other | Admitting: Orthopedic Surgery

## 2023-05-03 VITALS — BP 128/76 | Ht 69.6 in | Wt 191.0 lb

## 2023-05-03 DIAGNOSIS — M6530 Trigger finger, unspecified finger: Secondary | ICD-10-CM | POA: Diagnosis not present

## 2023-05-03 DIAGNOSIS — W19XXXD Unspecified fall, subsequent encounter: Secondary | ICD-10-CM | POA: Diagnosis not present

## 2023-05-03 DIAGNOSIS — S32030D Wedge compression fracture of third lumbar vertebra, subsequent encounter for fracture with routine healing: Secondary | ICD-10-CM | POA: Diagnosis not present

## 2023-05-03 DIAGNOSIS — M5136 Other intervertebral disc degeneration, lumbar region: Secondary | ICD-10-CM | POA: Diagnosis not present

## 2023-05-03 DIAGNOSIS — M47816 Spondylosis without myelopathy or radiculopathy, lumbar region: Secondary | ICD-10-CM | POA: Diagnosis not present

## 2023-05-03 NOTE — Patient Instructions (Signed)
It was so nice to see you today. Thank you so much for coming in.    Continue with physical therapy.   I want you to see ortho at the [facility] clinic for evaluation of your trigger fingers. They should call you to schedule an appointment or you can call them at [phone].   Please do not hesitate to call if you have any questions or concerns. You can also message me in MyChart.    Drake Leach PA-C [phone]

## 2023-05-04 DIAGNOSIS — M79645 Pain in left finger(s): Secondary | ICD-10-CM | POA: Diagnosis not present

## 2023-05-04 DIAGNOSIS — M79644 Pain in right finger(s): Secondary | ICD-10-CM | POA: Diagnosis not present

## 2023-05-09 DIAGNOSIS — M79644 Pain in right finger(s): Secondary | ICD-10-CM | POA: Diagnosis not present

## 2023-05-09 DIAGNOSIS — M79645 Pain in left finger(s): Secondary | ICD-10-CM | POA: Diagnosis not present

## 2023-05-10 DIAGNOSIS — M545 Low back pain, unspecified: Secondary | ICD-10-CM | POA: Diagnosis not present

## 2023-05-16 DIAGNOSIS — L821 Other seborrheic keratosis: Secondary | ICD-10-CM | POA: Diagnosis not present

## 2023-05-16 DIAGNOSIS — M79644 Pain in right finger(s): Secondary | ICD-10-CM | POA: Diagnosis not present

## 2023-05-16 DIAGNOSIS — L570 Actinic keratosis: Secondary | ICD-10-CM | POA: Diagnosis not present

## 2023-05-16 DIAGNOSIS — M79645 Pain in left finger(s): Secondary | ICD-10-CM | POA: Diagnosis not present

## 2023-05-16 DIAGNOSIS — D225 Melanocytic nevi of trunk: Secondary | ICD-10-CM | POA: Diagnosis not present

## 2023-05-17 ENCOUNTER — Other Ambulatory Visit: Payer: Self-pay | Admitting: Internal Medicine

## 2023-05-17 DIAGNOSIS — K219 Gastro-esophageal reflux disease without esophagitis: Secondary | ICD-10-CM

## 2023-05-18 ENCOUNTER — Encounter: Payer: Self-pay | Admitting: Internal Medicine

## 2023-05-18 ENCOUNTER — Ambulatory Visit (INDEPENDENT_AMBULATORY_CARE_PROVIDER_SITE_OTHER): Payer: Medicare Other | Admitting: Internal Medicine

## 2023-05-18 VITALS — BP 104/66 | HR 92 | Ht 69.6 in | Wt 193.0 lb

## 2023-05-18 DIAGNOSIS — E785 Hyperlipidemia, unspecified: Secondary | ICD-10-CM

## 2023-05-18 DIAGNOSIS — E118 Type 2 diabetes mellitus with unspecified complications: Secondary | ICD-10-CM

## 2023-05-18 DIAGNOSIS — E1169 Type 2 diabetes mellitus with other specified complication: Secondary | ICD-10-CM

## 2023-05-18 DIAGNOSIS — C61 Malignant neoplasm of prostate: Secondary | ICD-10-CM | POA: Diagnosis not present

## 2023-05-18 DIAGNOSIS — R350 Frequency of micturition: Secondary | ICD-10-CM | POA: Diagnosis not present

## 2023-05-18 DIAGNOSIS — Z7984 Long term (current) use of oral hypoglycemic drugs: Secondary | ICD-10-CM

## 2023-05-18 DIAGNOSIS — K219 Gastro-esophageal reflux disease without esophagitis: Secondary | ICD-10-CM | POA: Diagnosis not present

## 2023-05-18 LAB — POCT GLYCOSYLATED HEMOGLOBIN (HGB A1C): Hemoglobin A1C: 5.7 % — AB (ref 4.0–5.6)

## 2023-05-18 MED ORDER — OMEPRAZOLE 20 MG PO CPDR
20.0000 mg | DELAYED_RELEASE_CAPSULE | Freq: Every day | ORAL | 0 refills | Status: DC
Start: 2023-05-18 — End: 2023-11-09

## 2023-05-18 NOTE — Assessment & Plan Note (Deleted)
Blood sugars stable without hypoglycemic symptoms or events. Current regimen is metformin once daily. Changes made last visit are none. Lab Results  Component Value Date   HGBA1C 5.8 (A) 10/13/2022

## 2023-05-18 NOTE — Assessment & Plan Note (Signed)
Completed treatment as of three months ago. PSA < 0.01 He will continue to follow up with Urology

## 2023-05-18 NOTE — Assessment & Plan Note (Deleted)
LDL is  Lab Results  Component Value Date   LDLCALC 91 10/12/2021  Currently only on diet and lifestyle changes

## 2023-05-18 NOTE — Progress Notes (Deleted)
Date:  05/18/2023   Name:  Thomas Allen   DOB:  09-15-1940   MRN:  [id number]   Chief Complaint: No chief complaint on file.  Diabetes He presents for his follow-up diabetic visit. He has type 2 diabetes mellitus. Current diabetic treatment includes oral agent (monotherapy). He is compliant with treatment all of the time.    Lab Results  Component Value Date   NA 138 11/20/2022   K 4.3 11/20/2022   CO2 23 11/20/2022   GLUCOSE 133 (H) 11/20/2022   BUN 20 11/20/2022   CREATININE 0.91 11/20/2022   CALCIUM 9.8 11/20/2022   EGFR 85 11/20/2022   GFRNONAA >60 06/12/2022   Lab Results  Component Value Date   CHOL 166 10/12/2021   HDL 45 10/12/2021   LDLCALC 91 10/12/2021   TRIG 175 (H) 10/12/2021   CHOLHDL 3.7 10/12/2021   Lab Results  Component Value Date   TSH 1.620 11/20/2022   Lab Results  Component Value Date   HGBA1C 5.8 (A) 10/13/2022   Lab Results  Component Value Date   WBC 7.4 11/20/2022   HGB 12.9 (L) 11/20/2022   HCT 39.1 11/20/2022   MCV 97 11/20/2022   PLT 227 11/20/2022   Lab Results  Component Value Date   ALT 16 11/20/2022   AST 13 11/20/2022   ALKPHOS 88 11/20/2022   BILITOT 0.3 11/20/2022   No results found for: "25OHVITD2", "25OHVITD3", "VD25OH"   Review of Systems  Patient Active Problem List   Diagnosis Date Noted   Trigger little finger of right hand 04/10/2023   Diarrhea 11/14/2022   Status post laparoscopic hernia repair 02/02/2022   Low back strain, initial encounter 01/28/2021   Hand pain, right 08/23/2017   Erectile dysfunction following radiation therapy 06/26/2017   Urinary frequency 06/18/2017   Hyperlipidemia associated with type 2 diabetes mellitus (HCC) 03/12/2017   Asymptomatic PVCs 02/05/2017   Gastroesophageal reflux disease 02/05/2017   Alcohol use disorder 01/19/2016   Type II diabetes mellitus with complication (HCC) 07/26/2015   Carpal tunnel syndrome 03/30/2015   Essential hypertension 03/30/2015   Abnormal  LFTs 03/30/2015   H/O adenomatous polyp of colon 03/30/2015   Tobacco use disorder, moderate, in sustained remission 03/30/2015   Prostate cancer (HCC) 09/02/2013   Posterior calcaneal exostosis 02/26/2013   Calcific Achilles tendinitis 02/26/2013    Allergies  Allergen Reactions   Ace Inhibitors Cough    Past Surgical History:  Procedure Laterality Date   APPENDECTOMY     COLONOSCOPY  2008   benign polyps   COLONOSCOPY WITH PROPOFOL N/A 08/07/2017   Procedure: COLONOSCOPY WITH PROPOFOL;  Surgeon: Christena Deem, MD;  Location: [facility] ENDOSCOPY;  Service: Endoscopy;  Laterality: N/A;   COLONOSCOPY WITH PROPOFOL N/A 12/03/2020   Procedure: COLONOSCOPY WITH PROPOFOL;  Surgeon: Regis Bill, MD;  Location: [facility] ENDOSCOPY;  Service: Endoscopy;  Laterality: N/A;   ESOPHAGOGASTRODUODENOSCOPY (EGD) WITH PROPOFOL N/A 08/07/2017   Procedure: ESOPHAGOGASTRODUODENOSCOPY (EGD) WITH PROPOFOL;  Surgeon: Christena Deem, MD;  Location: [facility] ENDOSCOPY;  Service: Endoscopy;  Laterality: N/A;   INSERTION OF MESH Right 01/16/2022   Procedure: INSERTION OF MESH;  Surgeon: Campbell Lerner, MD;  Location: [facility] ORS;  Service: General;  Laterality: Right;   lithopexy     PROSTATE BIOPSY  2012   malignant   REPLACEMENT TOTAL KNEE Bilateral    [id number],[id number]    Social History   Tobacco Use   Smoking status: Former    Current packs/day: 0.00  Types: Cigarettes    Quit date: 65    Years since quitting: 48.7   Smokeless tobacco: Never  Vaping Use   Vaping status: Never Used  Substance Use Topics   Alcohol use: Yes    Alcohol/week: 14.0 standard drinks of alcohol    Types: 14 Shots of liquor per week    Comment: 2 scotch drinks per night   Drug use: No     Medication list has been reviewed and updated.  No outpatient medications have been marked as taking for the 05/18/23 encounter (Appointment) with Reubin Milan, MD.       11/14/2022    4:08 PM 05/18/2022   10:56 AM  01/09/2022    9:49 AM 10/12/2021    9:34 AM  GAD 7 : Generalized Anxiety Score  Nervous, Anxious, on Edge 0 0 0 0  Control/stop worrying 1 0 0 0  Worry too much - different things 1 0 0 0  Trouble relaxing 0 0 0 0  Restless 0 0 0 0  Easily annoyed or irritable 0 0 0 0  Afraid - awful might happen 0 0 0 0  Total GAD 7 Score 2 0 0 0  Anxiety Difficulty Not difficult at all Not difficult at all Not difficult at all        12/27/2022    1:31 PM 11/14/2022    4:08 PM 05/18/2022   10:56 AM  Depression screen PHQ 2/9  Decreased Interest 0 0 0  Down, Depressed, Hopeless 0 0 0  PHQ - 2 Score 0 0 0  Altered sleeping 0 3 1  Tired, decreased energy 0 3 0  Change in appetite 0 3 0  Feeling bad or failure about yourself  0 0 0  Trouble concentrating 0 0 0  Moving slowly or fidgety/restless 0 0 0  Suicidal thoughts 0 0 0  PHQ-9 Score 0 9 1  Difficult doing work/chores Not difficult at all Not difficult at all Not difficult at all    BP Readings from Last 3 Encounters:  05/03/23 128/76  05/02/23 122/74  04/23/23 134/74    Physical Exam  Wt Readings from Last 3 Encounters:  05/03/23 191 lb (86.6 kg)  05/02/23 191 lb 8 oz (86.9 kg)  04/23/23 184 lb (83.5 kg)    There were no vitals taken for this visit.  Assessment and Plan:  Problem List Items Addressed This Visit   None   No follow-ups on file.    Reubin Milan, MD [facility] [place]

## 2023-05-18 NOTE — Progress Notes (Signed)
Date:  05/18/2023   Name:  Thomas Allen   DOB:  06-24-1941   MRN:  [id number]   Chief Complaint: Diabetes  Diabetes He presents for his follow-up diabetic visit. He has type 2 diabetes mellitus. Pertinent negatives for hypoglycemia include no headaches or tremors. Pertinent negatives for diabetes include no chest pain, no fatigue, no polydipsia and no polyuria. Current diabetic treatment includes oral agent (monotherapy). He is compliant with treatment all of the time.  Gastroesophageal Reflux He complains of heartburn. He reports no abdominal pain, no chest pain, no coughing or no wheezing. The problem occurs rarely. Pertinent negatives include no fatigue. He has tried a PPI for the symptoms.  Prostate cancer - recently stopped treatment and PSA being repeated next week.  He feels well.  Bladder symptoms improved on [place] and Uro-MP.  Lab Results  Component Value Date   NA 138 11/20/2022   K 4.3 11/20/2022   CO2 23 11/20/2022   GLUCOSE 133 (H) 11/20/2022   BUN 20 11/20/2022   CREATININE 0.91 11/20/2022   CALCIUM 9.8 11/20/2022   EGFR 85 11/20/2022   GFRNONAA >60 06/12/2022   Lab Results  Component Value Date   CHOL 166 10/12/2021   HDL 45 10/12/2021   LDLCALC 91 10/12/2021   TRIG 175 (H) 10/12/2021   CHOLHDL 3.7 10/12/2021   Lab Results  Component Value Date   TSH 1.620 11/20/2022   Lab Results  Component Value Date   HGBA1C 5.7 (A) 05/18/2023   Lab Results  Component Value Date   WBC 7.4 11/20/2022   HGB 12.9 (L) 11/20/2022   HCT 39.1 11/20/2022   MCV 97 11/20/2022   PLT 227 11/20/2022   Lab Results  Component Value Date   ALT 16 11/20/2022   AST 13 11/20/2022   ALKPHOS 88 11/20/2022   BILITOT 0.3 11/20/2022   No results found for: "25OHVITD2", "25OHVITD3", "VD25OH"   Review of Systems  Constitutional:  Negative for appetite change, fatigue and unexpected weight change.  Eyes:  Negative for visual disturbance.  Respiratory:  Negative for cough,  shortness of breath and wheezing.   Cardiovascular:  Negative for chest pain, palpitations and leg swelling.  Gastrointestinal:  Positive for heartburn. Negative for abdominal pain and blood in stool.  Endocrine: Negative for polydipsia and polyuria.  Genitourinary:  Negative for dysuria and hematuria.  Skin:  Negative for color change and rash.  Neurological:  Negative for tremors, numbness and headaches.  Psychiatric/Behavioral:  Negative for dysphoric mood.     Patient Active Problem List   Diagnosis Date Noted   Trigger little finger of right hand 04/10/2023   Diarrhea 11/14/2022   Status post laparoscopic hernia repair 02/02/2022   Low back strain, initial encounter 01/28/2021   Hand pain, right 08/23/2017   Erectile dysfunction following radiation therapy 06/26/2017   Urinary frequency 06/18/2017   Hyperlipidemia associated with type 2 diabetes mellitus (HCC) 03/12/2017   Asymptomatic PVCs 02/05/2017   Gastroesophageal reflux disease 02/05/2017   Alcohol use disorder 01/19/2016   Type II diabetes mellitus with complication (HCC) 07/26/2015   Carpal tunnel syndrome 03/30/2015   Essential hypertension 03/30/2015   Abnormal LFTs 03/30/2015   H/O adenomatous polyp of colon 03/30/2015   Tobacco use disorder, moderate, in sustained remission 03/30/2015   Prostate cancer (HCC) 09/02/2013   Posterior calcaneal exostosis 02/26/2013   Calcific Achilles tendinitis 02/26/2013    Allergies  Allergen Reactions   Ace Inhibitors Cough    Past Surgical History:  Procedure Laterality Date   APPENDECTOMY     COLONOSCOPY  2008   benign polyps   COLONOSCOPY WITH PROPOFOL N/A 08/07/2017   Procedure: COLONOSCOPY WITH PROPOFOL;  Surgeon: Christena Deem, MD;  Location: [facility] ENDOSCOPY;  Service: Endoscopy;  Laterality: N/A;   COLONOSCOPY WITH PROPOFOL N/A 12/03/2020   Procedure: COLONOSCOPY WITH PROPOFOL;  Surgeon: Regis Bill, MD;  Location: [facility] ENDOSCOPY;  Service:  Endoscopy;  Laterality: N/A;   ESOPHAGOGASTRODUODENOSCOPY (EGD) WITH PROPOFOL N/A 08/07/2017   Procedure: ESOPHAGOGASTRODUODENOSCOPY (EGD) WITH PROPOFOL;  Surgeon: Christena Deem, MD;  Location: [facility] ENDOSCOPY;  Service: Endoscopy;  Laterality: N/A;   INSERTION OF MESH Right 01/16/2022   Procedure: INSERTION OF MESH;  Surgeon: Campbell Lerner, MD;  Location: [facility] ORS;  Service: General;  Laterality: Right;   lithopexy     PROSTATE BIOPSY  2012   malignant   REPLACEMENT TOTAL KNEE Bilateral    2009,2015    Social History   Tobacco Use   Smoking status: Former    Current packs/day: 0.00    Types: Cigarettes    Quit date: 1976    Years since quitting: 48.7   Smokeless tobacco: Never  Vaping Use   Vaping status: Never Used  Substance Use Topics   Alcohol use: Yes    Alcohol/week: 14.0 standard drinks of alcohol    Types: 14 Shots of liquor per week    Comment: 2 scotch drinks per night   Drug use: No     Medication list has been reviewed and updated.  Current Meds  Medication Sig   calcium carbonate (OS-CAL - DOSED IN MG OF ELEMENTAL CALCIUM) 1250 (500 Ca) MG tablet Take 1 tablet by mouth.   loperamide (IMODIUM A-D) 2 MG tablet Take 2 mg by mouth 4 (four) times daily as needed for diarrhea or loose stools.   metFORMIN (GLUCOPHAGE) 1000 MG tablet Take 1 tablet (1,000 mg total) by mouth daily with breakfast.   Meth-Hyo-M Bl-Na Phos-Ph Sal (URIBEL) 118 MG CAPS Take 1 capsule (118 mg total) by mouth 4 (four) times daily as needed. (Patient taking differently: Take 1 capsule by mouth 4 (four) times daily as needed. Takes one daily)   omeprazole (PRILOSEC) 20 MG capsule Take 1 capsule (20 mg total) by mouth daily.   trospium (SANCTURA) 20 MG tablet Take 1 tablet (20 mg total) by mouth every 12 (twelve) hours as needed.   [DISCONTINUED] omeprazole (PRILOSEC) 20 MG capsule TAKE 1 CAPSULE BY MOUTH EVERY DAY       05/18/2023    3:33 PM 11/14/2022    4:08 PM 05/18/2022   10:56 AM  01/09/2022    9:49 AM  GAD 7 : Generalized Anxiety Score  Nervous, Anxious, on Edge 0 0 0 0  Control/stop worrying 0 1 0 0  Worry too much - different things 0 1 0 0  Trouble relaxing 0 0 0 0  Restless 0 0 0 0  Easily annoyed or irritable 0 0 0 0  Afraid - awful might happen 0 0 0 0  Total GAD 7 Score 0 2 0 0  Anxiety Difficulty Not difficult at all Not difficult at all Not difficult at all Not difficult at all       05/18/2023    3:33 PM 12/27/2022    1:31 PM 11/14/2022    4:08 PM  Depression screen PHQ 2/9  Decreased Interest 0 0 0  Down, Depressed, Hopeless 0 0 0  PHQ - 2 Score 0 0  0  Altered sleeping 0 0 3  Tired, decreased energy 0 0 3  Change in appetite 0 0 3  Feeling bad or failure about yourself  0 0 0  Trouble concentrating 0 0 0  Moving slowly or fidgety/restless 0 0 0  Suicidal thoughts 0 0 0  PHQ-9 Score 0 0 9  Difficult doing work/chores Not difficult at all Not difficult at all Not difficult at all    BP Readings from Last 3 Encounters:  05/18/23 104/66  05/03/23 128/76  05/02/23 122/74    Physical Exam Vitals and nursing note reviewed.  Constitutional:      General: He is not in acute distress.    Appearance: Normal appearance. He is well-developed.  HENT:     Head: Normocephalic and atraumatic.  Neck:     Vascular: No carotid bruit.  Cardiovascular:     Rate and Rhythm: Normal rate and regular rhythm.  Pulmonary:     Effort: Pulmonary effort is normal. No respiratory distress.     Breath sounds: No wheezing or rhonchi.  Musculoskeletal:     Cervical back: Normal range of motion.     Right lower leg: No edema.     Left lower leg: No edema.  Skin:    General: Skin is warm and dry.     Findings: No rash.  Neurological:     General: No focal deficit present.     Mental Status: He is alert and oriented to person, place, and time.  Psychiatric:        Mood and Affect: Mood normal.        Behavior: Behavior normal.     Wt Readings from Last 3  Encounters:  05/18/23 193 lb (87.5 kg)  05/03/23 191 lb (86.6 kg)  05/02/23 191 lb 8 oz (86.9 kg)    BP 104/66   Pulse 92   Ht 5' 9.6" (1.768 m)   Wt 193 lb (87.5 kg)   SpO2 96%   BMI 28.01 kg/m   Assessment and Plan:  Problem List Items Addressed This Visit       Unprioritized   Urinary frequency    Seeing Urology Symptoms improved on [place] and the recent addition Uro MP      Type II diabetes mellitus with complication (HCC) - Primary (Chronic)    Blood sugars stable without hypoglycemic symptoms or events. Current regimen is metformin. Changes made last visit are none. Lab Results  Component Value Date   HGBA1C 5.8 (A) 10/13/2022  A1C today = 5.7       Relevant Orders   POCT glycosylated hemoglobin (Hb A1C) (Completed)   Prostate cancer (HCC) (Chronic)    Completed treatment as of three months ago. PSA < 0.01 He will continue to follow up with Urology      Hyperlipidemia associated with type 2 diabetes mellitus (HCC) (Chronic)   Gastroesophageal reflux disease (Chronic)   Relevant Medications   omeprazole (PRILOSEC) 20 MG capsule   Other Visit Diagnoses     Long term current use of oral hypoglycemic drug           Return in about 6 months (around 11/15/2023) for HTN, DM, Medicare annual.    Reubin Milan, MD [facility] [place]

## 2023-05-18 NOTE — Assessment & Plan Note (Signed)
Seeing Urology Symptoms improved on [place] and the recent addition Uro MP

## 2023-05-18 NOTE — Assessment & Plan Note (Addendum)
Blood sugars stable without hypoglycemic symptoms or events. Current regimen is metformin. Changes made last visit are none. Lab Results  Component Value Date   HGBA1C 5.8 (A) 10/13/2022  A1C today = 5.7

## 2023-05-18 NOTE — Assessment & Plan Note (Deleted)
Blood sugars stable without hypoglycemic symptoms or events. Current regimen is metformin once a day. Changes made last visit are none. Lab Results  Component Value Date   HGBA1C 5.8 (A) 10/13/2022

## 2023-05-20 ENCOUNTER — Other Ambulatory Visit: Payer: Self-pay | Admitting: Physician Assistant

## 2023-05-20 DIAGNOSIS — R350 Frequency of micturition: Secondary | ICD-10-CM

## 2023-05-23 DIAGNOSIS — M79645 Pain in left finger(s): Secondary | ICD-10-CM | POA: Diagnosis not present

## 2023-05-23 DIAGNOSIS — M79644 Pain in right finger(s): Secondary | ICD-10-CM | POA: Diagnosis not present

## 2023-05-24 DIAGNOSIS — C772 Secondary and unspecified malignant neoplasm of intra-abdominal lymph nodes: Secondary | ICD-10-CM | POA: Diagnosis not present

## 2023-05-24 DIAGNOSIS — C61 Malignant neoplasm of prostate: Secondary | ICD-10-CM | POA: Diagnosis not present

## 2023-05-25 DIAGNOSIS — M545 Low back pain, unspecified: Secondary | ICD-10-CM | POA: Diagnosis not present

## 2023-06-05 ENCOUNTER — Ambulatory Visit: Payer: Medicare Other | Admitting: Urology

## 2023-06-18 DIAGNOSIS — M79644 Pain in right finger(s): Secondary | ICD-10-CM | POA: Diagnosis not present

## 2023-06-18 DIAGNOSIS — M79645 Pain in left finger(s): Secondary | ICD-10-CM | POA: Diagnosis not present

## 2023-07-02 DIAGNOSIS — H2513 Age-related nuclear cataract, bilateral: Secondary | ICD-10-CM | POA: Diagnosis not present

## 2023-07-02 DIAGNOSIS — H40003 Preglaucoma, unspecified, bilateral: Secondary | ICD-10-CM | POA: Diagnosis not present

## 2023-07-02 DIAGNOSIS — E119 Type 2 diabetes mellitus without complications: Secondary | ICD-10-CM | POA: Diagnosis not present

## 2023-07-02 LAB — HM DIABETES EYE EXAM

## 2023-07-16 ENCOUNTER — Other Ambulatory Visit: Payer: Self-pay

## 2023-07-16 ENCOUNTER — Emergency Department: Payer: Medicare Other

## 2023-07-16 ENCOUNTER — Emergency Department
Admission: EM | Admit: 2023-07-16 | Discharge: 2023-07-16 | Disposition: A | Discharge status: Home / Self Care | Payer: Medicare Other | Attending: Student in an Organized Health Care Education/Training Program | Admitting: Student in an Organized Health Care Education/Training Program

## 2023-07-16 DIAGNOSIS — I672 Cerebral atherosclerosis: Secondary | ICD-10-CM | POA: Diagnosis not present

## 2023-07-16 DIAGNOSIS — W010XXA Fall on same level from slipping, tripping and stumbling without subsequent striking against object, initial encounter: Secondary | ICD-10-CM | POA: Diagnosis not present

## 2023-07-16 DIAGNOSIS — S199XXA Unspecified injury of neck, initial encounter: Secondary | ICD-10-CM | POA: Diagnosis not present

## 2023-07-16 DIAGNOSIS — R000 Tachycardia, unspecified: Secondary | ICD-10-CM | POA: Diagnosis not present

## 2023-07-16 DIAGNOSIS — M542 Cervicalgia: Secondary | ICD-10-CM | POA: Insufficient documentation

## 2023-07-16 DIAGNOSIS — M549 Dorsalgia, unspecified: Secondary | ICD-10-CM | POA: Diagnosis not present

## 2023-07-16 DIAGNOSIS — R519 Headache, unspecified: Secondary | ICD-10-CM | POA: Diagnosis not present

## 2023-07-16 DIAGNOSIS — M25511 Pain in right shoulder: Secondary | ICD-10-CM | POA: Insufficient documentation

## 2023-07-16 DIAGNOSIS — Z043 Encounter for examination and observation following other accident: Secondary | ICD-10-CM | POA: Diagnosis not present

## 2023-07-16 DIAGNOSIS — W19XXXA Unspecified fall, initial encounter: Secondary | ICD-10-CM

## 2023-07-16 DIAGNOSIS — R0989 Other specified symptoms and signs involving the circulatory and respiratory systems: Secondary | ICD-10-CM | POA: Diagnosis not present

## 2023-07-16 DIAGNOSIS — I10 Essential (primary) hypertension: Secondary | ICD-10-CM | POA: Diagnosis not present

## 2023-07-16 DIAGNOSIS — S0990XA Unspecified injury of head, initial encounter: Secondary | ICD-10-CM | POA: Diagnosis not present

## 2023-07-16 LAB — COMPREHENSIVE METABOLIC PANEL
ALT: 19 U/L (ref 0–44)
AST: 23 U/L (ref 15–41)
Albumin: 4.2 g/dL (ref 3.5–5.0)
Alkaline Phosphatase: 65 U/L (ref 38–126)
Anion gap: 8 (ref 5–15)
BUN: 25 mg/dL — ABNORMAL HIGH (ref 8–23)
CO2: 25 mmol/L (ref 22–32)
Calcium: 9.2 mg/dL (ref 8.9–10.3)
Chloride: 103 mmol/L (ref 98–111)
Creatinine, Ser: 1.01 mg/dL (ref 0.61–1.24)
GFR, Estimated: >60 mL/min (ref >60)
Glucose, Bld: 149 mg/dL — ABNORMAL HIGH (ref 70–99)
Potassium: 4.1 mmol/L (ref 3.5–5.1)
Sodium: 136 mmol/L (ref 135–145)
Total Bilirubin: 0.7 mg/dL (ref <1.2)
Total Protein: 7.7 g/dL (ref 6.5–8.1)

## 2023-07-16 LAB — CBC
HCT: 41 % (ref 39.0–52.0)
Hemoglobin: 13.9 g/dL (ref 13.0–17.0)
MCH: 32.1 pg (ref 26.0–34.0)
MCHC: 33.9 g/dL (ref 30.0–36.0)
MCV: 94.7 fL (ref 80.0–100.0)
Platelets: 230 10*3/uL (ref 150–400)
RBC: 4.33 MIL/uL (ref 4.22–5.81)
RDW: 12.5 % (ref 11.5–15.5)
WBC: 10.2 10*3/uL (ref 4.0–10.5)
nRBC: 0 % (ref 0.0–0.2)

## 2023-07-16 LAB — CK: Total CK: 179 U/L (ref 49–397)

## 2023-07-16 NOTE — ED Triage Notes (Signed)
Pt presents to ED via [facility] from home with c/o of mechanical fall after tripping over a "toy". Pt states his wife found him at 0230 on the floor but pt refused 911 at that time due to "thinking I could get up". Pt presents covered in urine, pt cleaned up, urine saturated pants place din pt belonging bag.   Pt c/o of neck pain, c-collar, in place, pt states HX of "hairline FX to neck int he past". Pt denies LOC at this time. Pt is A&Ox4. Pt also c/o of R shoulder pain, no obvious deformity noted to this shoulder.

## 2023-07-16 NOTE — ED Notes (Signed)
D/C and reasons to return to ED discussed with pt, pt verbalized understanding. Pt given and helped dressed into blue scrubs due to urine saturated clothes. Skid sock applied as well. Pt placed in lobby in wheelchair, pt awaiting a ride from family, first nurse aware.

## 2023-07-16 NOTE — ED Notes (Signed)
Pt stated they had an accident. This EDT cleaned pt up and provided pt with a new chuck and brief. Pt has no further needs at this time. Call bell is in pt's reach.

## 2023-07-16 NOTE — ED Provider Notes (Signed)
[facility] Provider Note    Event Date/Time   First MD Initiated Contact with Patient 07/16/23 [phone]     (approximate)   History   Fall and Neck Pain   HPI  Thomas Allen is a 82 y.o. male presents the ER for evaluation of head neck pain right shoulder pain after mechanical fall last night around 2 AM.  Laid on tile floor for several hours.  States he tripped over a toy fire truck.  Denies any LOC but does not recall falling to the ground.  Denies any chest pain or shortness of breath not any blood thinners.     Physical Exam   Triage Vital Signs: ED Triage Vitals  Encounter Vitals Group     BP      Systolic BP Percentile      Diastolic BP Percentile      Pulse      Resp      Temp      Temp src      SpO2      Weight      Height      Head Circumference      Peak Flow      Pain Score      Pain Loc      Pain Education      Exclude from Growth Chart     Most recent vital signs: Vitals:   07/16/23 0903 07/16/23 0905  BP:  (!) 172/82  Pulse: 98   Resp: 19   Temp: 98.2 F (36.8 C)   SpO2: 95%      Constitutional: Alert  Eyes: Conjunctivae are normal.  Head: Atraumatic. Nose: No congestion/rhinnorhea. Mouth/Throat: Mucous membranes are moist.   Neck: Painless ROM.  Cardiovascular:   Good peripheral circulation. Respiratory: Normal respiratory effort.  No retractions.  Gastrointestinal: Soft and nontender.  Musculoskeletal:  no deformity Neurologic:  MAE spontaneously. No gross focal neurologic deficits are appreciated.  Skin:  Skin is warm, dry and intact. No rash noted. Psychiatric: Mood and affect are normal. Speech and behavior are normal.    ED Results / Procedures / Treatments   Labs (all labs ordered are listed, but only abnormal results are displayed) Labs Reviewed  COMPREHENSIVE METABOLIC PANEL - Abnormal; Notable for the following components:      Result Value   Glucose, Bld 149 (*)    BUN 25 (*)    All other  components within normal limits  CBC  CK     EKG  ED ECG REPORT I, Willy Eddy, the attending physician, personally viewed and interpreted this ECG.   Date: 07/16/2023  EKG Time: 9:02  Rate: 100  Rhythm: sinus  Axis: normal  Intervals: normal  ST&T Change: no stemi, no depressions    RADIOLOGY Please see ED Course for my review and interpretation.  I personally reviewed all radiographic images ordered to evaluate for the above acute complaints and reviewed radiology reports and findings.  These findings were personally discussed with the patient.  Please see medical record for radiology report.    PROCEDURES:  Critical Care performed: No  Procedures   MEDICATIONS ORDERED IN ED: Medications - No data to display   IMPRESSION / MDM / ASSESSMENT AND PLAN / ED COURSE  I reviewed the triage vital signs and the nursing notes.  Differential diagnosis includes, but is not limited to, fracture, contusion, SDH, IPH, concussion, electrolyte abnormality, rhabdo  Patient presenting to the ER for evaluation of symptoms as described above.  Based on symptoms, risk factors and considered above differential, this presenting complaint could reflect a potentially life-threatening illness therefore the patient will be placed on continuous pulse oximetry and telemetry for monitoring.  Laboratory evaluation will be sent to evaluate for the above complaints.  Patient declining anything for pain.  Will order CT head and neck given fall.  X-rays ordered for the right shoulder pain and upper back pain.  Presentation consistent with symptoms secondary to injuries from mechanical fall.  Clinical Course as of 07/16/23 1136  Mon Jul 16, 2023  1005 CT head on my review and interpretation without evidence of hematoma or IPH. [PR]  1135 CT imaging reassuring.  Patient feels improved.  Cervical collar removed and C-spine cleared after negative imaging.  Repeat exam  without any leg pain arm pain abdominal pain.  He does appear appropriate for outpatient follow-up. [PR]    Clinical Course User Index [PR] Willy Eddy, MD     FINAL CLINICAL IMPRESSION(S) / ED DIAGNOSES   Final diagnoses:  None     Rx / DC Orders   ED Discharge Orders     None        Note:  This document was prepared using Dragon voice recognition software and may include unintentional dictation errors.    Willy Eddy, MD 07/16/23 1136

## 2023-07-20 ENCOUNTER — Telehealth: Payer: Self-pay | Admitting: Orthopedic Surgery

## 2023-07-20 NOTE — Telephone Encounter (Signed)
Spoke with patient and his wife.   Had fall Monday and went to ED. CT head/cspine were okay. Xrays right shoulder negative for fracture per report.    Had another fall on Wednesday. Did not hit head. Right shoulder pain is still bad but has increased pain in right clavicle region. Cannot lift right arm due to pain.   Recommend he to back to ED or UC for further imaging of right shoulder/clavicle since pain is worse. Discussed he likely will need f/u with ortho as well.   He will call [facility] to see if he can be seen by UC. He will try to call ortho at [facility] as well.   He was advised to make f/u with PCP as well since he's had 2 falls in a week.   Advised he should not be driving since he has limited use of right arm.

## 2023-07-20 NOTE — Telephone Encounter (Signed)
Patient's wife, Tamela Oddi is calling to let our office know that the patient fell on Monday of this week and was transported to the hospital by EMS and was told he had a severe shoulder sprain. She states that he fell again Wednesday and his shoulder is bothering him a lot more now. She is concerned with the patient having fallen twice in the last week and is requesting a call back from [place].

## 2023-07-26 ENCOUNTER — Other Ambulatory Visit: Payer: Self-pay | Admitting: Internal Medicine

## 2023-07-26 DIAGNOSIS — E118 Type 2 diabetes mellitus with unspecified complications: Secondary | ICD-10-CM

## 2023-07-27 NOTE — Progress Notes (Unsigned)
Referring Physician:  Reubin Milan, MD [address] [place],  [place] [address]  Primary Physician:  Reubin Milan, MD  History of Present Illness: 07/27/2023 Thomas Allen has a history of DM, hyperlipidemia, GERD, HTN, and prostate CA.   I have seen him in the past for a C5 fracture s/p fall on 06/12/22.   Last seen by me on 05/03/23 for LBP. He has known lumbar spondylosis and DDD along with remote L3 compression fracture.   He was to continue with PT at [facility].   He called around 07/20/23 with 2 recent falls and pain in his shoulder/right clavicle region. I advised he f/u with UC/ED or ortho for this.   He is here for follow up.        He is doing better. He still has some intermittent back pain when he ties his shoes, but overall, it is much improved. No leg pain. No numbness, tingling, or weakness in his legs.    Conservative measures:  Physical therapy: has done 7 visits at [facility] and is still going Multimodal medical therapy including regular antiinflammatories: none Injections: has not received epidural steroid injections  Past Surgery: no previous spine surgery  Thomas Allen has no symptoms of cervical myelopathy.  The symptoms are causing a significant impact on the patient's life.   Review of Systems:  A 10 point review of systems is negative, except for the pertinent positives and negatives detailed in the HPI.  Past Medical History: Past Medical History:  Diagnosis Date   Arthritis    Cancer (HCC)    Diabetes mellitus without complication (HCC)    GERD (gastroesophageal reflux disease)    Haglund's deformity    History of kidney stones    Hyperlipidemia    Hypertension    Intractable hiccups 02/05/2017   Prostate CA (HCC)    Sleep apnea     Past Surgical History: Past Surgical History:  Procedure Laterality Date   APPENDECTOMY     COLONOSCOPY  2008   benign polyps   COLONOSCOPY WITH PROPOFOL N/A 08/07/2017   Procedure:  COLONOSCOPY WITH PROPOFOL;  Surgeon: Christena Deem, MD;  Location: [facility] ENDOSCOPY;  Service: Endoscopy;  Laterality: N/A;   COLONOSCOPY WITH PROPOFOL N/A 12/03/2020   Procedure: COLONOSCOPY WITH PROPOFOL;  Surgeon: Regis Bill, MD;  Location: [facility] ENDOSCOPY;  Service: Endoscopy;  Laterality: N/A;   ESOPHAGOGASTRODUODENOSCOPY (EGD) WITH PROPOFOL N/A 08/07/2017   Procedure: ESOPHAGOGASTRODUODENOSCOPY (EGD) WITH PROPOFOL;  Surgeon: Christena Deem, MD;  [facility]COPY;  Service: Endoscopy;  Laterality: N/A;   INSERTION OF MESH Right 01/16/2022   Procedure: INSERTION OF MESH;  Surgeon: Campbell Lerner, MD;  Location: [facility] ORS;  Service: General;  Laterality: Right;   lithopexy     PROSTATE BIOPSY  2012   malignant   REPLACEMENT TOTAL KNEE Bilateral    [id number]    Allergies: Allergies as of 08/06/2023 - Review Complete 07/16/2023  Allergen Reaction Noted   Ace inhibitors Cough 01/19/2016    Medications: Outpatient Encounter Medications as of 08/06/2023  Medication Sig   calcium carbonate (OS-CAL - DOSED IN MG OF ELEMENTAL CALCIUM) 1250 (500 Ca) MG tablet Take 1 tablet by mouth.   loperamide (IMODIUM A-D) 2 MG tablet Take 2 mg by mouth 4 (four) times daily as needed for diarrhea or loose stools.   metFORMIN (GLUCOPHAGE) 1000 MG tablet TAKE 1 TABLET BY MOUTH EVERY DAY WITH BREAKFAST   Meth-Hyo-M Bl-Na Phos-Ph Sal (URIBEL) 118  MG CAPS Take 1 capsule (118 mg total) by mouth 4 (four) times daily as needed. (Patient taking differently: Take 1 capsule by mouth 4 (four) times daily as needed. Takes one daily)   omeprazole (PRILOSEC) 20 MG capsule Take 1 capsule (20 mg total) by mouth daily.   No facility-administered encounter medications on file as of 08/06/2023.    Social History: Social History   Tobacco Use   Smoking status: Former    Current packs/day: 0.00    Types: Cigarettes    Quit date: 1976    Years since quitting: 48.9   Smokeless tobacco: Never   Vaping Use   Vaping status: Never Used  Substance Use Topics   Alcohol use: Yes    Alcohol/week: 14.0 standard drinks of alcohol    Types: 14 Shots of liquor per week    Comment: 2 scotch drinks per night   Drug use: No    Family Medical History: Family History  Problem Relation Age of Onset   Diabetes Mother    Colon cancer Mother    Colon cancer Father    Prostate cancer Neg Hx    Bladder Cancer Neg Hx    Kidney cancer Neg Hx     Physical Examination: There were no vitals filed for this visit.   Awake, alert, oriented to person, place, and time.  Speech is clear and fluent. Fund of knowledge is appropriate.   Cranial Nerves: Pupils equal round and reactive to light.  Facial tone is symmetric.    He has no lower lumbar tenderness.   No abnormal lesions on exposed skin.   Strength:  Side Iliopsoas Quads Hamstring PF DF EHL  R 5 5 5 5 5 5   L 5 5 5 5 5 5     Bilateral lower extremity sensation is intact to light touch.     Gait is normal.    Medical Decision Making  Imaging: Nothing new.    Assessment and Plan: Thomas Allen is a pleasant 82 y.o. male with intermittent LBP that is improving with PT. 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 No leg pain. No numbness, tingling, or weakness.   He has known lumbar spondylosis and DDD along with remote L3 compression fracture.   Treatment options discussed with patient and following plan made:   - Continue with PT for lumbar spine.  - Form filled out for handicapped placard- temporary for 6 months.  - Consider lumbar injections if pain gets worse/more frequent.  - He will f/u with me in 3 months.   Referral to ortho at [facility] for trigger finger- right small and left middle finger.   I spent a total of 15 minutes in face-to-face and non-face-to-face activities related to this patient's care today including review of outside records, review of imaging, review of symptoms, physical exam, discussion of differential  diagnosis, discussion of treatment options, and documentation.   Drake Leach PA-C Dept. of Neurosurgery

## 2023-07-27 NOTE — Telephone Encounter (Signed)
Requested Prescriptions  Pending Prescriptions Disp Refills   metFORMIN (GLUCOPHAGE) 1000 MG tablet [Pharmacy Med Name: METFORMIN HCL 1,000 MG TABLET] 90 tablet 1    Sig: TAKE 1 TABLET BY MOUTH EVERY DAY WITH BREAKFAST     Endocrinology:  Diabetes - Biguanides Failed - 07/26/2023  1:35 AM      Failed - B12 Level in normal range and within 720 days    Vitamin B-12  Date Value Ref Range Status  01/09/2022 >2000 (H) 232 - 1245 pg/mL Final         Passed - Cr in normal range and within 360 days    Creatinine  Date Value Ref Range Status  08/24/2013 0.76 0.60 - 1.30 mg/dL Final   Creatinine, Ser  Date Value Ref Range Status  07/16/2023 1.01 0.61 - 1.24 mg/dL Final         Passed - HBA1C is between 0 and 7.9 and within 180 days    Hemoglobin A1C  Date Value Ref Range Status  05/18/2023 5.7 (A) 4.0 - 5.6 % Final  01/19/2021 5.9  Final         Passed - eGFR in normal range and within 360 days    EGFR (African American)  Date Value Ref Range Status  08/24/2013 >60  Final   GFR calc Af Amer  Date Value Ref Range Status  08/16/2020 84 >59 mL/min/1.73 Final    Comment:    **In accordance with recommendations from the NKF-ASN Task force,**   [facility] is in the process of updating its eGFR calculation to the   2021 CKD-EPI creatinine equation that estimates kidney function   without a race variable.    EGFR (Non-African Amer.)  Date Value Ref Range Status  08/24/2013 >60  Final    Comment:    eGFR values <61mL/min/1.73 m2 may be an indication of chronic kidney disease (CKD). Calculated eGFR is useful in patients with stable renal function. The eGFR calculation will not be reliable in acutely ill patients when serum creatinine is changing rapidly. It is not useful in  patients on dialysis. The eGFR calculation may not be applicable to patients at the low and high extremes of body sizes, pregnant women, and vegetarians. POTASSIUM - Slight hemolysis, interpret results with  -  caution.    GFR, Estimated  Date Value Ref Range Status  07/16/2023 >60 >60 mL/min Final    Comment:    (NOTE) Calculated using the CKD-EPI Creatinine Equation (2021)    eGFR  Date Value Ref Range Status  11/20/2022 85 >59 mL/min/1.73 Final         Passed - Valid encounter within last 6 months    Recent Outpatient Visits           2 months ago Type II diabetes mellitus with complication [facility])   [place] [facility] at [facility], Nyoka Cowden, MD   3 months ago Acute cystitis with hematuria   [facility] at [facility], Nyoka Cowden, MD   8 months ago Diarrhea, unspecified type   [facility] at [facility], Nyoka Cowden, MD   9 months ago Type II diabetes mellitus with complication [facility])   [place] [facility] at [facility], Nyoka Cowden, MD   1 year ago Acute cystitis with hematuria   [facility] at [facility], Nyoka Cowden,  MD       Future Appointments             In 3 months Judithann Graves, Nyoka Cowden, MD [facility] Primary [facility] Medicine at [facility]            Passed - CBC within normal limits and completed in the last 12 months    WBC  Date Value Ref Range Status  07/16/2023 10.2 4.0 - 10.5 K/uL Final   RBC  Date Value Ref Range Status  07/16/2023 4.33 4.22 - 5.81 MIL/uL Final   Hemoglobin  Date Value Ref Range Status  07/16/2023 13.9 13.0 - 17.0 g/dL Final  19/14/7829 56.2 (L) 13.0 - 17.7 g/dL Final   HCT  Date Value Ref Range Status  07/16/2023 41.0 39.0 - 52.0 % Final   Hematocrit  Date Value Ref Range Status  11/20/2022 39.1 37.5 - 51.0 % Final   MCHC  Date Value Ref Range Status  07/16/2023 33.9 30.0 - 36.0 g/dL Final   [facility]  Date Value Ref Range Status  07/16/2023 32.1 26.0 - 34.0 pg Final   MCV  Date Value Ref Range Status  07/16/2023  94.7 80.0 - 100.0 fL Final  11/20/2022 97 79 - 97 fL Final  08/24/2013 98 80 - 100 fL Final   No results found for: "PLTCOUNTKUC", "LABPLAT", "POCPLA" RDW  Date Value Ref Range Status  07/16/2023 12.5 11.5 - 15.5 % Final  11/20/2022 12.6 11.6 - 15.4 % Final  08/24/2013 12.9 11.5 - 14.5 % Final

## 2023-08-06 ENCOUNTER — Ambulatory Visit: Payer: Medicare Other | Admitting: Orthopedic Surgery

## 2023-10-22 ENCOUNTER — Encounter: Payer: Self-pay | Admitting: Internal Medicine

## 2023-10-22 ENCOUNTER — Ambulatory Visit (INDEPENDENT_AMBULATORY_CARE_PROVIDER_SITE_OTHER): Payer: Medicare Other | Admitting: Internal Medicine

## 2023-10-22 VITALS — BP 124/74 | HR 80 | Ht 68.0 in | Wt 196.0 lb

## 2023-10-22 DIAGNOSIS — E785 Hyperlipidemia, unspecified: Secondary | ICD-10-CM | POA: Diagnosis not present

## 2023-10-22 DIAGNOSIS — G3184 Mild cognitive impairment, so stated: Secondary | ICD-10-CM | POA: Diagnosis not present

## 2023-10-22 DIAGNOSIS — E1169 Type 2 diabetes mellitus with other specified complication: Secondary | ICD-10-CM

## 2023-10-22 DIAGNOSIS — E118 Type 2 diabetes mellitus with unspecified complications: Secondary | ICD-10-CM | POA: Diagnosis not present

## 2023-10-22 DIAGNOSIS — Z7984 Long term (current) use of oral hypoglycemic drugs: Secondary | ICD-10-CM

## 2023-10-22 DIAGNOSIS — C61 Malignant neoplasm of prostate: Secondary | ICD-10-CM | POA: Diagnosis not present

## 2023-10-22 NOTE — Assessment & Plan Note (Signed)
Currently managed with diet only. He does not want to take a statin.

## 2023-10-22 NOTE — Progress Notes (Addendum)
Date:  10/22/2023   Name:  Thomas Allen   DOB:  1941/01/12   MRN:  [id number]   Chief Complaint: Memory Loss (Patient concerned of cognitive issues, and trouble focusing. Patient said this started after taking a medication for prostate cancer that he no longer takes. )  Diabetes He presents for his follow-up diabetic visit. He has type 2 diabetes mellitus. His disease course has been stable. Pertinent negatives for hypoglycemia include no dizziness, headaches or nervousness/anxiousness. Pertinent negatives for diabetes include no chest pain and no fatigue. Current diabetic treatment includes oral agent (monotherapy). He is compliant with treatment all of the time.   MCI - he noted mild forgetfulness; his wife is here and endorsed the same.  It started several years ago after treatment for prostate cancer with Enza.  This has been held since last August.  Review of Systems  Constitutional:  Negative for chills, fatigue, fever and unexpected weight change.  HENT:  Negative for trouble swallowing.   Respiratory:  Negative for cough, chest tightness and shortness of breath.   Cardiovascular:  Negative for chest pain and palpitations.  Gastrointestinal:  Negative for diarrhea and vomiting.  Genitourinary:  Negative for dysuria and hematuria.  Musculoskeletal:  Positive for back pain. Negative for myalgias.  Neurological:  Negative for dizziness, light-headedness and headaches.  Psychiatric/Behavioral:  Positive for decreased concentration. Negative for dysphoric mood and sleep disturbance. The patient is not nervous/anxious.      Lab Results  Component Value Date   NA 136 07/16/2023   K 4.1 07/16/2023   CO2 25 07/16/2023   GLUCOSE 149 (H) 07/16/2023   BUN 25 (H) 07/16/2023   CREATININE 1.01 07/16/2023   CALCIUM 9.2 07/16/2023   EGFR 85 11/20/2022   GFRNONAA >60 07/16/2023   Lab Results  Component Value Date   CHOL 166 10/12/2021   HDL 45 10/12/2021   LDLCALC 91 10/12/2021    TRIG 175 (H) 10/12/2021   CHOLHDL 3.7 10/12/2021   Lab Results  Component Value Date   TSH 1.620 11/20/2022   Lab Results  Component Value Date   HGBA1C 5.7 (A) 05/18/2023   Lab Results  Component Value Date   WBC 10.2 07/16/2023   HGB 13.9 07/16/2023   HCT 41.0 07/16/2023   MCV 94.7 07/16/2023   PLT 230 07/16/2023   Lab Results  Component Value Date   ALT 19 07/16/2023   AST 23 07/16/2023   ALKPHOS 65 07/16/2023   BILITOT 0.7 07/16/2023   No results found for: "25OHVITD2", "25OHVITD3", "VD25OH"   Patient Active Problem List   Diagnosis Date Noted   MCI (mild cognitive impairment) 10/22/2023   Trigger little finger of right hand 04/10/2023   Diarrhea 11/14/2022   Status post laparoscopic hernia repair 02/02/2022   Low back strain, initial encounter 01/28/2021   Hand pain, right 08/23/2017   Erectile dysfunction following radiation therapy 06/26/2017   Urinary frequency 06/18/2017   Hyperlipidemia associated with type 2 diabetes mellitus (HCC) 03/12/2017   Asymptomatic PVCs 02/05/2017   Gastroesophageal reflux disease 02/05/2017   Alcohol use disorder 01/19/2016   Type II diabetes mellitus with complication (HCC) 07/26/2015   Carpal tunnel syndrome 03/30/2015   Essential hypertension 03/30/2015   Abnormal LFTs 03/30/2015   H/O adenomatous polyp of colon 03/30/2015   Tobacco use disorder, moderate, in sustained remission 03/30/2015   Prostate cancer (HCC) 09/02/2013   Posterior calcaneal exostosis 02/26/2013   Calcific Achilles tendinitis 02/26/2013    Allergies  Allergen  Reactions   Ace Inhibitors Cough    Past Surgical History:  Procedure Laterality Date   APPENDECTOMY     COLONOSCOPY  2008   benign polyps   COLONOSCOPY WITH PROPOFOL N/A 08/07/2017   Procedure: COLONOSCOPY WITH PROPOFOL;  Surgeon: Christena Deem, MD;  [facility] ENDOSCOPY;  Service: Endoscopy;  Laterality: N/A;   COLONOSCOPY WITH PROPOFOL N/A 12/03/2020   Procedure:  COLONOSCOPY WITH PROPOFOL;  Surgeon: Regis Bill, MD;  Location: [facility] ENDOSCOPY;  Service: Endoscopy;  Laterality: N/A;   ESOPHAGOGASTRODUODENOSCOPY (EGD) WITH PROPOFOL N/A 08/07/2017   Procedure: ESOPHAGOGASTRODUODENOSCOPY (EGD) WITH PROPOFOL;  Surgeon: Christena Deem, MD;  Location: [facility] ENDOSCOPY;  Service: Endoscopy;  Laterality: N/A;   INSERTION OF MESH Right 01/16/2022   Procedure: INSERTION OF MESH;  Surgeon: Campbell Lerner, MD;  Location: [facility] ORS;  Service: General;  Laterality: Right;   lithopexy     PROSTATE BIOPSY  2012   malignant   REPLACEMENT TOTAL KNEE Bilateral    2009,2015    Social History   Tobacco Use   Smoking status: Former    Current packs/day: 0.00    Types: Cigarettes    Quit date: 1976    Years since quitting: 49.1   Smokeless tobacco: Never  Vaping Use   Vaping status: Never Used  Substance Use Topics   Alcohol use: Yes    Alcohol/week: 14.0 standard drinks of alcohol    Types: 14 Shots of liquor per week    Comment: 2 scotch drinks per night   Drug use: No     Medication list has been reviewed and updated.  Current Meds  Medication Sig   calcium carbonate (OS-CAL - DOSED IN MG OF ELEMENTAL CALCIUM) 1250 (500 Ca) MG tablet Take 1 tablet by mouth.   loperamide (IMODIUM A-D) 2 MG tablet Take 2 mg by mouth 4 (four) times daily as needed for diarrhea or loose stools.   metFORMIN (GLUCOPHAGE) 1000 MG tablet TAKE 1 TABLET BY MOUTH EVERY DAY WITH BREAKFAST   Meth-Hyo-M Bl-Na Phos-Ph Sal (URIBEL) 118 MG CAPS Take 1 capsule (118 mg total) by mouth 4 (four) times daily as needed. (Patient taking differently: Take 1 capsule by mouth 4 (four) times daily as needed. Takes one daily)   omeprazole (PRILOSEC) 20 MG capsule Take 1 capsule (20 mg total) by mouth daily.       10/22/2023    3:16 PM 05/18/2023    3:33 PM 11/14/2022    4:08 PM 05/18/2022   10:56 AM  GAD 7 : Generalized Anxiety Score  Nervous, Anxious, on Edge 0 0 0 0  Control/stop  worrying 0 0 1 0  Worry too much - different things 0 0 1 0  Trouble relaxing 0 0 0 0  Restless 0 0 0 0  Easily annoyed or irritable 0 0 0 0  Afraid - awful might happen 0 0 0 0  Total GAD 7 Score 0 0 2 0  Anxiety Difficulty Not difficult at all Not difficult at all Not difficult at all Not difficult at all       10/22/2023    3:15 PM 05/18/2023    3:33 PM 12/27/2022    1:31 PM  Depression screen PHQ 2/9  Decreased Interest 0 0 0  Down, Depressed, Hopeless 0 0 0  PHQ - 2 Score 0 0 0  Altered sleeping 0 0 0  Tired, decreased energy 0 0 0  Change in appetite 0 0 0  Feeling bad or  failure about yourself  0 0 0  Trouble concentrating 0 0 0  Moving slowly or fidgety/restless 0 0 0  Suicidal thoughts 0 0 0  PHQ-9 Score 0 0 0  Difficult doing work/chores Not difficult at all Not difficult at all Not difficult at all      10/22/2023    3:11 PM 12/27/2022    1:37 PM 11/11/2018    8:56 AM 08/23/2017   11:04 AM 08/07/2016    9:05 AM  6CIT Screen  What Year? 0 points 0 points 0 points 0 points 0 points  What month? 0 points 0 points 0 points 0 points 0 points  What time? 0 points 3 points 0 points 0 points 0 points  Count back from 20 0 points 0 points 0 points 0 points 0 points  Months in reverse 0 points 0 points 0 points 0 points 0 points  Repeat phrase 4 points 0 points 0 points 4 points 0 points  Total Score 4 points 3 points 0 points 4 points 0 points      BP Readings from Last 3 Encounters:  10/22/23 124/74  07/16/23 (!) 159/81  05/18/23 104/66    Physical Exam Vitals and nursing note reviewed.  Constitutional:      General: He is not in acute distress.    Appearance: Normal appearance. He is well-developed.  HENT:     Head: Normocephalic and atraumatic.  Neck:     Vascular: No carotid bruit.  Cardiovascular:     Rate and Rhythm: Normal rate and regular rhythm.  Pulmonary:     Effort: Pulmonary effort is normal. No respiratory distress.     Breath sounds: No  wheezing or rhonchi.  Abdominal:     Palpations: Abdomen is soft.     Tenderness: There is no abdominal tenderness.  Musculoskeletal:     Cervical back: Normal range of motion.     Right lower leg: No edema.     Left lower leg: No edema.  Lymphadenopathy:     Cervical: No cervical adenopathy.  Skin:    General: Skin is warm and dry.     Findings: No rash.  Neurological:     Mental Status: He is alert and oriented to person, place, and time.  Psychiatric:        Attention and Perception: Attention normal.        Mood and Affect: Mood normal.        Behavior: Behavior normal.        Thought Content: Thought content normal.        Judgment: Judgment normal.    Diabetic Foot Exam - Simple   Simple Foot Form Diabetic Foot exam was performed with the following findings: Yes 10/22/2023  3:34 PM  Visual Inspection No deformities, no ulcerations, no other skin breakdown bilaterally: Yes Sensation Testing Intact to touch and monofilament testing bilaterally: Yes Pulse Check Posterior Tibialis and Dorsalis pulse intact bilaterally: Yes Comments      Wt Readings from Last 3 Encounters:  10/22/23 196 lb (88.9 kg)  07/16/23 180 lb (81.6 kg)  05/18/23 193 lb (87.5 kg)    BP 124/74   Pulse 80   Ht 5\' 8"  (1.727 m)   Wt 196 lb (88.9 kg)   SpO2 97%   BMI 29.80 kg/m   Assessment and Plan:  Problem List Items Addressed This Visit       Unprioritized   Prostate cancer (HCC) (Chronic)   Type II diabetes mellitus  with complication (HCC) - Primary (Chronic)   Blood sugars stable without hypoglycemic symptoms or events. Currently managed with MTF. Changes made last visit are none. Lab Results  Component Value Date   HGBA1C 5.7 (A) 05/18/2023         Relevant Orders   CBC with Differential/Platelet   Comprehensive metabolic panel   Hemoglobin A1c   TSH   Hyperlipidemia associated with type 2 diabetes mellitus (HCC) (Chronic)   Currently managed with diet only. He does  not want to take a statin.      Relevant Orders   Lipid panel   MCI (mild cognitive impairment)   Appears stable over the past few years but wife more concerned. Will get TSH and B12 If normal, would refer to Neurology for further evaluation.      Relevant Orders   Vitamin B12   TSH    Return in about 3 months (around 01/19/2024).    Reubin Milan, MD [facility] Primary Care and Sports Medicine [place]

## 2023-10-22 NOTE — Assessment & Plan Note (Signed)
Appears stable over the past few years but wife more concerned. Will get TSH and B12 If normal, would refer to Neurology for further evaluation.

## 2023-10-22 NOTE — Assessment & Plan Note (Signed)
Blood sugars stable without hypoglycemic symptoms or events. Currently managed with MTF. Changes made last visit are none. Lab Results  Component Value Date   HGBA1C 5.7 (A) 05/18/2023

## 2023-10-23 ENCOUNTER — Encounter: Payer: Self-pay | Admitting: Internal Medicine

## 2023-10-23 LAB — CBC WITH DIFFERENTIAL/PLATELET
Basophils Absolute: 0 10*3/uL (ref 0.0–0.2)
Basos: 0 %
EOS (ABSOLUTE): 0.3 10*3/uL (ref 0.0–0.4)
Eos: 3 %
Hematocrit: 38.4 % (ref 37.5–51.0)
Hemoglobin: 12.9 g/dL — ABNORMAL LOW (ref 13.0–17.7)
Immature Grans (Abs): 0 10*3/uL (ref 0.0–0.1)
Immature Granulocytes: 0 %
Lymphocytes Absolute: 2.3 10*3/uL (ref 0.7–3.1)
Lymphs: 24 %
MCH: 32.6 pg (ref 26.6–33.0)
MCHC: 33.6 g/dL (ref 31.5–35.7)
MCV: 97 fL (ref 79–97)
Monocytes Absolute: 0.8 10*3/uL (ref 0.1–0.9)
Monocytes: 9 %
Neutrophils Absolute: 6.3 10*3/uL (ref 1.4–7.0)
Neutrophils: 64 %
Platelets: 234 10*3/uL (ref 150–450)
RBC: 3.96 x10E6/uL — ABNORMAL LOW (ref 4.14–5.80)
RDW: 13.1 % (ref 11.6–15.4)
WBC: 9.8 10*3/uL (ref 3.4–10.8)

## 2023-10-23 LAB — COMPREHENSIVE METABOLIC PANEL
ALT: 20 [IU]/L (ref 0–44)
AST: 14 [IU]/L (ref 0–40)
Albumin: 4.3 g/dL (ref 3.7–4.7)
Alkaline Phosphatase: 86 [IU]/L (ref 44–121)
BUN/Creatinine Ratio: 38 — ABNORMAL HIGH (ref 10–24)
BUN: 36 mg/dL — ABNORMAL HIGH (ref 8–27)
Bilirubin Total: 0.3 mg/dL (ref 0.0–1.2)
CO2: 20 mmol/L (ref 20–29)
Calcium: 9.6 mg/dL (ref 8.6–10.2)
Chloride: 103 mmol/L (ref 96–106)
Creatinine, Ser: 0.96 mg/dL (ref 0.76–1.27)
Globulin, Total: 2.6 g/dL (ref 1.5–4.5)
Glucose: 107 mg/dL — ABNORMAL HIGH (ref 70–99)
Potassium: 4.3 mmol/L (ref 3.5–5.2)
Sodium: 137 mmol/L (ref 134–144)
Total Protein: 6.9 g/dL (ref 6.0–8.5)
eGFR: 79 mL/min/{1.73_m2} (ref >59)

## 2023-10-23 LAB — LIPID PANEL
Chol/HDL Ratio: 5 {ratio} (ref 0.0–5.0)
Cholesterol, Total: 189 mg/dL (ref 100–199)
HDL: 38 mg/dL — ABNORMAL LOW (ref >39)
LDL Chol Calc (NIH): 111 mg/dL — ABNORMAL HIGH (ref 0–99)
Triglycerides: 228 mg/dL — ABNORMAL HIGH (ref 0–149)
VLDL Cholesterol Cal: 40 mg/dL (ref 5–40)

## 2023-10-23 LAB — VITAMIN B12: Vitamin B-12: 449 pg/mL (ref 232–1245)

## 2023-10-23 LAB — HEMOGLOBIN A1C
Est. average glucose Bld gHb Est-mCnc: 131 mg/dL
Hgb A1c MFr Bld: 6.2 % — ABNORMAL HIGH (ref 4.8–5.6)

## 2023-10-23 LAB — TSH: TSH: 2 u[IU]/mL (ref 0.450–4.500)

## 2023-11-09 ENCOUNTER — Other Ambulatory Visit: Payer: Self-pay | Admitting: Internal Medicine

## 2023-11-09 DIAGNOSIS — K219 Gastro-esophageal reflux disease without esophagitis: Secondary | ICD-10-CM

## 2023-11-09 NOTE — Telephone Encounter (Signed)
 Requested Prescriptions  Pending Prescriptions Disp Refills   omeprazole (PRILOSEC) 20 MG capsule [Pharmacy Med Name: OMEPRAZOLE DR 20 MG CAPSULE] 90 capsule 0    Sig: TAKE 1 CAPSULE BY MOUTH EVERY DAY     Gastroenterology: Proton Pump Inhibitors Passed - 11/09/2023 12:19 PM      Passed - Valid encounter within last 12 months    Recent Outpatient Visits           5 months ago Type II diabetes mellitus with complication [facility])   [place] [facility] at [facility], Nyoka Cowden, MD   7 months ago Acute cystitis with hematuria   [facility] at [facility], Nyoka Cowden, MD   12 months ago Diarrhea, unspecified type   [facility] Medicine at [facility], Nyoka Cowden, MD   1 year ago Type II diabetes mellitus with complication [facility])   [place] [facility] at [facility], Nyoka Cowden, MD   1 year ago Acute cystitis with hematuria   [facility] at [facility], Nyoka Cowden, MD       Future Appointments             In 2 months Judithann Graves, Nyoka Cowden, MD [facility] Primary [facility] Medicine at [facility]

## 2023-11-14 DIAGNOSIS — Z191 Hormone sensitive malignancy status: Secondary | ICD-10-CM | POA: Diagnosis not present

## 2023-11-14 DIAGNOSIS — C772 Secondary and unspecified malignant neoplasm of intra-abdominal lymph nodes: Secondary | ICD-10-CM | POA: Diagnosis not present

## 2023-11-14 DIAGNOSIS — S62102D Fracture of unspecified carpal bone, left wrist, subsequent encounter for fracture with routine healing: Secondary | ICD-10-CM | POA: Diagnosis not present

## 2023-11-14 DIAGNOSIS — Z87891 Personal history of nicotine dependence: Secondary | ICD-10-CM | POA: Diagnosis not present

## 2023-11-14 DIAGNOSIS — Z79818 Long term (current) use of other agents affecting estrogen receptors and estrogen levels: Secondary | ICD-10-CM | POA: Diagnosis not present

## 2023-11-14 DIAGNOSIS — Z5111 Encounter for antineoplastic chemotherapy: Secondary | ICD-10-CM | POA: Diagnosis not present

## 2023-11-14 DIAGNOSIS — C61 Malignant neoplasm of prostate: Secondary | ICD-10-CM | POA: Diagnosis not present

## 2023-11-16 ENCOUNTER — Encounter: Payer: Self-pay | Admitting: Internal Medicine

## 2023-12-14 DIAGNOSIS — D225 Melanocytic nevi of trunk: Secondary | ICD-10-CM | POA: Diagnosis not present

## 2023-12-14 DIAGNOSIS — D2272 Melanocytic nevi of left lower limb, including hip: Secondary | ICD-10-CM | POA: Diagnosis not present

## 2023-12-14 DIAGNOSIS — L570 Actinic keratosis: Secondary | ICD-10-CM | POA: Diagnosis not present

## 2023-12-14 DIAGNOSIS — D2271 Melanocytic nevi of right lower limb, including hip: Secondary | ICD-10-CM | POA: Diagnosis not present

## 2023-12-14 DIAGNOSIS — L821 Other seborrheic keratosis: Secondary | ICD-10-CM | POA: Diagnosis not present

## 2023-12-14 DIAGNOSIS — D2261 Melanocytic nevi of right upper limb, including shoulder: Secondary | ICD-10-CM | POA: Diagnosis not present

## 2023-12-14 DIAGNOSIS — D2262 Melanocytic nevi of left upper limb, including shoulder: Secondary | ICD-10-CM | POA: Diagnosis not present

## 2024-01-02 ENCOUNTER — Ambulatory Visit: Payer: Medicare Other | Admitting: Emergency Medicine

## 2024-01-02 VITALS — Ht 68.0 in | Wt 180.0 lb

## 2024-01-02 DIAGNOSIS — Z0000 Encounter for general adult medical examination without abnormal findings: Secondary | ICD-10-CM

## 2024-01-02 NOTE — Patient Instructions (Addendum)
 [name] , Thank you for taking time to come for your Medicare Wellness Visit. I appreciate your ongoing commitment to your health goals. Please review the following plan we discussed and let me know if I can assist you in the future.   Referrals/Orders/Follow-Ups/Clinician Recommendations: Keep up the good work!  This is a list of the screening recommended for you and due dates:  Health Maintenance  Topic Date Due   DTaP/Tdap/Td vaccine (1 - Tdap) Never done   Pneumonia Vaccine (1 of 2 - PCV) Never done   Zoster (Shingles) Vaccine (1 of 2) Never done   COVID-19 Vaccine (3 - Pfizer risk series) 01/14/2020   Yearly kidney health urinalysis for diabetes  10/14/2023   Flu Shot  04/04/2024   Hemoglobin A1C  04/20/2024   Eye exam for diabetics  07/01/2024   Yearly kidney function blood test for diabetes  10/21/2024   Complete foot exam   10/21/2024   Medicare Annual Wellness Visit  01/01/2025   Colon Cancer Screening  12/03/2025   HPV Vaccine  Aged Out   Meningitis B Vaccine  Aged Out   Hepatitis C Screening  Discontinued    Advanced directives: (In Chart) A copy of your advanced directives are scanned into your chart should your provider ever need it.  Next Medicare Annual Wellness Visit scheduled for next year: Yes, 01/07/25 @ 1:20pm (phone visit)  Fall Prevention in the Home, Adult Falls can cause injuries and affect people of all ages. There are many simple things that you can do to make your home safe and to help prevent falls. If you need it, ask for help making these changes. What actions can I take to prevent falls? General information Use good lighting in all rooms. Make sure to: Replace any light bulbs that burn out. Turn on lights if it is dark and use night-lights. Keep items that you use often in easy-to-reach places. Lower the shelves around your home if needed. Move furniture so that there are clear paths around it. Do not keep throw rugs or other things on the floor  that can make you trip. If any of your floors are uneven, fix them. Add color or contrast paint or tape to clearly mark and help you see: Grab bars or handrails. First and last steps of staircases. Where the edge of each step is. If you use a ladder or stepladder: Make sure that it is fully opened. Do not climb a closed ladder. Make sure the sides of the ladder are locked in place. Have someone hold the ladder while you use it. Know where your pets are as you move through your home. What can I do in the bathroom?     Keep the floor dry. Clean up any water  that is on the floor right away. Remove soap buildup in the bathtub or shower. Buildup makes bathtubs and showers slippery. Use non-skid mats or decals on the floor of the bathtub or shower. Attach bath mats securely with double-sided, non-slip rug tape. If you need to sit down while you are in the shower, use a non-slip stool. Install grab bars by the toilet and in the bathtub and shower. Do not use towel bars as grab bars. What can I do in the bedroom? Make sure that you have a light by your bed that is easy to reach. Do not use any sheets or blankets on your bed that hang to the floor. Have a firm bench or chair with side arms  that you can use for support when you get dressed. What can I do in the kitchen? Clean up any spills right away. If you need to reach something above you, use a sturdy step stool that has a grab bar. Keep electrical cables out of the way. Do not use floor polish or wax that makes floors slippery. What can I do with my stairs? Do not leave anything on the stairs. Make sure that you have a light switch at the top and the bottom of the stairs. Have them installed if you do not have them. Make sure that there are handrails on both sides of the stairs. Fix handrails that are broken or loose. Make sure that handrails are as long as the staircases. Install non-slip stair treads on all stairs in your home if they  do not have carpet. Avoid having throw rugs at the top or bottom of stairs, or secure the rugs with carpet tape to prevent them from moving. Choose a carpet design that does not hide the edge of steps on the stairs. Make sure that carpet is firmly attached to the stairs. Fix any carpet that is loose or worn. What can I do on the outside of my home? Use bright outdoor lighting. Repair the edges of walkways and driveways and fix any cracks. Clear paths of anything that can make you trip, such as tools or rocks. Add color or contrast paint or tape to clearly mark and help you see high doorway thresholds. Trim any bushes or trees on the main path into your home. Check that handrails are securely fastened and in good repair. Both sides of all steps should have handrails. Install guardrails along the edges of any raised decks or porches. Have leaves, snow, and ice cleared regularly. Use sand, salt, or ice melt on walkways during winter months if you live where there is ice and snow. In the garage, clean up any spills right away, including grease or oil spills. What other actions can I take? Review your medicines with your health care provider. Some medicines can make you confused or feel dizzy. This can increase your chance of falling. Wear closed-toe shoes that fit well and support your feet. Wear shoes that have rubber soles and low heels. Use a cane, walker, scooter, or crutches that help you move around if needed. Talk with your provider about other ways that you can decrease your risk of falls. This may include seeing a physical therapist to learn to do exercises to improve movement and strength. Where to find more information Centers for Disease Control and Prevention, STEADI: TonerPromos.no [employer] on Aging: BaseRingTones.pl National Institute on Aging: BaseRingTones.pl Contact a health care provider if: You are afraid of falling at home. You feel weak, drowsy, or dizzy at home. You fall at  home. Get help right away if you: Lose consciousness or have trouble moving after a fall. Have a fall that causes a head injury. These symptoms may be an emergency. Get help right away. Call 911. Do not wait to see if the symptoms will go away. Do not drive yourself to the hospital. This information is not intended to replace advice given to you by your health care provider. Make sure you discuss any questions you have with your health care provider. Document Revised: 04/24/2022 Document Reviewed: 04/24/2022 Elsevier Patient Education  2024 [employer].

## 2024-01-02 NOTE — Progress Notes (Signed)
 Subjective:   Thomas Allen is a 83 y.o. who presents for a Medicare Wellness preventive visit.  Visit Complete: Virtual I connected with  Thomas Allen on 01/02/24 by a audio enabled telemedicine application and verified that I am speaking with the correct person using two identifiers.  Patient Location: Home  Provider Location: Home Office  I discussed the limitations of evaluation and management by telemedicine. The patient expressed understanding and agreed to proceed.  Vital Signs: Because this visit was a virtual/telehealth visit, some criteria may be missing or patient reported. Any vitals not documented were not able to be obtained and vitals that have been documented are patient reported.  VideoDeclined- This patient declined Librarian, academic. Therefore the visit was completed with audio only.  Persons Participating in Visit:  Thomas Allen, wife and patient was present during visit.  AWV Questionnaire: No: Patient Medicare AWV questionnaire was not completed prior to this visit.  Cardiac Risk Factors include: advanced age (>51men, >16 women);male gender;hypertension;diabetes mellitus;dyslipidemia     Objective:    Today's Vitals   01/02/24 1314  Weight: 180 lb (81.6 kg)  Height: 5\' 8"  (1.727 m)   Body mass index is 27.37 kg/m.     01/02/2024    1:26 PM 12/27/2022    1:32 PM 06/12/2022    5:02 AM 01/16/2022   12:13 PM 01/12/2022   11:36 AM 12/21/2021    1:42 PM 02/28/2021    2:30 PM  Advanced Directives  Does Patient Have a Medical Advance Directive? Yes Yes Yes Yes Yes Yes No  Type of Estate agent of [place];Living will Healthcare Power of [place];Living will Healthcare Power of [place];Living will Healthcare Power of [place];Living will  Healthcare Power of [place];Living will   Does patient want to make changes to medical advance directive? No - Patient declined No - Patient declined  No - Patient declined No -  Patient declined    Copy of Healthcare Power of Attorney in Chart? Yes - validated most recent copy scanned in chart (See row information) Yes - validated most recent copy scanned in chart (See row information)  No - copy requested  Yes - validated most recent copy scanned in chart (See row information)   Would patient like information on creating a medical advance directive?       No - Patient declined    Current Medications (verified) Outpatient Encounter Medications as of 01/02/2024  Medication Sig   metFORMIN  (GLUCOPHAGE ) 1000 MG tablet TAKE 1 TABLET BY MOUTH EVERY DAY WITH BREAKFAST   omeprazole  (PRILOSEC) 20 MG capsule TAKE 1 CAPSULE BY MOUTH EVERY DAY   calcium carbonate (OS-CAL - DOSED IN MG OF ELEMENTAL CALCIUM) 1250 (500 Ca) MG tablet Take 1 tablet by mouth. (Patient not taking: Reported on 01/02/2024)   loperamide (IMODIUM A-D) 2 MG tablet Take 2 mg by mouth 4 (four) times daily as needed for diarrhea or loose stools. (Patient not taking: Reported on 01/02/2024)   Meth-Hyo-M Bl-Na Phos-Ph Sal (URIBEL ) 118 MG CAPS Take 1 capsule (118 mg total) by mouth 4 (four) times daily as needed. (Patient not taking: Reported on 01/02/2024)   No facility-administered encounter medications on file as of 01/02/2024.    Allergies (verified) Ace inhibitors   History: Past Medical History:  Diagnosis Date   Arthritis    Cancer (HCC)    Diabetes mellitus without complication (HCC)    GERD (gastroesophageal reflux disease)    Haglund's deformity    History of  kidney stones    Hyperlipidemia    Hypertension    Intractable hiccups 02/05/2017   Prostate CA [facility])    Sleep apnea    Past Surgical History:  Procedure Laterality Date   APPENDECTOMY     COLONOSCOPY  2008   benign polyps   COLONOSCOPY WITH PROPOFOL  N/A 08/07/2017   Procedure: COLONOSCOPY WITH PROPOFOL ;  Surgeon: Deveron Fly, MD;  Location: [facility] ENDOSCOPY;  Service: Endoscopy;  Laterality: N/A;   COLONOSCOPY WITH PROPOFOL  N/A  12/03/2020   Procedure: COLONOSCOPY WITH PROPOFOL ;  Surgeon: Shane Darling, MD;  Location: [facility] ENDOSCOPY;  Service: Endoscopy;  Laterality: N/A;   ESOPHAGOGASTRODUODENOSCOPY (EGD) WITH PROPOFOL  N/A 08/07/2017   Procedure: ESOPHAGOGASTRODUODENOSCOPY (EGD) WITH PROPOFOL ;  Surgeon: Deveron Fly, MD;  Location: [facility] ENDOSCOPY;  Service: Endoscopy;  Laterality: N/A;   INSERTION OF MESH Right 01/16/2022   Procedure: INSERTION OF MESH;  Surgeon: Flynn Hylan, MD;  Location: [facility] ORS;  Service: General;  Laterality: Right;   lithopexy     PROSTATE BIOPSY  2012   malignant   REPLACEMENT TOTAL KNEE Bilateral    2009,2015   Family History  Problem Relation Age of Onset   Diabetes Mother    Colon cancer Mother    Colon cancer Father    Prostate cancer Neg Hx    Bladder Cancer Neg Hx    Kidney cancer Neg Hx    Social History   Socioeconomic History   Marital status: Married    Spouse name: Diplomatic Services operational officer   Number of children: 3   Years of education: Not on file   Highest education level: Master's degree (e.g., MA, MS, MEng, MEd, MSW, MBA)  Occupational History    Comment: works part time Catering manager   Occupation: retired  Tobacco Use   Smoking status: Former    Current packs/day: 0.00    Average packs/day: 2.5 packs/day for 16.0 years (40.0 ttl pk-yrs)    Types: Cigarettes    Start date: 56    Quit date: 1976    Years since quitting: 49.3    Passive exposure: Past   Smokeless tobacco: Never  Vaping Use   Vaping status: Never Used  Substance and Sexual Activity   Alcohol use: Not Currently    Alcohol/week: 14.0 standard drinks of alcohol    Types: 14 Shots of liquor per week    Comment: 2 scotch drinks per night, last use 09/2023   Drug use: No   Sexual activity: Not on file  Other Topics Concern   Not on file  Social History Narrative   Lives at home with wife.   Social Drivers of Corporate investment banker Strain: Low Risk  (01/02/2024)   Overall Financial  Resource Strain (CARDIA)    Difficulty of Paying Living Expenses: Not hard at all  Food Insecurity: No Food Insecurity (01/02/2024)   Hunger Vital Sign    Worried About Running Out of Food in the Last Year: Never true    Ran Out of Food in the Last Year: Never true  Transportation Needs: No Transportation Needs (01/02/2024)   PRAPARE - Administrator, Civil Service (Medical): No    Lack of Transportation (Non-Medical): No  Physical Activity: Sufficiently Active (01/02/2024)   Exercise Vital Sign    Days of Exercise per Week: 5 days    Minutes of Exercise per Session: 30 min  Stress: No Stress Concern Present (01/02/2024)   [employer] of Occupational Health - Occupational Stress Questionnaire  Feeling of Stress : Not at all  Social Connections: Moderately Isolated (01/02/2024)   Social Connection and Isolation Panel [NHANES]    Frequency of Communication with Friends and Family: More than three times a week    Frequency of Social Gatherings with Friends and Family: More than three times a week    Attends Religious Services: Never    Database administrator or Organizations: No    Attends Engineer, structural: Never    Marital Status: Married    Tobacco Counseling Counseling given: Not Answered    Clinical Intake:  Pre-visit preparation completed: Yes  Pain : No/denies pain     BMI - recorded: 27.37 Nutritional Status: BMI 25 -29 Overweight Nutritional Risks: None Diabetes: Yes CBG done?: No Did pt. bring in CBG monitor from home?: No  Lab Results  Component Value Date   HGBA1C 6.2 (H) 10/22/2023   HGBA1C 5.7 (A) 05/18/2023   HGBA1C 5.8 (A) 10/13/2022     How often do you need to have someone help you when you read instructions, pamphlets, or other written materials from your doctor or pharmacy?: 1 - Never  Interpreter Needed?: No  Information entered by :: Jaunita Messier, CMA   Activities of Daily Living     01/02/2024    1:16 PM  In  your present state of health, do you have any difficulty performing the following activities:  Hearing? 0  Vision? 0  Difficulty concentrating or making decisions? 1  Comment mild cognitive impairment  Walking or climbing stairs? 0  Dressing or bathing? 0  Doing errands, shopping? 0  Preparing Food and eating ? N  Using the Toilet? N  In the past six months, have you accidently leaked urine? N  Managing your Medications? N  Managing your Finances? N  Housekeeping or managing your Housekeeping? N    Patient Care Team: Sheron Dixons, MD as PCP - General (Internal Medicine) Julia Oats, OD (Optometry) Hoimes, Marine Sia, DO as Referring Physician (Hematology and Oncology) Shane Darling, MD as Consulting Physician (Gastroenterology) Isenstein, Arin L, MD (Dermatology)  Indicate any recent Medical Services you may have received from other than [facility] providers in the past year (date may be approximate).     Assessment:   This is a routine wellness examination for Thomas Allen.  Hearing/Vision screen Hearing Screening - Comments:: Denies hearing loss Vision Screening - Comments:: Gets DM eye exam, Dr. Violet Grew [place]   Goals Addressed             This Visit's Progress    Patient Stated       Maintain current health       Depression Screen     01/02/2024    1:24 PM 10/22/2023    3:15 PM 05/18/2023    3:33 PM 12/27/2022    1:31 PM 11/14/2022    4:08 PM 05/18/2022   10:56 AM 01/09/2022    9:49 AM  PHQ 2/9 Scores  PHQ - 2 Score 0 0 0 0 0 0 0  PHQ- 9 Score 0 0 0 0 9 1 2     Fall Risk     01/02/2024    1:27 PM 10/22/2023    3:15 PM 05/18/2023    3:33 PM 12/27/2022    1:33 PM 11/14/2022    4:08 PM  Fall Risk   Falls in the past year? 1 1 0 1 1  Number falls in past yr: 0 0 0 0 1  Injury with Fall? 1 1 0  1  Risk for fall due to : History of fall(s);Impaired balance/gait;Orthopedic patient History of fall(s) No Fall Risks History of fall(s);Impaired balance/gait  History of fall(s);Impaired balance/gait  Follow up Falls prevention discussed;Falls evaluation completed;Education provided Falls evaluation completed Falls evaluation completed Falls evaluation completed;Falls prevention discussed Falls evaluation completed    MEDICARE RISK AT HOME:  Medicare Risk at Home Any stairs in or around the home?: No If so, are there any without handrails?: No Home free of loose throw rugs in walkways, pet beds, electrical cords, etc?: Yes Adequate lighting in your home to reduce risk of falls?: Yes Life alert?: No Use of a cane, walker or w/c?: No Grab bars in the bathroom?: Yes Shower chair or bench in shower?: No Elevated toilet seat or a handicapped toilet?: No  TIMED UP AND GO:  Was the test performed?  No  Cognitive Function: 6CIT completed        01/02/2024    1:29 PM 10/22/2023    3:11 PM 12/27/2022    1:37 PM 11/11/2018    8:56 AM 08/23/2017   11:04 AM  6CIT Screen  What Year? 0 points 0 points 0 points 0 points 0 points  What month? 0 points 0 points 0 points 0 points 0 points  What time? 0 points 0 points 3 points 0 points 0 points  Count back from 20 2 points 0 points 0 points 0 points 0 points  Months in reverse 0 points 0 points 0 points 0 points 0 points  Repeat phrase 0 points 4 points 0 points 0 points 4 points  Total Score 2 points 4 points 3 points 0 points 4 points    Immunizations Immunization History  Administered Date(s) Administered   PFIZER(Purple Top)SARS-COV-2 Vaccination 11/24/2019, 12/17/2019    Screening Tests Health Maintenance  Topic Date Due   DTaP/Tdap/Td (1 - Tdap) Never done   Pneumonia Vaccine 37+ Years old (1 of 2 - PCV) Never done   Zoster Vaccines- Shingrix (1 of 2) Never done   COVID-19 Vaccine (3 - Pfizer risk series) 01/14/2020   Diabetic kidney evaluation - Urine ACR  10/14/2023   INFLUENZA VACCINE  04/04/2024   HEMOGLOBIN A1C  04/20/2024   OPHTHALMOLOGY EXAM  07/01/2024   Diabetic kidney  evaluation - eGFR measurement  10/21/2024   FOOT EXAM  10/21/2024   Medicare Annual Wellness (AWV)  01/01/2025   Colonoscopy  12/03/2025   HPV VACCINES  Aged Out   Meningococcal B Vaccine  Aged Out   Hepatitis C Screening  Discontinued    Health Maintenance  Health Maintenance Due  Topic Date Due   DTaP/Tdap/Td (1 - Tdap) Never done   Pneumonia Vaccine 7+ Years old (1 of 2 - PCV) Never done   Zoster Vaccines- Shingrix (1 of 2) Never done   COVID-19 Vaccine (3 - Pfizer risk series) 01/14/2020   Diabetic kidney evaluation - Urine ACR  10/14/2023   Health Maintenance Items Addressed: See Nurse Notes  Additional Screening:  Vision Screening: Recommended annual ophthalmology exams for early detection of glaucoma and other disorders of the eye.  Dental Screening: Recommended annual dental exams for proper oral hygiene  Community Resource Referral / Chronic Care Management: CRR required this visit?  No   CCM required this visit?  No     Plan:     I have personally reviewed and noted the following in the patient's chart:   Medical and social history Use of  alcohol, tobacco or illicit drugs  Current medications and supplements including opioid prescriptions. Patient is not currently taking opioid prescriptions. Functional ability and status Nutritional status Physical activity Advanced directives List of other physicians Hospitalizations, surgeries, and ER visits in previous 12 months Vitals Screenings to include cognitive, depression, and falls Referrals and appointments  In addition, I have reviewed and discussed with patient certain preventive protocols, quality metrics, and best practice recommendations. A written personalized care plan for preventive services as well as general preventive health recommendations were provided to patient.     Jaunita Messier, CMA   01/02/2024   After Visit Summary: (MyChart) Due to this being a telephonic visit, the after visit  summary with patients personalized plan was offered to patient via MyChart   Notes: Please refer to Routing Comments.

## 2024-01-22 ENCOUNTER — Other Ambulatory Visit: Payer: Self-pay | Admitting: Internal Medicine

## 2024-01-22 DIAGNOSIS — E118 Type 2 diabetes mellitus with unspecified complications: Secondary | ICD-10-CM

## 2024-01-23 ENCOUNTER — Encounter: Payer: Self-pay | Admitting: Internal Medicine

## 2024-01-23 ENCOUNTER — Ambulatory Visit (INDEPENDENT_AMBULATORY_CARE_PROVIDER_SITE_OTHER): Admitting: Internal Medicine

## 2024-01-23 ENCOUNTER — Ambulatory Visit: Payer: Medicare Other | Admitting: Internal Medicine

## 2024-01-23 VITALS — BP 108/64 | HR 73 | Ht 68.0 in | Wt 199.5 lb

## 2024-01-23 DIAGNOSIS — E118 Type 2 diabetes mellitus with unspecified complications: Secondary | ICD-10-CM

## 2024-01-23 DIAGNOSIS — I10 Essential (primary) hypertension: Secondary | ICD-10-CM | POA: Diagnosis not present

## 2024-01-23 DIAGNOSIS — Z7984 Long term (current) use of oral hypoglycemic drugs: Secondary | ICD-10-CM

## 2024-01-23 LAB — POCT GLYCOSYLATED HEMOGLOBIN (HGB A1C): Hemoglobin A1C: 6.1 % — AB (ref 4.0–5.6)

## 2024-01-23 MED ORDER — METFORMIN HCL 1000 MG PO TABS: 1000.0000 mg | ORAL_TABLET | Freq: Every day | ORAL | 1 refills | Status: DC

## 2024-01-23 NOTE — Assessment & Plan Note (Signed)
 Currently well controlled with lifestyle changes. Will continue to monitor.

## 2024-01-23 NOTE — Progress Notes (Signed)
 Date:  01/23/2024   Name:  Thomas Allen   DOB:  1941-03-19   MRN:  [id number]   Chief Complaint: Diabetes and Hypertension  Diabetes He presents for his follow-up diabetic visit. He has type 2 diabetes mellitus. His disease course has been stable. Pertinent negatives for hypoglycemia include no headaches, nervousness/anxiousness or tremors. Pertinent negatives for diabetes include no chest pain, no fatigue, no polydipsia and no polyuria. Current diabetic treatment includes oral agent (monotherapy). He is compliant with treatment all of the time. An ACE inhibitor/angiotensin II receptor blocker is not being taken. Eye exam is current.  Hypertension This is a chronic problem. The problem is controlled. Pertinent negatives include no chest pain, headaches, palpitations or shortness of breath. Past treatments include lifestyle changes. The current treatment provides significant improvement.    Review of Systems  Constitutional:  Negative for appetite change, fatigue and unexpected weight change.  Eyes:  Negative for visual disturbance.  Respiratory:  Negative for cough, shortness of breath and wheezing.   Cardiovascular:  Negative for chest pain, palpitations and leg swelling.  Gastrointestinal:  Negative for abdominal pain and blood in stool.  Endocrine: Negative for polydipsia and polyuria.  Genitourinary:  Negative for dysuria and hematuria.  Skin:  Negative for color change and rash.  Neurological:  Negative for tremors, numbness and headaches.  Psychiatric/Behavioral:  Negative for dysphoric mood and sleep disturbance. The patient is not nervous/anxious.      Lab Results  Component Value Date   NA 137 10/22/2023   K 4.3 10/22/2023   CO2 20 10/22/2023   GLUCOSE 107 (H) 10/22/2023   BUN 36 (H) 10/22/2023   CREATININE 0.96 10/22/2023   CALCIUM 9.6 10/22/2023   EGFR 79 10/22/2023   GFRNONAA >60 07/16/2023   Lab Results  Component Value Date   CHOL 189 10/22/2023   HDL 38 (L)  10/22/2023   LDLCALC 111 (H) 10/22/2023   TRIG 228 (H) 10/22/2023   CHOLHDL 5.0 10/22/2023   Lab Results  Component Value Date   TSH 2.000 10/22/2023   Lab Results  Component Value Date   HGBA1C 6.1 (A) 01/23/2024   Lab Results  Component Value Date   WBC 9.8 10/22/2023   HGB 12.9 (L) 10/22/2023   HCT 38.4 10/22/2023   MCV 97 10/22/2023   PLT 234 10/22/2023   Lab Results  Component Value Date   ALT 20 10/22/2023   AST 14 10/22/2023   ALKPHOS 86 10/22/2023   BILITOT 0.3 10/22/2023   No results found for: "25OHVITD2", "25OHVITD3", "VD25OH"   Patient Active Problem List   Diagnosis Date Noted   MCI (mild cognitive impairment) 10/22/2023   Trigger little finger of right hand 04/10/2023   Diarrhea 11/14/2022   Status post laparoscopic hernia repair 02/02/2022   Low back strain, initial encounter 01/28/2021   Hand pain, right 08/23/2017   Erectile dysfunction following radiation therapy 06/26/2017   Urinary frequency 06/18/2017   Hyperlipidemia associated with type 2 diabetes mellitus (HCC) 03/12/2017   Asymptomatic PVCs 02/05/2017   Gastroesophageal reflux disease 02/05/2017   Alcohol use disorder 01/19/2016   Type II diabetes mellitus with complication (HCC) 07/26/2015   Carpal tunnel syndrome 03/30/2015   Essential hypertension 03/30/2015   Abnormal LFTs 03/30/2015   H/O adenomatous polyp of colon 03/30/2015   Tobacco use disorder, moderate, in sustained remission 03/30/2015   Prostate cancer (HCC) 09/02/2013   Posterior calcaneal exostosis 02/26/2013   Calcific Achilles tendinitis 02/26/2013    Allergies  Allergen Reactions   Ace Inhibitors Cough    Past Surgical History:  Procedure Laterality Date   APPENDECTOMY     COLONOSCOPY  2008   benign polyps   COLONOSCOPY WITH PROPOFOL  N/A 08/07/2017   Procedure: COLONOSCOPY WITH PROPOFOL ;  Surgeon: Deveron Fly, MD;  Location: [facility] ENDOSCOPY;  Service: Endoscopy;  Laterality: N/A;   COLONOSCOPY WITH  PROPOFOL  N/A 12/03/2020   Procedure: COLONOSCOPY WITH PROPOFOL ;  Surgeon: Shane Darling, MD;  Location: [facility] ENDOSCOPY;  Service: Endoscopy;  Laterality: N/A;   ESOPHAGOGASTRODUODENOSCOPY (EGD) WITH PROPOFOL  N/A 08/07/2017   Procedure: ESOPHAGOGASTRODUODENOSCOPY (EGD) WITH PROPOFOL ;  Surgeon: Deveron Fly, MD;  Location: [facility] ENDOSCOPY;  Service: Endoscopy;  Laterality: N/A;   INSERTION OF MESH Right 01/16/2022   Procedure: INSERTION OF MESH;  Surgeon: Flynn Hylan, MD;  Location: [facility] ORS;  Service: General;  Laterality: Right;   lithopexy     PROSTATE BIOPSY  2012   malignant   REPLACEMENT TOTAL KNEE Bilateral    2009,2015    Social History   Tobacco Use   Smoking status: Former    Current packs/day: 0.00    Average packs/day: 2.5 packs/day for 16.0 years (40.0 ttl pk-yrs)    Types: Cigarettes    Start date: 67    Quit date: 38    Years since quitting: 49.4    Passive exposure: Past   Smokeless tobacco: Never  Vaping Use   Vaping status: Never Used  Substance Use Topics   Alcohol use: Not Currently    Alcohol/week: 14.0 standard drinks of alcohol    Types: 14 Shots of liquor per week    Comment: 2 scotch drinks per night, last use 09/2023   Drug use: No     Medication list has been reviewed and updated.  Current Meds  Medication Sig   Multiple Vitamin (MULTIVITAMIN) tablet Take 1 tablet by mouth daily.   omeprazole  (PRILOSEC) 20 MG capsule TAKE 1 CAPSULE BY MOUTH EVERY DAY   [DISCONTINUED] metFORMIN  (GLUCOPHAGE ) 1000 MG tablet TAKE 1 TABLET BY MOUTH EVERY DAY WITH BREAKFAST       01/23/2024   11:02 AM 10/22/2023    3:16 PM 05/18/2023    3:33 PM 11/14/2022    4:08 PM  GAD 7 : Generalized Anxiety Score  Nervous, Anxious, on Edge 0 0 0 0  Control/stop worrying 0 0 0 1  Worry too much - different things 0 0 0 1  Trouble relaxing 0 0 0 0  Restless 0 0 0 0  Easily annoyed or irritable 0 0 0 0  Afraid - awful might happen 0 0 0 0  Total GAD 7 Score  0 0 0 2  Anxiety Difficulty Not difficult at all Not difficult at all Not difficult at all Not difficult at all       01/23/2024   11:02 AM 01/02/2024    1:24 PM 10/22/2023    3:15 PM  Depression screen PHQ 2/9  Decreased Interest 0 0 0  Down, Depressed, Hopeless 0 0 0  PHQ - 2 Score 0 0 0  Altered sleeping 0 0 0  Tired, decreased energy 0 0 0  Change in appetite 0 0 0  Feeling bad or failure about yourself  0 0 0  Trouble concentrating 0 0 0  Moving slowly or fidgety/restless 0 0 0  Suicidal thoughts 0 0 0  PHQ-9 Score 0 0 0  Difficult doing work/chores Not difficult at all Not difficult at all Not difficult  at all    BP Readings from Last 3 Encounters:  01/23/24 108/64  10/22/23 124/74  07/16/23 (!) 159/81    Physical Exam Vitals and nursing note reviewed.  Constitutional:      General: He is not in acute distress.    Appearance: Normal appearance. He is well-developed.  HENT:     Head: Normocephalic and atraumatic.  Neck:     Vascular: No carotid bruit.  Cardiovascular:     Rate and Rhythm: Normal rate and regular rhythm.  Pulmonary:     Effort: Pulmonary effort is normal. No respiratory distress.     Breath sounds: No wheezing or rhonchi.  Musculoskeletal:     Right lower leg: No edema.     Left lower leg: No edema.  Lymphadenopathy:     Cervical: No cervical adenopathy.  Skin:    General: Skin is warm and dry.     Findings: No rash.  Neurological:     General: No focal deficit present.     Mental Status: He is alert and oriented to person, place, and time.  Psychiatric:        Mood and Affect: Mood normal.        Behavior: Behavior normal.     Wt Readings from Last 3 Encounters:  01/23/24 199 lb 8 oz (90.5 kg)  01/02/24 180 lb (81.6 kg)  10/22/23 196 lb (88.9 kg)    BP 108/64   Pulse 73   Ht 5\' 8"  (1.727 m)   Wt 199 lb 8 oz (90.5 kg)   SpO2 99%   BMI 30.33 kg/m   Assessment and Plan:  Problem List Items Addressed This Visit        Unprioritized   Essential hypertension (Chronic)   Currently well controlled with lifestyle changes. Will continue to monitor.      Type II diabetes mellitus with complication (HCC) - Primary (Chronic)   Blood sugars have been stable.  No recent hypoglycemic events requiring assistance. Currently medications are MTF. Lab Results  Component Value Date   HGBA1C 6.1 (A) 01/23/2024   Last visit no changes were made. A1C is stable.       Relevant Medications   metFORMIN  (GLUCOPHAGE ) 1000 MG tablet   Other Relevant Orders   POCT glycosylated hemoglobin (Hb A1C) (Completed)   Microalbumin / creatinine urine ratio   Other Visit Diagnoses       Long term current use of oral hypoglycemic drug           Return in about 4 months (around 05/25/2024) for DM.    Sheron Dixons, MD [facility] [place]

## 2024-01-23 NOTE — Assessment & Plan Note (Signed)
 Blood sugars have been stable.  No recent hypoglycemic events requiring assistance. Currently medications are MTF. Lab Results  Component Value Date   HGBA1C 6.1 (A) 01/23/2024   Last visit no changes were made. A1C is stable.

## 2024-01-24 LAB — MICROALBUMIN / CREATININE URINE RATIO
Creatinine, Urine: 82.7 mg/dL
Microalb/Creat Ratio: <4 mg/g{creat} (ref 0–29)
Microalbumin, Urine: <3 ug/mL

## 2024-01-27 ENCOUNTER — Other Ambulatory Visit: Payer: Self-pay | Admitting: Internal Medicine

## 2024-01-27 DIAGNOSIS — K219 Gastro-esophageal reflux disease without esophagitis: Secondary | ICD-10-CM

## 2024-01-30 NOTE — Telephone Encounter (Signed)
 Requested Prescriptions  Pending Prescriptions Disp Refills   omeprazole  (PRILOSEC) 20 MG capsule [Pharmacy Med Name: OMEPRAZOLE  DR 20 MG CAPSULE] 90 capsule 0    Sig: TAKE 1 CAPSULE BY MOUTH EVERY DAY     Gastroenterology: Proton Pump Inhibitors Passed - 01/30/2024  4:05 PM      Passed - Valid encounter within last 12 months    Recent Outpatient Visits           1 week ago Type II diabetes mellitus with complication [facility])   [facility] at [facility], Chales [place], MD   3 months ago Type II diabetes mellitus with complication [facility])   [facility] at [facility], Chales [place], MD

## 2024-02-20 NOTE — Progress Notes (Addendum)
 Referring Physician:  Justus Leita DEL, MD [address] [place],  [place] [address]  Primary Physician:  Justus Leita DEL, MD  History of Present Illness: 02/21/2024 [name] has a history of DM, hyperlipidemia, GERD, HTN, and prostate CA.   I have seen him in the past for a C5 fracture s/p fall on 06/12/22. Last seen by me on 05/03/23 for his lower back.   He has intermittent neck pain that is worse when he tries to look up. He has stiffness and feels like he is looking down all the time. No arm pain. No numbness, tingling, or weakness.   Conservative measures:  Physical therapy: no recent PT for his neck. Did lumbar PT back in 2024 at [facility] Multimodal medical therapy including regular antiinflammatories: none  Injections: no epidural steroid injections  Past Surgery: none   Thomas Allen has no symptoms of cervical myelopathy.  The symptoms are causing a significant impact on the patient's life.   Review of Systems:  A 10 point review of systems is negative, except for the pertinent positives and negatives detailed in the HPI.  Past Medical History: Past Medical History:  Diagnosis Date   Arthritis    Cancer (HCC)    Diabetes mellitus without complication (HCC)    GERD (gastroesophageal reflux disease)    Haglund's deformity    History of kidney stones    Hyperlipidemia    Hypertension    Intractable hiccups 02/05/2017   Prostate CA (HCC)    Sleep apnea     Past Surgical History: Past Surgical History:  Procedure Laterality Date   APPENDECTOMY     COLONOSCOPY  2008   benign polyps   COLONOSCOPY WITH PROPOFOL  N/A 08/07/2017   Procedure: COLONOSCOPY WITH PROPOFOL ;  Surgeon: Gaylyn Gladis PENNER, MD;  Location[facility] END[facility]COPY;  Service: Endoscopy;  Laterality: N/A;   COLONOSCOPY WITH PROPOFOL  N/A 12/03/2020   Procedure: COLONOSCOPY WITH PROPOFOL ;  Surgeon: Maryruth Ole DASEN, MD;  Location: [facility] ENDOSCOPY;  Service: Endoscopy;  Laterality: N/A;    ESOPHAGOGASTRODUODENOSCOPY (EGD) WITH PROPOFOL  N/A 08/07/2017   Procedure: ESOPHAGOGASTRODUODENOSCOPY (EGD) WITH PROPOFOL ;  Surgeon: Gaylyn Gladis PENNER, MD;  Location: [facility] ENDOSCOPY;  Service: Endoscopy;  Laterality: N/A;   INSERTION OF MESH Right 01/16/2022   Procedure: INSERTION OF MESH;  Surgeon: Lane Shope, MD;  Location: [facility] ORS;  Service: General;  Laterality: Right;   lithopexy     PROSTATE BIOPSY  2012   malignant   REPLACEMENT TOTAL KNEE Bilateral    [id number]    Allergies: Allergies as of 02/21/2024 - Review Complete 02/21/2024  Allergen Reaction Noted   Ace inhibitors Cough 01/19/2016    Medications: Outpatient Encounter Medications as of 02/21/2024  Medication Sig   metFORMIN  (GLUCOPHAGE ) 1000 MG tablet Take 1 tablet (1,000 mg total) by mouth daily with breakfast.   Multiple Vitamin (MULTIVITAMIN) tablet Take 1 tablet by mouth daily.   omeprazole  (PRILOSEC) 20 MG capsule TAKE 1 CAPSULE BY MOUTH EVERY DAY   No facility-administered encounter medications on file as of 02/21/2024.    Social History: Social History   Tobacco Use   Smoking status: Former    Current packs/day: 0.00    Average packs/day: 2.5 packs/day for 16.0 years (40.0 ttl pk-yrs)    Types: Cigarettes    Start date: 50    Quit date: 69    Years since quitting: 49.4    Passive exposure: Past   Smokeless tobacco: Never  Vaping Use   Vaping  status: Never Used  Substance Use Topics   Alcohol use: Not Currently    Alcohol/week: 14.0 standard drinks of alcohol    Types: 14 Shots of liquor per week    Comment: 2 scotch drinks per night, last use 09/2023   Drug use: No    Family Medical History: Family History  Problem Relation Age of Onset   Diabetes Mother    Colon cancer Mother    Colon cancer Father    Prostate cancer Neg Hx    Bladder Cancer Neg Hx    Kidney cancer Neg Hx     Physical Examination: Vitals:   02/21/24 0911  BP: 120/66      Awake, alert, oriented to person,  place, and time.  Speech is clear and fluent. Fund of knowledge is appropriate.   Cranial Nerves: Pupils equal round and reactive to light.  Facial tone is symmetric.    He has limited ROM of cervical spine with extension.   Mild mid to lower posterior cervical tenderness. No tenderness in bilateral trapezial region.   No abnormal lesions on exposed skin.   Strength: Side Biceps Triceps Deltoid Interossei Grip Wrist Ext. Wrist Flex.  R 5 5 5 5 5 5 5   L 5 5 5 5 5 5 5    Side Iliopsoas Quads Hamstring PF DF EHL  R 5 5 5 5 5 5   L 5 5 5 5 5 5    Reflexes are 2+ and symmetric at the biceps, brachioradialis, patella and achilles.   Hoffman's is absent.  Clonus is not present.   Bilateral upper and lower extremity sensation is intact to light touch.   Reasonable ROM of both shoulders with no pain.   Gait is slow.   Medical Decision Making  Imaging: Cervical CT dated 07/16/23:  CT CERVICAL SPINE FINDINGS   Alignment: Stable. There is mild kyphosis at C4-5 level, similar to the prior study. This examination does not assess for ligamentous injury or stability.   Skull base and vertebrae: No acute fracture. No primary bone lesion or focal pathologic process.   Soft tissues and spinal canal: No prevertebral fluid or swelling. No visible canal hematoma.   Disc levels: Redemonstration of flowing anterior bridging enthesophytes, suggesting diffuse idiopathic skeletal hyperostosis (DISH). There are moderate multilevel degenerative changes characterized by reduced intervertebral disc height, most pronounced at C5-C6 and C6-C7 levels, along with facet arthropathy and osteophytes.   Upper chest: Negative.   Other: None.   IMPRESSION: 1. No acute intracranial abnormality. 2. No acute osseous abnormality of the cervical spine. 3. Multiple other nonacute observations, as described above.     Electronically Signed   By: Ree Molt M.D.   On: 07/16/2023 10:48  I have  personally reviewed the images and agree with the above interpretation.  Assessment and Plan: Mr. Cuff has history of C5 fracture s/p fall on 06/12/22.    He has intermittent neck pain that is worse when he tries to look up. He has stiffness and feels like he is looking down all the time. No arm pain. No numbness, tingling, or weakness.   Previous cervical CT from November shows kyphosis with diffuse spondylosis and DDD, probable DISH.   Treatment options discussed with patient and following plan made:   - Cervical xrays with flex/ext ordered.  - Will review these and message him.  - Will likely start PT at [facility] and if no improvement with that, consider MRI and possible injections.  - Will schedule follow  up when I message him about injections. He prefers afternoon appointments.   I spent a total of 25 minutes in face-to-face and non-face-to-face activities related to this patient's care today including review of outside records, review of imaging, review of symptoms, physical exam, discussion of differential diagnosis, discussion of treatment options, and documentation.   ADDENDUM 03/03/24:  Cervical xrays dated 02/21/24:  FINDINGS: Reversal of cervical lordosis, centered at C4-C5. Vertebral body heights are maintained. Flowing osteophytosis anteriorly. Mild disc space narrowing C4-C5 and C5-C6. 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     Electronically Signed   By: Luke Bun M.D.   On: 03/02/2024 16:49  I have personally reviewed the images and agree with the above interpretation.  Above xrays reviewed with Dr. Claudene as well.   Can consider PT, but this would not correct his deformity. He has a fixed cervical deformity secondary to angular kyphosis and then osteophyte growth locking it down.   If he is not having much  pain I would likely have him defer treatment until he cannot do anything else. Surgery option would be a major surgery (would have to fracture his neck and then put it back into place).    Will schedule phone visit with patient to discuss options.   Glade Boys PA-C Dept. of Neurosurgery

## 2024-02-21 ENCOUNTER — Ambulatory Visit (INDEPENDENT_AMBULATORY_CARE_PROVIDER_SITE_OTHER): Admitting: Orthopedic Surgery

## 2024-02-21 ENCOUNTER — Ambulatory Visit
Admission: RE | Admit: 2024-02-21 | Discharge: 2024-02-21 | Disposition: A | Discharge status: Home / Self Care | Attending: Orthopedic Surgery | Admitting: Orthopedic Surgery

## 2024-02-21 ENCOUNTER — Ambulatory Visit
Admission: RE | Admit: 2024-02-21 | Discharge: 2024-02-21 | Disposition: A | Discharge status: Home / Self Care | Source: Ambulatory Visit | Attending: Orthopedic Surgery | Admitting: Orthopedic Surgery

## 2024-02-21 ENCOUNTER — Encounter: Payer: Self-pay | Admitting: Orthopedic Surgery

## 2024-02-21 VITALS — BP 120/66 | Ht 68.0 in | Wt 200.8 lb

## 2024-02-21 DIAGNOSIS — M5030 Other cervical disc degeneration, unspecified cervical region: Secondary | ICD-10-CM | POA: Diagnosis not present

## 2024-02-21 DIAGNOSIS — M47812 Spondylosis without myelopathy or radiculopathy, cervical region: Secondary | ICD-10-CM

## 2024-02-21 DIAGNOSIS — M40202 Unspecified kyphosis, cervical region: Secondary | ICD-10-CM

## 2024-02-21 DIAGNOSIS — M4802 Spinal stenosis, cervical region: Secondary | ICD-10-CM | POA: Diagnosis not present

## 2024-02-21 DIAGNOSIS — M542 Cervicalgia: Secondary | ICD-10-CM | POA: Diagnosis not present

## 2024-03-04 NOTE — Progress Notes (Addendum)
 Referring Physician:  Justus Leita DEL, MD [address] [place],  [place] [address]  Primary Physician:  Justus Leita DEL, MD  History of Present Illness: [name] has a history of DM, hyperlipidemia, GERD, HTN, and prostate CA.   I have seen him in the past for a C5 fracture s/p fall on 06/12/22. Last seen by me on 02/21/24 for stiffness in his neck and limited ROM.   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  He is here to review xrays and discuss further treatment options.   He has no significant neck pain. Primary complaint is stiffness- he feels like he is looking down and can't look up. He feels some grinding in his neck at times. No arm pain. No numbness, tingling, or weakness in his arms.    Conservative measures:  Physical therapy: no recent PT for his neck. Did lumbar PT back in 2024 at [facility] including regular antiinflammatories: none  Injections: no epidural steroid injections  Past Surgery: none   Gokul N Mansour has no symptoms of cervical myelopathy.  The symptoms are causing a significant impact on the patient's life.   Review of Systems:  A 10 point review of systems is negative, except for the pertinent positives and negatives detailed in the HPI.  Past Medical History: Past Medical History:  Diagnosis Date   Arthritis    Cancer (HCC)    Diabetes mellitus without complication (HCC)    GERD (gastroesophageal reflux disease)    Haglund's deformity    History of kidney stones    Hyperlipidemia    Hypertension    Intractable hiccups 02/05/2017   Prostate CA (HCC)    Sleep apnea     Past Surgical History: Past Surgical History:  Procedure Laterality Date   APPENDECTOMY     COLONOSCOPY  2008   benign polyps   COLONOSCOPY WITH PROPOFOL  N/A 08/07/2017   Procedure: COLONOSCOPY WITH PROPOFOL ;  Surgeon: Gaylyn Gladis PENNER, MD;  Location: [facility] ENDOSCOPY;  Service: Endoscopy;  Laterality: N/A;   COLONOSCOPY WITH PROPOFOL  N/A 12/03/2020   Procedure: COLONOSCOPY WITH PROPOFOL ;  Surgeon: Maryruth Ole DASEN, MD;  Location: [facility] ENDOSCOPY;  Service: Endoscopy;  Laterality: N/A;   ESOPHAGOGASTRODUODENOSCOPY (EGD) WITH PROPOFOL  N/A 08/07/2017   Procedure: ESOPHAGOGASTRODUODENOSCOPY (EGD) WITH PROPOFOL ;  Surgeon: Gaylyn Gladis PENNER, MD;  Location: [facility] ENDOSCOPY;  Service: Endoscopy;  Laterality: N/A;   INSERTION OF MESH Right 01/16/2022   Procedure: INSERTION OF MESH;  Surgeon: Lane Shope, MD;  Location: [facility] ORS;  Service: General;  Laterality: Right;   lithopexy     PROSTATE BIOPSY  2012   malignant   REPLACEMENT TOTAL KNEE Bilateral    [id number]    Allergies: Allergies as of 03/05/2024 - Review Complete 02/21/2024  Allergen Reaction Noted   Ace inhibitors Cough 01/19/2016    Medications: Outpatient Encounter Medications as of 03/05/2024  Medication Sig   metFORMIN  (GLUCOPHAGE ) 1000 MG tablet Take 1 tablet (1,000 mg total) by mouth daily with breakfast.   Multiple Vitamin (MULTIVITAMIN) tablet Take 1 tablet by mouth daily.   omeprazole  (PRILOSEC) 20 MG capsule TAKE 1 CAPSULE BY MOUTH EVERY DAY   No facility-administered encounter medications on file as of 03/05/2024.    Social History: Social History   Tobacco Use   Smoking status: Former    Current packs/day: 0.00    Average packs/day: 2.5 packs/day for 16.0 years (40.0  ttl pk-yrs)    Types: Cigarettes    Start date: 56    Quit date: 54    Years since quitting: 49.5    Passive exposure: Past   Smokeless tobacco: Never  Vaping Use   Vaping status: Never Used  Substance Use Topics   Alcohol use: Not Currently    Alcohol/week: 14.0 standard drinks of alcohol    Types: 14 Shots of liquor per week    Comment: 2 scotch drinks per night, last use 09/2023   Drug use: No    Family Medical History: Family History  Problem Relation Age of  Onset   Diabetes Mother    Colon cancer Mother    Colon cancer Father    Prostate cancer Neg Hx    Bladder Cancer Neg Hx    Kidney cancer Neg Hx     Physical Examination: There were no vitals filed for this visit.  Awake, alert, oriented to person, place, and time.  Speech is clear and fluent. Fund of knowledge is appropriate.   Cranial Nerves: Pupils equal round and reactive to light.  Facial tone is symmetric.    He has limited ROM of cervical spine.  No abnormal lesions on exposed skin.   Strength: Side Biceps Triceps Deltoid Interossei Grip Wrist Ext. Wrist Flex.  R 5 5 5 5 5 5 5   L 5 5 5 5 5 5 5    Reflexes are 2+ and symmetric at the biceps, brachioradialis.  Hoffman's is absent.    Bilateral upper extremity sensation is intact to light touch.   Gait is slow.   Medical Decision Making  Imaging: Cervical xrays dated 02/21/24:  FINDINGS: Reversal of cervical lordosis, centered at C4-C5. Vertebral body heights are maintained. Flowing osteophytosis anteriorly. Mild disc space narrowing C4-C5 and C5-C6. 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     Electronically Signed   By: Luke Bun M.D.   On: 03/02/2024 16:49  I have personally reviewed the images and agree with the above interpretation. Above xrays reviewed with Dr. Claudene prior to his visit.   Assessment and Plan: Mr. Sainvil has history of C5 fracture s/p fall on 06/12/22.    He has intermittent neck pain that is worse when he tries to look up. He has stiffness and feels like he is looking down all the time. No arm pain. No numbness, tingling, or weakness.   He has a fixed cervical deformity secondary to angular kyphosis and with osteophyte growth locking it down. He has diffuse spondylosis and DDD, probable DISH.   Treatment options discussed with  patient and following plan made:   - Discussed that his neck stiffness is from fixed deformity. I do not think PT or injections will improve his ROM.  - If he develops pain, can revisit PT and possible cervical injections.  - Surgery option to correct this deformity would be a major surgery and is not recommended. He does not want surgery.   - If he develops neck pain, he will let me know. He will follow up prn.   I spent a total of 20 minutes in face-to-face and non-face-to-face activities related to this patient's care today including review of outside records, review of imaging, review of symptoms, physical exam, discussion of differential diagnosis, discussion of treatment options, and documentation.   Glade Boys PA-C Dept. of Neurosurgery

## 2024-03-05 ENCOUNTER — Encounter: Payer: Self-pay | Admitting: Orthopedic Surgery

## 2024-03-05 ENCOUNTER — Ambulatory Visit: Admitting: Orthopedic Surgery

## 2024-03-05 VITALS — BP 120/70 | Ht 68.0 in | Wt 200.0 lb

## 2024-03-05 DIAGNOSIS — M47812 Spondylosis without myelopathy or radiculopathy, cervical region: Secondary | ICD-10-CM

## 2024-03-05 DIAGNOSIS — M5030 Other cervical disc degeneration, unspecified cervical region: Secondary | ICD-10-CM

## 2024-03-05 DIAGNOSIS — M40202 Unspecified kyphosis, cervical region: Secondary | ICD-10-CM

## 2024-04-09 ENCOUNTER — Encounter: Payer: Self-pay | Admitting: Emergency Medicine

## 2024-04-09 ENCOUNTER — Telehealth: Payer: Self-pay

## 2024-04-09 ENCOUNTER — Ambulatory Visit: Payer: Self-pay

## 2024-04-09 ENCOUNTER — Other Ambulatory Visit: Payer: Self-pay

## 2024-04-09 ENCOUNTER — Emergency Department
Admission: EM | Admit: 2024-04-09 | Discharge: 2024-04-09 | Disposition: A | Discharge status: Home / Self Care | Attending: Emergency Medicine | Admitting: Emergency Medicine

## 2024-04-09 DIAGNOSIS — E119 Type 2 diabetes mellitus without complications: Secondary | ICD-10-CM | POA: Insufficient documentation

## 2024-04-09 DIAGNOSIS — I10 Essential (primary) hypertension: Secondary | ICD-10-CM | POA: Insufficient documentation

## 2024-04-09 DIAGNOSIS — R300 Dysuria: Secondary | ICD-10-CM | POA: Diagnosis not present

## 2024-04-09 DIAGNOSIS — Z8546 Personal history of malignant neoplasm of prostate: Secondary | ICD-10-CM | POA: Insufficient documentation

## 2024-04-09 DIAGNOSIS — R339 Retention of urine, unspecified: Secondary | ICD-10-CM | POA: Diagnosis not present

## 2024-04-09 LAB — URINALYSIS, ROUTINE W REFLEX MICROSCOPIC
Bilirubin Urine: NEGATIVE
Glucose, UA: NEGATIVE mg/dL
Ketones, ur: NEGATIVE mg/dL
Nitrite: NEGATIVE
Protein, ur: 100 mg/dL — AB
Specific Gravity, Urine: 1.014 (ref 1.005–1.030)
WBC, UA: >50 WBC/hpf (ref 0–5)
pH: 5 (ref 5.0–8.0)

## 2024-04-09 MED ORDER — CEFUROXIME AXETIL 500 MG PO TABS
500.0000 mg | ORAL_TABLET | Freq: Once | ORAL | Status: DC
  Filled 2024-04-09: qty 1

## 2024-04-09 MED ORDER — CEFUROXIME AXETIL 500 MG PO TABS: 500.0000 mg | ORAL_TABLET | Freq: Two times a day (BID) | ORAL | 0 refills | Status: DC

## 2024-04-09 NOTE — Telephone Encounter (Signed)
 Pt called the triage line stating he is having urinary frequency, pain, and when he urinates not much is coming out. Pt states these symptoms started 4 days ago and they are getting worse. Pt was advise that he would need to go to the ER, urgent care or contact his PCP to see if they had any openings. Informed pt that we wouldn't want him to be in pain until out next opening, which is not until next Thursday. Pt voiced understanding.

## 2024-04-09 NOTE — ED Provider Notes (Signed)
 [facility] Emergency Department Provider Note     Event Date/Time   First MD Initiated Contact with Patient 04/09/24 1648     (approximate)   History   Urinary Retention   HPI  Thomas Allen is a 83 y.o. male with a history of prostate cancer, DM 2, HTN, GERD, HLD, kidney stones, and OSA, presents to the ED at the advice of his PCP.  Patient called for evaluation with the primary provider, was advised that PCP was unavailable.  He reported 4 days of progressively worsening urinary urgency, frequency, and ultimately urinary retention.  He notes only small amounts of voided urine, with ongoing bladder pressure and fullness.  Patient denies any nausea, vomiting, or fevers.  No gross hematuria is reported.  Patient had a dental procedure this morning, and has started a course of amoxicillin , Tylenol , and hydrocodone/APAP 7.5 today.   Physical Exam   Triage Vital Signs: ED Triage Vitals [04/09/24 1641]  Encounter Vitals Group     BP (!) 144/73     Girls Systolic BP Percentile      Girls Diastolic BP Percentile      Boys Systolic BP Percentile      Boys Diastolic BP Percentile      Pulse Rate 95     Resp 18     Temp 97.7 F (36.5 C)     Temp Source Oral     SpO2 97 %     Weight 180 lb (81.6 kg)     Height 5' 8 (1.727 m)     Head Circumference      Peak Flow      Pain Score 1     Pain Loc      Pain Education      Exclude from Growth Chart     Most recent vital signs: Vitals:   04/09/24 1641  BP: (!) 144/73  Pulse: 95  Resp: 18  Temp: 97.7 F (36.5 C)  SpO2: 97%    General Awake, no distress. NAD HEENT NCAT. PERRL. EOMI. No rhinorrhea. Mucous membranes are moist.  CV:  Good peripheral perfusion.  RESP:  Normal effort.  ABD:  No distention. Soft, nontender GU:  deferred   ED Results / Procedures / Treatments   Labs (all labs ordered are listed, but only abnormal results are displayed) Labs Reviewed  URINALYSIS, ROUTINE W REFLEX  MICROSCOPIC - Abnormal; Notable for the following components:      Result Value   Color, Urine YELLOW (*)    APPearance CLOUDY (*)    Hgb urine dipstick MODERATE (*)    Protein, ur 100 (*)    Leukocytes,Ua LARGE (*)    Bacteria, UA RARE (*)    All other components within normal limits  URINE CULTURE     EKG   RADIOLOGY   No results found.   PROCEDURES:  Critical Care performed: No  Procedures   MEDICATIONS ORDERED IN ED: Medications - No data to display   IMPRESSION / MDM / ASSESSMENT AND PLAN / ED COURSE  I reviewed the triage vital signs and the nursing notes.                              Differential diagnosis includes, but is not limited to, BPH, acute urinary retention, other bladder outlet obstruction, kidney stone, UTI  Patient's presentation is most consistent with acute complicated illness / injury requiring diagnostic  workup.  Patient's diagnosis is consistent with acute urinary retention and dysuria.  Geriatric patient presents to the ED endorsing urinary retention, slow voiding, urgency, and frequency.  Patient with UA that evidence of leukocyturia and moderate hemoglobinuria.  UA also shows WBC clumps..  Patient will be advised to proceed with a current course of amoxicillin  from his dental provider started today.  Continue with that course pending urine culture.  Patient will be discharged home with a Foley catheter in place. Patient is to follow up with his PCP or urology as suggested, as needed or otherwise directed. Patient is given ED precautions to return to the ED for any worsening or new symptoms.   FINAL CLINICAL IMPRESSION(S) / ED DIAGNOSES   Final diagnoses:  Urinary retention  Dysuria     Rx / DC Orders   ED Discharge Orders          Ordered    cefUROXime  (CEFTIN ) 500 MG tablet  2 times daily with meals,   Status:  Discontinued        04/09/24 1755             Note:  This document was prepared using Dragon voice recognition  software and may include unintentional dictation errors.    Loyd Candida LULLA Aldona, PA-C 04/09/24 1831    Dorothyann Drivers, MD 04/09/24 [place]

## 2024-04-09 NOTE — ED Triage Notes (Signed)
 Patient to ED via POV for urinary retention. Pt reports hx of same. States ongoing x3-4 days intermittently.

## 2024-04-09 NOTE — Telephone Encounter (Signed)
 FYI Only or Action Required?: Action required by provider: update on patient condition and wanting a follow up call from provider.  Patient was last seen in primary care on 01/23/2024 by Justus Leita DEL, MD.  Called Nurse Triage reporting Dysuria and Urinary Problem.  Symptoms began several days ago.  Interventions attempted: Rest, hydration, or home remedies.  Symptoms are: gradually worsening.  Triage Disposition: Go to ED Now (Notify PCP)-patient refused-wanting to speak with PCP  Patient/caregiver understands and will follow disposition?: No, refuses disposition  Copied from CRM [id number]. Topic: Clinical - Medical Advice >> Apr 09, 2024  2:23 PM Tiffini S wrote: Reason for CRM: Patient spouse Deane called stating that patient is in a lot of pain/ feels like he has to urinate but cannot or is just a little- started four days ago and is getting worse. Reason for Disposition  [1] Unable to urinate (or only a few drops) > 4 hours AND [2] bladder feels very full (e.g., palpable bladder or strong urge to urinate)  Answer Assessment - Initial Assessment Questions 1. SYMPTOM: What's the main symptom you're concerned about? (e.g., frequency, incontinence)     Patient reports that he is having difficulty urinating. Patient reports when he does urinate only small amounts of urine come out . Pain and pressure to pelvic region due to not being able to go to the bathroom. Patient endorses urinary frequency and urgency but not able to go 2. ONSET: When did the  difficulty urinating  start?     Started initially 3-4 days ago 3. PAIN: Is there any pain? If Yes, ask: How bad is it? (Scale: 1-10; mild, moderate, severe)     5-6 out of 10 4. CAUSE: What do you think is causing the symptoms?     Patient is unsure of what could be causing this 5. OTHER SYMPTOMS: Do you have any other symptoms? (e.g., blood in urine, fever, flank pain, pain with urination)     Pain with urination.  Patient  endorses pelvic pain along with difficulty with urination and pain with urination. Patient states he is have urinary frequency and urgency. Patient did call urology and they are unable to see patient until next Thursday. Patient is recommended to Emergency Department but patient would like to speak to provider. Patient is needing a phone call.  Protocols used: Urinary Symptoms-A-AH

## 2024-04-09 NOTE — Telephone Encounter (Signed)
 Called pt told him to go to the ED. Pt verbalized understanding. Told pt Dr. Justus is not in the office and she will return on Monday, 04/14/24.   KP

## 2024-04-09 NOTE — Discharge Instructions (Addendum)
 You are being treated for urinary retention with a antibiotic you take twice daily.  The Foley catheter has been placed to help you pass urine easily.  You should follow-up with your primary provider as needed.  You should also follow with urology for reevaluation and removal of your Foley catheter.  Return to the ED if needed.

## 2024-04-09 NOTE — ED Notes (Signed)
Leg bag placed.

## 2024-04-10 LAB — URINE CULTURE: Culture: NO GROWTH

## 2024-04-13 NOTE — Progress Notes (Deleted)
 04/15/2024 9:04 PM   Thomas Allen, Thomas Allen [id number]  Referring provider: Justus Leita DEL, MD [address] [place],  [place] [address]  Urological history: 1. Metastatic prostate cancer - PSA (11/2023) <0.01 - followed by [facility]  2. Pyuria -cysto (12/2022) meatal stenosis/radiation changes - urine cytology (04/2023) negative   No chief complaint on file.   HPI: Thomas Allen is a 83 y.o. man who presents today for   Previous records reviewed.   He was seen in the ED on 04/09/2024 for urinary retention.  He presented after having 4 days of urinary frequency, pain and only voiding small amounts.  UA positive for heme and pyuria.  Urine culture was negative.  Foley catheter was placed.    Lab Results  Component Value Date   WBC 9.8 10/22/2023   HGB 12.9 (L) 10/22/2023   HCT 38.4 10/22/2023   MCV 97 10/22/2023   PLT 234 10/22/2023   Lab Results  Component Value Date   CREATININE 0.96 10/22/2023   Lab Results  Component Value Date   HGBA1C 6.1 (A) 01/23/2024    PMH: Past Medical History:  Diagnosis Date   Arthritis    Cancer (HCC)    Diabetes mellitus without complication (HCC)    GERD (gastroesophageal reflux disease)    Haglund's deformity    History of kidney stones    Hyperlipidemia    Hypertension    Intractable hiccups 02/05/2017   Prostate CA (HCC)    Sleep apnea     Surgical History: Past Surgical History:  Procedure Laterality Date   APPENDECTOMY     COLONOSCOPY  2008   benign polyps   COLONOSCOPY WITH PROPOFOL  N/A 08/07/2017   Procedure: COLONOSCOPY WITH PROPOFOL ;  Surgeon: Gaylyn Gladis PENNER, MD;  Location: [facility] ENDOSCOPY;  Service: Endoscopy;  Laterality: N/A;   COLONOSCOPY WITH PROPOFOL  N/A 12/03/2020   Procedure: COLONOSCOPY WITH PROPOFOL ;  Surgeon: Maryruth Ole DASEN, MD;  Location: [facility] ENDOSCOPY;  Service: Endoscopy;  Laterality: N/A;   ESOPHAGOGASTRODUODENOSCOPY (EGD) WITH PROPOFOL  N/A 08/07/2017   Procedure:  ESOPHAGOGASTRODUODENOSCOPY (EGD) WITH PROPOFOL ;  Surgeon: Gaylyn Gladis PENNER, MD;  Location: [facility] ENDOSCOPY;  Service: Endoscopy;  Laterality: N/A;   INSERTION OF MESH Right 01/16/2022   Procedure: INSERTION OF MESH;  Surgeon: Lane Shope, MD;  Location: [facility] ORS;  Service: General;  Laterality: Right;   lithopexy     PROSTATE BIOPSY  2012   malignant   REPLACEMENT TOTAL KNEE Bilateral    [id number]    Home Medications:  Allergies as of 04/15/2024       Reactions   Ace Inhibitors Cough        Medication List        Accurate as of April 13, 2024  9:04 PM. If you have any questions, ask your nurse or doctor.          metFORMIN  1000 MG tablet Commonly known as: GLUCOPHAGE  Take 1 tablet (1,000 mg total) by mouth daily with breakfast.   multivitamin tablet Take 1 tablet by mouth daily.   omeprazole  20 MG capsule Commonly known as: PRILOSEC TAKE 1 CAPSULE BY MOUTH EVERY DAY        Allergies:  Allergies  Allergen Reactions   Ace Inhibitors Cough    Family History: Family History  Problem Relation Age of Onset   Diabetes Mother    Colon cancer Mother    Colon cancer Father    Prostate cancer Neg Hx    Bladder Cancer  Neg Hx    Kidney cancer Neg Hx     Social History: See HPI for pertinent social history  ROS: Pertinent ROS in HPI  Physical Exam: There were no vitals taken for this visit.  Constitutional:  Well nourished. Alert and oriented, No acute distress. HEENT: [place] AT, moist mucus membranes.  Trachea midline, no masses. Cardiovascular: No clubbing, cyanosis, or edema. Respiratory: Normal respiratory effort, no increased work of breathing. GI: Abdomen is soft, non tender, non distended, no abdominal masses. Liver and spleen not palpable.  No hernias appreciated.  Stool sample for occult testing is not indicated.   GU: No CVA tenderness.  No bladder fullness or masses.  Patient with circumcised/uncircumcised phallus. ***Foreskin easily  retracted***  Urethral meatus is patent.  No penile discharge. No penile lesions or rashes. Scrotum without lesions, cysts, rashes and/or edema.  Testicles are located scrotally bilaterally. No masses are appreciated in the testicles. Left and right epididymis are normal. Rectal: Patient with  normal sphincter tone. Anus and perineum without scarring or rashes. No rectal masses are appreciated. Prostate is approximately *** grams, *** nodules are appreciated. Seminal vesicles are normal. Skin: No rashes, bruises or suspicious lesions. Lymph: No cervical or inguinal adenopathy. Neurologic: Grossly intact, no focal deficits, moving all 4 extremities. Psychiatric: Normal mood and affect.  Laboratory Data: See EPIC and HPI  I have reviewed the labs.   Pertinent Imaging: N/A  Assessment & Plan:  ***  1. Urinary retention - Foley placed in the ED - we could continue with monthly catheter changes or schedule a voiding trial.    2. Metastatic prostate cancer - PSA undetectable - follow by [facility]  No follow-ups on file.  These notes generated with voice recognition software. I apologize for typographical errors.  Thomas Allen  [facility] [address]  [address] [place], [place] [address] [phone]

## 2024-04-15 ENCOUNTER — Telehealth: Payer: Self-pay | Admitting: Internal Medicine

## 2024-04-15 ENCOUNTER — Ambulatory Visit: Admitting: Urology

## 2024-04-15 DIAGNOSIS — C61 Malignant neoplasm of prostate: Secondary | ICD-10-CM

## 2024-04-15 DIAGNOSIS — R338 Other retention of urine: Secondary | ICD-10-CM

## 2024-04-15 NOTE — Telephone Encounter (Signed)
 Patient informed and verbalized understanding

## 2024-04-15 NOTE — Telephone Encounter (Signed)
 Copied from CRM #[id number]. Topic: Clinical - Medication Question >> Apr 15, 2024  2:35 PM Mia F wrote: Reason for CRM: Had a tooth surgically extracted. Dentist gave an antibiotic. Next day had an urinary blockage. Went to the ER and got an Arts development officer and antibiotic. He told them he was already taking an antibiotic from the dentist. He was advised to stick with the one he had from the dentist and not to start new med. Pt says he has two days left on the meds from the dentist. He see urology on 8/28. He wants to know if he need to start the next antibirotic after finishing the one from the dentist. Please advise

## 2024-04-16 ENCOUNTER — Ambulatory Visit: Admitting: Urology

## 2024-04-21 ENCOUNTER — Telehealth: Payer: Self-pay

## 2024-04-21 NOTE — Telephone Encounter (Signed)
 Patient called with C/O leg bag leaking. I told him to come in a we can exchange bag and give him more supplies. He also had c/o his glands of penis being sore from catheter and his underwear. I told him he could apply some coconut oil to help sooth the irritation. Pt stated compliance.

## 2024-04-25 ENCOUNTER — Telehealth: Payer: Self-pay | Admitting: Urology

## 2024-04-25 NOTE — Telephone Encounter (Signed)
 Patients wife called stating that patient is still having pain and discomfort at tip of his penis. She is wanting to know if patient can come in and be given more lidocaine  or can a prescription be called to pharmacy? Please advise.

## 2024-04-30 NOTE — Progress Notes (Unsigned)
 05/01/2024 9:18 PM   DYSHAUN BONZO Sep 23, 1940 [id number]  Referring provider: Justus Leita DEL, MD [address] [place],  [place] [address]  Urological history: 1. Metastatic prostate cancer - PSA (11/2023) <0.01 - followed by [facility]  2. Pyuria -cysto (12/2022) meatal stenosis/radiation changes - urine cytology (04/2023) negative  No chief complaint on file.  HPI: Thomas Allen is a 83 y.o. man who presents today for ED follow up.    Previous records reviewed.   He was seen in the ED on 04/09/2024 for urinary retention.  He presented after having 4 days of urinary frequency, pain and only voiding small amounts.  UA positive for heme and pyuria.  Urine culture was negative.  Foley catheter was placed.    Lab Results  Component Value Date   WBC 9.8 10/22/2023   HGB 12.9 (L) 10/22/2023   HCT 38.4 10/22/2023   MCV 97 10/22/2023   PLT 234 10/22/2023   Lab Results  Component Value Date   CREATININE 0.96 10/22/2023   Lab Results  Component Value Date   HGBA1C 6.1 (A) 01/23/2024    PMH: Past Medical History:  Diagnosis Date   Arthritis    Cancer (HCC)    Diabetes mellitus without complication (HCC)    GERD (gastroesophageal reflux disease)    Haglund's deformity    History of kidney stones    Hyperlipidemia    Hypertension    Intractable hiccups 02/05/2017   Prostate CA (HCC)    Sleep apnea     Surgical History: Past Surgical History:  Procedure Laterality Date   APPENDECTOMY     COLONOSCOPY  2008   benign polyps   COLONOSCOPY WITH PROPOFOL  N/A 08/07/2017   Procedure: COLONOSCOPY WITH PROPOFOL ;  Surgeon: Gaylyn Gladis PENNER, MD;  Location: [facility] ENDOSCOPY;  Service: Endoscopy;  Laterality: N/A;   COLONOSCOPY WITH PROPOFOL  N/A 12/03/2020   Procedure: COLONOSCOPY WITH PROPOFOL ;  Surgeon: Maryruth Ole DASEN, MD;  Location: [facility] ENDOSCOPY;  Service: Endoscopy;  Laterality: N/A;   ESOPHAGOGASTRODUODENOSCOPY (EGD) WITH PROPOFOL  N/A 08/07/2017    Procedure: ESOPHAGOGASTRODUODENOSCOPY (EGD) WITH PROPOFOL ;  Surgeon: Gaylyn Gladis PENNER, MD;  Location: [facility] ENDOSCOPY;  Service: Endoscopy;  Laterality: N/A;   INSERTION OF MESH Right 01/16/2022   Procedure: INSERTION OF MESH;  Surgeon: Lane Shope, MD;  Location: [facility] ORS;  Service: General;  Laterality: Right;   lithopexy     PROSTATE BIOPSY  2012   malignant   REPLACEMENT TOTAL KNEE Bilateral    [id number]    Home Medications:  Allergies as of 05/01/2024       Reactions   Ace Inhibitors Cough        Medication List        Accurate as of April 30, 2024  9:18 PM. If you have any questions, ask your nurse or doctor.          metFORMIN  1000 MG tablet Commonly known as: GLUCOPHAGE  Take 1 tablet (1,000 mg total) by mouth daily with breakfast.   multivitamin tablet Take 1 tablet by mouth daily.   omeprazole  20 MG capsule Commonly known as: PRILOSEC TAKE 1 CAPSULE BY MOUTH EVERY DAY        Allergies:  Allergies  Allergen Reactions   Ace Inhibitors Cough    Family History: Family History  Problem Relation Age of Onset   Diabetes Mother    Colon cancer Mother    Colon cancer Father    Prostate cancer Neg Hx  Bladder Cancer Neg Hx    Kidney cancer Neg Hx     Social History: See HPI for pertinent social history  ROS: Pertinent ROS in HPI  Physical Exam: There were no vitals taken for this visit.  Constitutional:  Well nourished. Alert and oriented, No acute distress. HEENT: [place] AT, moist mucus membranes.  Trachea midline, no masses. Cardiovascular: No clubbing, cyanosis, or edema. Respiratory: Normal respiratory effort, no increased work of breathing. GI: Abdomen is soft, non tender, non distended, no abdominal masses. Liver and spleen not palpable.  No hernias appreciated.  Stool sample for occult testing is not indicated.   GU: No CVA tenderness.  No bladder fullness or masses.  Patient with circumcised/uncircumcised phallus. ***Foreskin easily  retracted***  Urethral meatus is patent.  No penile discharge. No penile lesions or rashes. Scrotum without lesions, cysts, rashes and/or edema.  Testicles are located scrotally bilaterally. No masses are appreciated in the testicles. Left and right epididymis are normal. Rectal: Patient with  normal sphincter tone. Anus and perineum without scarring or rashes. No rectal masses are appreciated. Prostate is approximately *** grams, *** nodules are appreciated. Seminal vesicles are normal. Skin: No rashes, bruises or suspicious lesions. Lymph: No cervical or inguinal adenopathy. Neurologic: Grossly intact, no focal deficits, moving all 4 extremities. Psychiatric: Normal mood and affect.  Laboratory Data: See EPIC and HPI  I have reviewed the labs.   Pertinent Imaging: N/A  Assessment & Plan:  ***  1. Urinary retention - Foley placed in the ED - we could continue with monthly catheter changes or schedule a voiding trial.    2. Metastatic prostate cancer - PSA undetectable - follow by [facility]  No follow-ups on file.  These notes generated with voice recognition software. I apologize for typographical errors.  CLOTILDA HELON RIGGERS  [facility] [address]  [address] [place], [place] [address] [phone]

## 2024-05-01 ENCOUNTER — Ambulatory Visit: Admitting: Urology

## 2024-05-01 ENCOUNTER — Encounter: Payer: Self-pay | Admitting: Urology

## 2024-05-01 ENCOUNTER — Ambulatory Visit (INDEPENDENT_AMBULATORY_CARE_PROVIDER_SITE_OTHER): Admitting: Urology

## 2024-05-01 VITALS — BP 122/80 | HR 78 | Ht 68.0 in | Wt 182.0 lb

## 2024-05-01 DIAGNOSIS — R300 Dysuria: Secondary | ICD-10-CM

## 2024-05-01 DIAGNOSIS — R339 Retention of urine, unspecified: Secondary | ICD-10-CM

## 2024-05-01 DIAGNOSIS — Z466 Encounter for fitting and adjustment of urinary device: Secondary | ICD-10-CM

## 2024-05-01 LAB — BLADDER SCAN AMB NON-IMAGING

## 2024-05-01 MED ORDER — TAMSULOSIN HCL 0.4 MG PO CAPS: 0.4000 mg | ORAL_CAPSULE | Freq: Every day | ORAL | 3 refills | Status: AC

## 2024-05-01 MED ORDER — CEFUROXIME AXETIL 500 MG PO TABS: 500.0000 mg | ORAL_TABLET | Freq: Two times a day (BID) | ORAL | 0 refills | Status: DC

## 2024-05-02 LAB — MICROSCOPIC EXAMINATION
RBC, Urine: >30 /HPF — AB (ref 0–2)
WBC, UA: >30 /HPF — AB (ref 0–5)

## 2024-05-02 LAB — URINALYSIS, COMPLETE
Bilirubin, UA: NEGATIVE
Glucose, UA: NEGATIVE
Ketones, UA: NEGATIVE
Nitrite, UA: POSITIVE — AB
Protein,UA: NEGATIVE
Specific Gravity, UA: 1.015 (ref 1.005–1.030)
Urobilinogen, Ur: 0.2 mg/dL (ref 0.2–1.0)
pH, UA: 6 (ref 5.0–7.5)

## 2024-05-03 ENCOUNTER — Other Ambulatory Visit: Payer: Self-pay

## 2024-05-03 ENCOUNTER — Emergency Department
Admission: EM | Admit: 2024-05-03 | Discharge: 2024-05-03 | Disposition: A | Discharge status: Home / Self Care | Attending: Emergency Medicine | Admitting: Emergency Medicine

## 2024-05-03 DIAGNOSIS — Z8546 Personal history of malignant neoplasm of prostate: Secondary | ICD-10-CM | POA: Diagnosis not present

## 2024-05-03 DIAGNOSIS — T839XXA Unspecified complication of genitourinary prosthetic device, implant and graft, initial encounter: Secondary | ICD-10-CM

## 2024-05-03 DIAGNOSIS — Y732 Prosthetic and other implants, materials and accessory gastroenterology and urology devices associated with adverse incidents: Secondary | ICD-10-CM | POA: Insufficient documentation

## 2024-05-03 DIAGNOSIS — T83098A Other mechanical complication of other indwelling urethral catheter, initial encounter: Secondary | ICD-10-CM | POA: Insufficient documentation

## 2024-05-03 DIAGNOSIS — T83091A Other mechanical complication of indwelling urethral catheter, initial encounter: Secondary | ICD-10-CM | POA: Diagnosis not present

## 2024-05-03 DIAGNOSIS — I10 Essential (primary) hypertension: Secondary | ICD-10-CM | POA: Diagnosis not present

## 2024-05-03 DIAGNOSIS — E119 Type 2 diabetes mellitus without complications: Secondary | ICD-10-CM | POA: Insufficient documentation

## 2024-05-03 NOTE — ED Notes (Signed)
 Stat lock placed and urinary bag secured in place to pt's comfort.

## 2024-05-03 NOTE — ED Provider Notes (Signed)
 [facility] Provider Note    Event Date/Time   First MD Initiated Contact with Patient 05/03/24 1017     (approximate)   History   needs new leg bag for foley   HPI  Thomas Allen is a 83 y.o. male  with a past medical history of type 2 diabetes, prostate cancer, hypertension, sleep apnea, HLD, Hx nephrolithiasis presents to the emergency department with concern regarding his urinary catheter leg bag placed on left leg.  Patient states the bag keeps falling down his leg and pulls on his penile area, attempted taping it at home but it did not work.  Denies dysuria, urinary retention nausea, vomiting, fever, chills, abdominal pain, flank pain, back pain, gross hematuria.  States he has been able to urinate multiple times per day. Patient reports being seen by a urologist 2 days ago, had a foley cath placed and states the leg bag keeps falling down. Has follow up appointment scheduled on 9/4.    Physical Exam   Triage Vital Signs: ED Triage Vitals  Encounter Vitals Group     BP 05/03/24 1005 131/69     Girls Systolic BP Percentile --      Girls Diastolic BP Percentile --      Boys Systolic BP Percentile --      Boys Diastolic BP Percentile --      Pulse Rate 05/03/24 1005 85     Resp 05/03/24 1005 20     Temp 05/03/24 1005 98 F (36.7 C)     Temp Source 05/03/24 1005 Oral     SpO2 05/03/24 1005 100 %     Weight 05/03/24 1006 180 lb (81.6 kg)     Height 05/03/24 1006 5' 8 (1.727 m)     Head Circumference --      Peak Flow --      Pain Score 05/03/24 1005 0     Pain Loc --      Pain Education --      Exclude from Growth Chart --     Most recent vital signs: Vitals:   05/03/24 1015 05/03/24 1057  BP:  128/70  Pulse:  82  Resp:  18  Temp:    SpO2: 100% 100%    General: Awake, in no acute distress. Appears stated age. Head: Normocephalic, atraumatic. Neck: Supple. CV: Good peripheral perfusion. No edema.  Respiratory:Normal respiratory  effort.  No respiratory distress.  GI: Soft, non-distended, non-tender.  MSK: Moving all extremities with ease. Skin:Warm, dry, intact. No rashes, lesions, or ecchymosis. No cyanosis or pallor. Neurological: A&Ox4 to person, place, time, and situation.  GU: 16 FR foley catheter placed with leg bag attached (no tape) to left thigh. Bag filled with yellow urine, no gross hematuria. Chaperone paramedic present at time of exam.  ED Results / Procedures / Treatments   Labs (all labs ordered are listed, but only abnormal results are displayed) Labs Reviewed - No data to display   EKG     RADIOLOGY    PROCEDURES:  Critical Care performed: No   Procedures   MEDICATIONS ORDERED IN ED: Medications - No data to display   IMPRESSION / MDM / ASSESSMENT AND PLAN / ED COURSE  I reviewed the triage vital signs and the nursing notes.                              Differential diagnosis includes, but is  not limited to, foley catheter bag displacement, UTI, nephrolithiasis, acute urinary retention  Patient's presentation is most consistent with acute, uncomplicated illness.  Patient is an 83 year old male presenting with problem with his urinary catheter leg bag staying in place.  Reviewed note from encounter with urology on 8/28, had a Foley catheter placed, started on tamsulosin  and Ceftin , UA w/ 3+ ZBCs, nitrites, 2+ leukocytes and urine sent for culture, still pending.  Had paramedic Rosina Charleston place a StatLock and patient states bag feels much more comfortable at this time.  Bag is draining appropriately with no urine backing up into his penis at this time. No flank pain, back pain, fever, chills, N/V. Discussed continuing his prescribed antibiotics. He has a follow-up appointment scheduled in roughly 5 days with urology.  The patient may return to the emergency department for any new, worsening, or concerning symptoms. Patient was given the opportunity to ask questions; all  questions were answered. Emergency department return precautions were discussed with the patient.  Patient is in agreement to the treatment plan.  Patient is stable for discharge.    FINAL CLINICAL IMPRESSION(S) / ED DIAGNOSES   Final diagnoses:  Foley catheter problem, initial encounter [facility])     Rx / DC Orders   ED Discharge Orders     None        Note:  This document was prepared using Dragon voice recognition software and may include unintentional dictation errors.     Sheron Salm, PA-C 05/03/24 1136    Dicky Anes, MD 05/03/24 1332

## 2024-05-03 NOTE — ED Triage Notes (Signed)
 Pt to ED, POV for problem with urinary catheter leg bag. States the leg bag falls down his leg. Pt states he needs more elastic. He tried taping it and it didn't work. No pain. States the bag was choked off last night because was falling down his leg and then after this it drained fine. Foley last changed 3 days ago at Urology.

## 2024-05-03 NOTE — Discharge Instructions (Addendum)
 You have been seen in the Emergency Department (ED) today for foley catheter problem.  You are also being treated for a urinary tract infection (UTI).  Please take your antibiotic as prescribed from the urology office and over-the-counter pain medication (Tylenol  or Motrin ) as needed, but no more than recommended on the label instructions.  Drink PLENTY of fluids. Please follow up with your urologist as scheduled on 9/4.  Return immediately to the ED if your pain worsens, you have decreased urine production, develop fever, persistent vomiting, or other symptoms that concern you.

## 2024-05-06 ENCOUNTER — Ambulatory Visit: Payer: Self-pay | Admitting: Urology

## 2024-05-06 LAB — CULTURE, URINE COMPREHENSIVE

## 2024-05-07 ENCOUNTER — Other Ambulatory Visit: Payer: Self-pay

## 2024-05-07 DIAGNOSIS — C61 Malignant neoplasm of prostate: Secondary | ICD-10-CM | POA: Diagnosis not present

## 2024-05-07 LAB — BASIC METABOLIC PANEL WITH GFR
BUN: 18 (ref 4–21)
CO2: 25 — AB (ref 13–22)
Chloride: 103 (ref 99–108)
Creatinine: 1 (ref 0.6–1.3)
Glucose: 134
Potassium: 4.7 meq/L (ref 3.5–5.1)
Sodium: 136 — AB (ref 137–147)

## 2024-05-07 LAB — PSA: PSA: <0.01

## 2024-05-07 LAB — HEPATIC FUNCTION PANEL
ALT: 19 U/L (ref 10–40)
AST: 17 (ref 14–40)
Bilirubin, Total: 0.6

## 2024-05-07 LAB — COMPREHENSIVE METABOLIC PANEL WITH GFR
Calcium: 9.5 (ref 8.7–10.7)
eGFR: 75

## 2024-05-07 LAB — CBC AND DIFFERENTIAL
HCT: 39 — AB (ref 41–53)
Hemoglobin: 12.8 — AB (ref 13.5–17.5)
Platelets: 256 K/uL (ref 150–400)
WBC: 7

## 2024-05-07 MED ORDER — LEVOFLOXACIN 250 MG PO TABS: 250.0000 mg | ORAL_TABLET | Freq: Every day | ORAL | 0 refills | Status: DC

## 2024-05-07 NOTE — Progress Notes (Signed)
 05/08/2024 4:51 PM   Thomas Allen [birth year] [id number]  Referring provider: Justus Leita DEL, MD [address] [place],  [place] [address]  Urological history: 1. Metastatic prostate cancer - PSA (11/2023) <0.01 - followed by [facility]  2. Pyuria -cysto (12/2022) meatal stenosis/radiation changes -urine cytology (04/2023) negative  Chief Complaint  Patient presents with   Urinary Retention   HPI: Thomas Allen is a 83 y.o. man who presents today for trial of void with his wife, Thomas Allen.    Previous records reviewed.   At his visit with me on May 01, 2024, he was seen in follow up from his ED visit on 04/09/2024 for urinary retention.  He presented after having 4 days of urinary frequency, pain and only voiding small amounts.  UA positive for heme and pyuria.  Urine culture was negative.  Foley catheter was placed.   He is not a robust historian, but he contacted our office on April 09, 2024 with symptoms of urinary frequency, pain and when he was voiding not much urine was coming out.  He was seen in emergency room where Foley catheter was placed.  There is no documentation on a bladder scan or the amount of urine that was obtained when the catheter was placed.  Thomas Allen said that there wasn't much urine obtained.  He has tolerated the Foley catheter except for the rubbing of the catheter on his penis.   Foley was removed.   He went home and drank 72 ounces worth of water .  He was only able to void very small amounts and at one time he saw blood.  PVR 231 mL.   He is uncomfortable with a strong urge to void and pain on the tip of his penis.  Patient denies any modifying or aggravating factors.  Patient denies any recent UTI's or suprapubic/flank pain.  Patient denies any fevers, chills, nausea or vomiting.   UA positive for nitrates, greater than 30 WBCs, greater than 30 RBCs and many bacteria.  He failed his voiding trial and was started on Ceftin .   His urine culture was  positive for enterobacter cloacae complex and he needed to be switched to Levaquin .    He was seen back in the ED on 05/03/2024 for issues with his leg bag kept falling down.    Foley catheter was removed this am and he is voiding well.  He has one day left of his antibiotic, but he is not sure if it is the Ceftin  or the Levaquin .   Patient denies any modifying or aggravating factors.  Patient denies any recent UTI's, gross hematuria, dysuria or suprapubic/flank pain.  Patient denies any fevers, chills, nausea or vomiting.    His PVR this afternoon is 154 mL.   Lab Results  Component Value Date   WBC 9.8 10/22/2023   HGB 12.9 (L) 10/22/2023   HCT 38.4 10/22/2023   MCV 97 10/22/2023   PLT 234 10/22/2023   Lab Results  Component Value Date   CREATININE 0.96 10/22/2023   Lab Results  Component Value Date   HGBA1C 6.1 (A) 01/23/2024    PMH: Past Medical History:  Diagnosis Date   Arthritis    Cancer (HCC)    Diabetes mellitus without complication (HCC)    GERD (gastroesophageal reflux disease)    Haglund's deformity    History of kidney stones    Hyperlipidemia    Hypertension    Intractable hiccups 02/05/2017   Prostate CA (  HCC)    Sleep apnea     Surgical History: Past Surgical History:  Procedure Laterality Date   APPENDECTOMY     COLONOSCOPY  2008   benign polyps   COLONOSCOPY WITH PROPOFOL  N/A 08/07/2017   Procedure: COLONOSCOPY WITH PROPOFOL ;  Surgeon: Gaylyn Gladis PENNER, MD;  Location: [facility] ENDOSCOPY;  Service: Endoscopy;  Laterality: N/A;   COLONOSCOPY WITH PROPOFOL  N/A 12/03/2020   Procedure: COLONOSCOPY WITH PROPOFOL ;  Surgeon: Maryruth Ole DASEN, MD;  Location: [facility] ENDOSCOPY;  Service: Endoscopy;  Laterality: N/A;   ESOPHAGOGASTRODUODENOSCOPY (EGD) WITH PROPOFOL  N/A 08/07/2017   Procedure: ESOPHAGOGASTRODUODENOSCOPY (EGD) WITH PROPOFOL ;  Surgeon: Gaylyn Gladis PENNER, MD;  Location: [facility] ENDOSCOPY;  Service: Endoscopy;  Laterality: N/A;   INSERTION OF MESH  Right 01/16/2022   Procedure: INSERTION OF MESH;  Surgeon: Lane Shope, MD;  Location: [facility] ORS;  Service: General;  Laterality: Right;   lithopexy     PROSTATE BIOPSY  2012   malignant   REPLACEMENT TOTAL KNEE Bilateral    [id number]    Home Medications:  Allergies as of 05/08/2024       Reactions   Ace Inhibitors Cough        Medication List        Accurate as of May 08, 2024 11:59 PM. If you have any questions, ask your nurse or doctor.          cefUROXime  500 MG tablet Commonly known as: CEFTIN  Take 1 tablet (500 mg total) by mouth 2 (two) times daily with a meal.   levofloxacin  250 MG tablet Commonly known as: Levaquin  Take 1 tablet (250 mg total) by mouth daily.   metFORMIN  1000 MG tablet Commonly known as: GLUCOPHAGE  Take 1 tablet (1,000 mg total) by mouth daily with breakfast.   multivitamin tablet Take 1 tablet by mouth daily.   tamsulosin  0.4 MG Caps capsule Commonly known as: FLOMAX  Take 1 capsule (0.4 mg total) by mouth daily.        Allergies:  Allergies  Allergen Reactions   Ace Inhibitors Cough    Family History: Family History  Problem Relation Age of Onset   Diabetes Mother    Colon cancer Mother    Colon cancer Father    Prostate cancer Neg Hx    Bladder Cancer Neg Hx    Kidney cancer Neg Hx     Social History: See HPI for pertinent social history  ROS: Pertinent ROS in HPI  Physical Exam: BP 118/78   Pulse 79   Ht 5' 8 (1.727 m)   Wt 182 lb (82.6 kg)   SpO2 99%   BMI 27.67 kg/m   Constitutional:  Well nourished. Alert and oriented, No acute distress. HEENT: [place] AT, moist mucus membranes.  Trachea midline Cardiovascular: No clubbing, cyanosis, or edema. Respiratory: Normal respiratory effort, no increased work of breathing. Neurologic: Grossly intact, no focal deficits, moving all 4 extremities. Psychiatric: Normal mood and affect.   Laboratory Data: See EPIC and HPI  I have reviewed the  labs.   Pertinent Imaging:  05/08/24 16:08  Scan Result 154    Assessment & Plan:    1. Urinary retention - Patient passed voiding trial today - he will continue the tamsulosin  0.4 mg daily  2. Enterobacter cloacae UTI - he will check when he gets home to see what antibiotic is he taking and we will call him in the am  3. Metastatic prostate cancer - PSA undetectable - follow by [facility]  Return for keep follow  up appointment at [facility]  These notes generated with voice recognition software. I apologize for typographical errors.  Thomas Allen  [facility] [address]  [address] [place], [place] [address] [phone]  Next morning:  Spoke with Thomas Allen and he was not taking the Levaquin , so I resent the prescription.  He states he is still feeling like he is empyting his bladder.  He is having a little urge incontinence which I explained was normal.   He also asked about yogurt, but I advised him that calcium can cause the Levaquin  less effective, so to space it out by at least 8 hours.

## 2024-05-08 ENCOUNTER — Encounter: Payer: Self-pay | Admitting: Urology

## 2024-05-08 ENCOUNTER — Ambulatory Visit: Admitting: Urology

## 2024-05-08 VITALS — BP 118/78 | HR 79 | Ht 68.0 in | Wt 182.0 lb

## 2024-05-08 DIAGNOSIS — A498 Other bacterial infections of unspecified site: Secondary | ICD-10-CM | POA: Diagnosis not present

## 2024-05-08 DIAGNOSIS — C61 Malignant neoplasm of prostate: Secondary | ICD-10-CM | POA: Diagnosis not present

## 2024-05-08 DIAGNOSIS — R338 Other retention of urine: Secondary | ICD-10-CM

## 2024-05-08 LAB — BLADDER SCAN AMB NON-IMAGING: Scan Result: 154

## 2024-05-08 NOTE — Progress Notes (Signed)
 Catheter Removal  Patient is present today for a catheter removal.  9ml of water  was drained from the balloon. A 16FR foley cath was removed from the bladder, no complications were noted. Patient tolerated well.  Performed by: Laymon Merrell, CMA  Follow up/ Additional notes: return this afternoon at 4:00pm

## 2024-05-09 ENCOUNTER — Other Ambulatory Visit: Payer: Self-pay | Admitting: Urology

## 2024-05-09 MED ORDER — LEVOFLOXACIN 250 MG PO TABS: 250.0000 mg | ORAL_TABLET | Freq: Every day | ORAL | 0 refills | Status: DC

## 2024-05-26 ENCOUNTER — Encounter: Payer: Self-pay | Admitting: Internal Medicine

## 2024-05-26 ENCOUNTER — Ambulatory Visit (INDEPENDENT_AMBULATORY_CARE_PROVIDER_SITE_OTHER): Admitting: Internal Medicine

## 2024-05-26 VITALS — BP 122/68 | HR 81 | Ht 68.0 in | Wt 194.0 lb

## 2024-05-26 DIAGNOSIS — I10 Essential (primary) hypertension: Secondary | ICD-10-CM | POA: Diagnosis not present

## 2024-05-26 DIAGNOSIS — E118 Type 2 diabetes mellitus with unspecified complications: Secondary | ICD-10-CM | POA: Diagnosis not present

## 2024-05-26 DIAGNOSIS — Z7984 Long term (current) use of oral hypoglycemic drugs: Secondary | ICD-10-CM

## 2024-05-26 DIAGNOSIS — S39012A Strain of muscle, fascia and tendon of lower back, initial encounter: Secondary | ICD-10-CM | POA: Diagnosis not present

## 2024-05-26 LAB — POCT GLYCOSYLATED HEMOGLOBIN (HGB A1C): Hemoglobin A1C: 5.7 % — AB (ref 4.0–5.6)

## 2024-05-26 MED ORDER — CYCLOBENZAPRINE HCL 5 MG PO TABS: 5.0000 mg | ORAL_TABLET | Freq: Three times a day (TID) | ORAL | 0 refills | Status: DC | PRN

## 2024-05-26 NOTE — Assessment & Plan Note (Addendum)
 Blood sugars have been stable.  No hypoglycemic events since last visit. Currently medications are MTF but he has not taken any in the past month. Last visit medical regimen changes were none. Lab Results  Component Value Date   HGBA1C 6.1 (A) 01/23/2024  A1C today =  5.7.  Will remain off of metformin . Recommend follow up in 4 months with new PCP.

## 2024-05-26 NOTE — Assessment & Plan Note (Signed)
 BP controlled on no medications.

## 2024-05-26 NOTE — Patient Instructions (Signed)
 Tylenol  500 mg - 2 tabs three times a day.  Use heat several times per day.  Take cyclobenzaprine  5 mg at bedtime.

## 2024-05-26 NOTE — Progress Notes (Signed)
 Date:  05/26/2024   Name:  Thomas Allen   DOB:  1940/12/18   MRN:  [id number]   Chief Complaint: Diabetes and Back Pain (X5-6 days of lower left bag pain. Pt lifted a heavy rock at home, and wanted to discuss treatment for this.)  Diabetes He presents for his follow-up diabetic visit. He has type 2 diabetes mellitus. Pertinent negatives for hypoglycemia include no headaches or tremors. Pertinent negatives for diabetes include no chest pain, no fatigue, no polydipsia and no polyuria. Current diabetic treatment includes oral agent (monotherapy) (MTF). He is compliant with treatment all of the time.  Back Pain This is a new problem. The current episode started in the past 7 days. The problem occurs constantly. Pertinent negatives include no abdominal pain, chest pain, dysuria, headaches or numbness.    Review of Systems  Constitutional:  Negative for appetite change, fatigue and unexpected weight change.  Eyes:  Negative for visual disturbance.  Respiratory:  Negative for cough, shortness of breath and wheezing.   Cardiovascular:  Negative for chest pain, palpitations and leg swelling.  Gastrointestinal:  Negative for abdominal pain and blood in stool.  Endocrine: Negative for polydipsia and polyuria.  Genitourinary:  Negative for dysuria and hematuria.  Musculoskeletal:  Positive for back pain.  Skin:  Negative for color change and rash.  Neurological:  Negative for tremors, numbness and headaches.  Psychiatric/Behavioral:  Negative for dysphoric mood.      Lab Results  Component Value Date   NA 136 (A) 05/07/2024   K 4.7 05/07/2024   CO2 25 (A) 05/07/2024   GLUCOSE 107 (H) 10/22/2023   BUN 18 05/07/2024   CREATININE 1.0 05/07/2024   CALCIUM 9.5 05/07/2024   EGFR 75 05/07/2024   GFRNONAA >60 07/16/2023   Lab Results  Component Value Date   CHOL 189 10/22/2023   HDL 38 (L) 10/22/2023   LDLCALC 111 (H) 10/22/2023   TRIG 228 (H) 10/22/2023   CHOLHDL 5.0 10/22/2023   Lab  Results  Component Value Date   TSH 2.000 10/22/2023   Lab Results  Component Value Date   HGBA1C 5.7 (A) 05/26/2024   Lab Results  Component Value Date   WBC 7.0 05/07/2024   HGB 12.8 (A) 05/07/2024   HCT 39 (A) 05/07/2024   MCV 97 10/22/2023   PLT 256 05/07/2024   Lab Results  Component Value Date   ALT 19 05/07/2024   AST 17 05/07/2024   ALKPHOS 86 10/22/2023   BILITOT 0.3 10/22/2023   No results found for: MARIEN BOLLS, VD25OH   Patient Active Problem List   Diagnosis Date Noted   MCI (mild cognitive impairment) 10/22/2023   Trigger little finger of right hand 04/10/2023   Diarrhea 11/14/2022   Status post laparoscopic hernia repair 02/02/2022   Low back strain, initial encounter 01/28/2021   Hand pain, right 08/23/2017   Erectile dysfunction following radiation therapy 06/26/2017   Urinary frequency 06/18/2017   Hyperlipidemia associated with type 2 diabetes mellitus (HCC) 03/12/2017   Asymptomatic PVCs 02/05/2017   Gastroesophageal reflux disease 02/05/2017   Alcohol use disorder 01/19/2016   Type II diabetes mellitus with complication (HCC) 07/26/2015   Carpal tunnel syndrome 03/30/2015   Essential hypertension 03/30/2015   Abnormal LFTs 03/30/2015   H/O adenomatous polyp of colon 03/30/2015   Tobacco use disorder, moderate, in sustained remission 03/30/2015   Prostate cancer (HCC) 09/02/2013   Posterior calcaneal exostosis 02/26/2013   Calcific Achilles tendinitis 02/26/2013  Allergies  Allergen Reactions   Ace Inhibitors Cough    Past Surgical History:  Procedure Laterality Date   APPENDECTOMY     COLONOSCOPY  2008   benign polyps   COLONOSCOPY WITH PROPOFOL  N/A 08/07/2017   Procedure: COLONOSCOPY WITH PROPOFOL ;  Surgeon: Gaylyn Gladis PENNER, MD;  Location: [facility] ENDOSCOPY;  Service: Endoscopy;  Laterality: N/A;   COLONOSCOPY WITH PROPOFOL  N/A 12/03/2020   Procedure: COLONOSCOPY WITH PROPOFOL ;  Surgeon: Maryruth Ole DASEN, MD;   Location: [facility] ENDOSCOPY;  Service: Endoscopy;  Laterality: N/A;   ESOPHAGOGASTRODUODENOSCOPY (EGD) WITH PROPOFOL  N/A 08/07/2017   Procedure: ESOPHAGOGASTRODUODENOSCOPY (EGD) WITH PROPOFOL ;  Surgeon: Gaylyn Gladis PENNER, MD;  Location: [facility] ENDOSCOPY;  Service: Endoscopy;  Laterality: N/A;   INSERTION OF MESH Right 01/16/2022   Procedure: INSERTION OF MESH;  Surgeon: Lane Shope, MD;  Location: [facility] ORS;  Service: General;  Laterality: Right;   lithopexy     PROSTATE BIOPSY  2012   malignant   REPLACEMENT TOTAL KNEE Bilateral    2009,2015    Social History   Tobacco Use   Smoking status: Former    Current packs/day: 0.00    Average packs/day: 2.5 packs/day for 16.0 years (40.0 ttl pk-yrs)    Types: Cigarettes    Start date: 70    Quit date: 82    Years since quitting: 49.7    Passive exposure: Past   Smokeless tobacco: Never  Vaping Use   Vaping status: Never Used  Substance Use Topics   Alcohol use: Not Currently    Alcohol/week: 14.0 standard drinks of alcohol    Types: 14 Shots of liquor per week    Comment: 2 scotch drinks per night, last use 09/2023   Drug use: No     Medication list has been reviewed and updated.  Current Meds  Medication Sig   cyclobenzaprine  (FLEXERIL ) 5 MG tablet Take 1 tablet (5 mg total) by mouth 3 (three) times daily as needed for muscle spasms.   denosumab  (PROLIA ) 60 MG/ML SOSY injection Inject 60 mg into the skin every 6 (six) months.   leuprolide , 6 Month, (LUPRON ) 45 MG injection Inject 45 mg into the muscle every 6 (six) months.   Multiple Vitamin (MULTIVITAMIN) tablet Take 1 tablet by mouth daily.   tamsulosin  (FLOMAX ) 0.4 MG CAPS capsule Take 1 capsule (0.4 mg total) by mouth daily.   [DISCONTINUED] metFORMIN  (GLUCOPHAGE ) 1000 MG tablet Take 1 tablet (1,000 mg total) by mouth daily with breakfast.       05/26/2024    1:35 PM 01/23/2024   11:02 AM 10/22/2023    3:16 PM 05/18/2023    3:33 PM  GAD 7 : Generalized Anxiety Score   Nervous, Anxious, on Edge 0 0 0 0  Control/stop worrying 0 0 0 0  Worry too much - different things 0 0 0 0  Trouble relaxing 0 0 0 0  Restless 0 0 0 0  Easily annoyed or irritable 0 0 0 0  Afraid - awful might happen 0 0 0 0  Total GAD 7 Score 0 0 0 0  Anxiety Difficulty Not difficult at all Not difficult at all Not difficult at all Not difficult at all       05/26/2024    1:35 PM 01/23/2024   11:02 AM 01/02/2024    1:24 PM  Depression screen PHQ 2/9  Decreased Interest 0 0 0  Down, Depressed, Hopeless 0 0 0  PHQ - 2 Score 0 0 0  Altered sleeping  0 0 0  Tired, decreased energy 0 0 0  Change in appetite 0 0 0  Feeling bad or failure about yourself  0 0 0  Trouble concentrating 0 0 0  Moving slowly or fidgety/restless 0 0 0  Suicidal thoughts 0 0 0  PHQ-9 Score 0 0 0  Difficult doing work/chores Not difficult at all Not difficult at all Not difficult at all    BP Readings from Last 3 Encounters:  05/26/24 122/68  05/08/24 118/78  05/03/24 128/70    Physical Exam Vitals and nursing note reviewed.  Constitutional:      General: He is not in acute distress.    Appearance: He is well-developed.  HENT:     Head: Normocephalic and atraumatic.  Cardiovascular:     Rate and Rhythm: Normal rate and regular rhythm.     Pulses: Normal pulses.     Heart sounds: No murmur heard. Pulmonary:     Effort: Pulmonary effort is normal. No respiratory distress.     Breath sounds: No wheezing or rhonchi.  Musculoskeletal:     Cervical back: Normal range of motion.     Lumbar back: Spasms and tenderness present. Negative right straight leg raise test and negative left straight leg raise test.     Right lower leg: No edema.     Left lower leg: No edema.  Lymphadenopathy:     Cervical: No cervical adenopathy.  Skin:    General: Skin is warm and dry.     Findings: No rash.  Neurological:     Mental Status: He is alert and oriented to person, place, and time.  Psychiatric:         Mood and Affect: Mood normal.        Behavior: Behavior normal.     Wt Readings from Last 3 Encounters:  05/26/24 194 lb (88 kg)  05/08/24 182 lb (82.6 kg)  05/03/24 180 lb (81.6 kg)    BP 122/68   Pulse 81   Ht 5' 8 (1.727 m)   Wt 194 lb (88 kg)   SpO2 98%   BMI 29.50 kg/m   Assessment and Plan:  Problem List Items Addressed This Visit       Unprioritized   Essential hypertension (Chronic)   BP controlled on no medications.      Type II diabetes mellitus with complication (HCC) - Primary (Chronic)   Blood sugars have been stable.  No hypoglycemic events since last visit. Currently medications are MTF but he has not taken any in the past month. Last visit medical regimen changes were none. Lab Results  Component Value Date   HGBA1C 6.1 (A) 01/23/2024  A1C today =  5.7.  Will remain off of metformin . Recommend follow up in 4 months with new PCP.       Relevant Orders   POCT glycosylated hemoglobin (Hb A1C) (Completed)   Other Visit Diagnoses       Sacroiliac strain, initial encounter       Recommend Tylenol  q 6-8 hours. Heat - heating pad or hot showers Flexeril  at bedtime   Relevant Medications   cyclobenzaprine  (FLEXERIL ) 5 MG tablet     Long term current use of oral hypoglycemic drug          He plans to find a PCP closer to home in [place].  No follow-ups on file.    Leita HILARIO Adie, MD [facility] [place]

## 2024-05-28 ENCOUNTER — Other Ambulatory Visit: Payer: Self-pay | Admitting: Internal Medicine

## 2024-05-28 DIAGNOSIS — E895 Postprocedural testicular hypofunction: Secondary | ICD-10-CM | POA: Diagnosis not present

## 2024-05-28 DIAGNOSIS — C61 Malignant neoplasm of prostate: Secondary | ICD-10-CM | POA: Diagnosis not present

## 2024-05-28 DIAGNOSIS — Z87891 Personal history of nicotine dependence: Secondary | ICD-10-CM | POA: Diagnosis not present

## 2024-05-28 DIAGNOSIS — K219 Gastro-esophageal reflux disease without esophagitis: Secondary | ICD-10-CM

## 2024-05-28 DIAGNOSIS — Z79818 Long term (current) use of other agents affecting estrogen receptors and estrogen levels: Secondary | ICD-10-CM | POA: Diagnosis not present

## 2024-05-28 DIAGNOSIS — Z5111 Encounter for antineoplastic chemotherapy: Secondary | ICD-10-CM | POA: Diagnosis not present

## 2024-05-29 NOTE — Telephone Encounter (Signed)
 Unable to refill per protocol, Rx expired.discontinued 05/01/24, patient preference.  Requested Prescriptions  Pending Prescriptions Disp Refills   omeprazole  (PRILOSEC) 20 MG capsule [Pharmacy Med Name: OMEPRAZOLE  DR 20 MG CAPSULE] 90 capsule 0    Sig: TAKE 1 CAPSULE BY MOUTH EVERY DAY     Gastroenterology: Proton Pump Inhibitors Passed - 05/29/2024  1:08 PM      Passed - Valid encounter within last 12 months    Recent Outpatient Visits           3 days ago Type II diabetes mellitus with complication [facility])   [facility] at [facility], Leita DEL, MD   4 months ago Type II diabetes mellitus with complication [facility])   [place] [facility] at [facility], Leita DEL, MD   7 months ago Type II diabetes mellitus with complication [facility])   [place] [facility] at [facility], Leita DEL, MD

## 2024-06-16 ENCOUNTER — Telehealth: Payer: Self-pay | Admitting: Orthopedic Surgery

## 2024-06-16 NOTE — Telephone Encounter (Signed)
 Pt wanted to ask you for a recommendation for a pcp. Not in [place], but somewhere in [place] if you have one.

## 2024-06-16 NOTE — Telephone Encounter (Signed)
 I see Dr. Sharyle Fischer DO at [facility] on [address]. Their number is [phone].

## 2024-06-17 NOTE — Telephone Encounter (Signed)
 I left the patient a detailed message with the information.

## 2024-06-30 ENCOUNTER — Other Ambulatory Visit: Payer: Self-pay | Admitting: Internal Medicine

## 2024-06-30 DIAGNOSIS — K219 Gastro-esophageal reflux disease without esophagitis: Secondary | ICD-10-CM

## 2024-07-02 DIAGNOSIS — H2513 Age-related nuclear cataract, bilateral: Secondary | ICD-10-CM | POA: Diagnosis not present

## 2024-07-02 DIAGNOSIS — H40003 Preglaucoma, unspecified, bilateral: Secondary | ICD-10-CM | POA: Diagnosis not present

## 2024-07-02 DIAGNOSIS — H35363 Drusen (degenerative) of macula, bilateral: Secondary | ICD-10-CM | POA: Diagnosis not present

## 2024-07-02 DIAGNOSIS — E119 Type 2 diabetes mellitus without complications: Secondary | ICD-10-CM | POA: Diagnosis not present

## 2024-07-02 LAB — OPHTHALMOLOGY REPORT-SCANNED

## 2024-07-02 NOTE — Telephone Encounter (Signed)
 Discontinued on 05/01/24, patient preference.  Requested Prescriptions  Pending Prescriptions Disp Refills   omeprazole  (PRILOSEC) 20 MG capsule [Pharmacy Med Name: OMEPRAZOLE  DR 20 MG CAPSULE] 90 capsule 0    Sig: TAKE 1 CAPSULE BY MOUTH EVERY DAY     Gastroenterology: Proton Pump Inhibitors Passed - 07/02/2024 11:18 AM      Passed - Valid encounter within last 12 months    Recent Outpatient Visits           1 month ago Type II diabetes mellitus with complication [facility])   [place] [facility] at [facility], Leita DEL, MD   5 months ago Type II diabetes mellitus with complication [facility])   [place] [facility] at [facility], Leita DEL, MD   8 months ago Type II diabetes mellitus with complication [facility])   [place] [facility] at [facility], Leita DEL, MD

## 2024-09-02 ENCOUNTER — Encounter: Payer: Self-pay | Admitting: Internal Medicine

## 2024-09-02 ENCOUNTER — Other Ambulatory Visit: Payer: Self-pay

## 2024-09-02 ENCOUNTER — Ambulatory Visit (INDEPENDENT_AMBULATORY_CARE_PROVIDER_SITE_OTHER): Admitting: Internal Medicine

## 2024-09-02 VITALS — BP 134/80 | HR 85 | Temp 97.9°F | Resp 16 | Ht 68.0 in | Wt 209.6 lb

## 2024-09-02 DIAGNOSIS — K219 Gastro-esophageal reflux disease without esophagitis: Secondary | ICD-10-CM

## 2024-09-02 DIAGNOSIS — E559 Vitamin D deficiency, unspecified: Secondary | ICD-10-CM | POA: Diagnosis not present

## 2024-09-02 DIAGNOSIS — E1165 Type 2 diabetes mellitus with hyperglycemia: Secondary | ICD-10-CM | POA: Diagnosis not present

## 2024-09-02 DIAGNOSIS — Z13220 Encounter for screening for lipoid disorders: Secondary | ICD-10-CM | POA: Diagnosis not present

## 2024-09-02 DIAGNOSIS — Z23 Encounter for immunization: Secondary | ICD-10-CM | POA: Diagnosis not present

## 2024-09-02 DIAGNOSIS — E782 Mixed hyperlipidemia: Secondary | ICD-10-CM | POA: Diagnosis not present

## 2024-09-02 DIAGNOSIS — Z8546 Personal history of malignant neoplasm of prostate: Secondary | ICD-10-CM | POA: Diagnosis not present

## 2024-09-02 MED ORDER — OMEPRAZOLE 40 MG PO CPDR: 40.0000 mg | DELAYED_RELEASE_CAPSULE | Freq: Every day | ORAL | 1 refills | Status: AC

## 2024-09-02 NOTE — Progress Notes (Signed)
 "  New Patient Office Visit  Subjective    Patient ID: Thomas Allen, male    DOB: 29-Apr-1941  Age: 83 y.o. MRN: [id number]  CC:  Chief Complaint  Patient presents with   Establish Care    HPI Thomas Allen presents to establish care.  Discussed the use of AI scribe software for clinical note transcription with the patient, who gave verbal consent to proceed.  History of Present Illness Thomas Allen is an 83 year old male with prostate cancer and diabetes who presents for establishment of care.  He has prostate cancer treated with Lupron  injections every six months. PSA has been undetectable for a comparable period. He previously had urinary retention requiring catheterization. He now has persistent urinary frequency despite tamsulosin  0.4 mg daily for about 1.5 to 2 years, with nocturia every two hours and voiding every 30 minutes in the morning, without meaningful improvement.  His diabetes is well controlled with A1c 5.7 for the past two years. He stopped metformin  two years ago and is not on diabetes medication.  He has high cholesterol that was slightly elevated on labs in February. He is interested in lipoprotein(a) testing due to his adopted son's high level.  He has osteoporosis and previously received Prolia  from his prostate cancer doctor. He is no longer on bone medications but continues Lupron .  He takes omeprazole  nightly for acid reflux. If he stops it for 2 to 3 days, heartburn returns and takes a few days to resolve after restarting.  He has a lump in his ear lobe that previously drained fluid when squeezed but no longer does. It is not painful.  He has high blood pressure but is not on antihypertensive medication. He stopped an ACE inhibitor in the past due to a cough.   Prostate Cancer:  -Following with Urology, last seen 05/08/24 and medical Oncology -Last PSA <0.01 9/25 -Currently on Lupron  45 mg every 6 months, Flomax  0.4 mg  Diabetes, Type 2: -Last A1c 9/25  5.7% -Medications: Nothing, -Had been on Metformin  in the past but was controlled without it -Checking BG at home: doesn't check -Eye exam: UTD -Foot exam: UTD -Microalbumin: UTD -Statin: No -PNA vaccine: Today -Denies symptoms of hypoglycemia, polyuria, polydipsia, numbness extremities, foot ulcers/trauma.   GERD: -Currently on Prilosec 40 mg daily, symptoms well controlled when taking daily  Hx of HTN: -No longer on medications, blood pressure controlled  Health Maintenance: -Blood work due -Prevnar 20 given today, declines flu vaccine  Outpatient Encounter Medications as of 09/02/2024  Medication Sig   leuprolide , 6 Month, (LUPRON ) 45 MG injection Inject 45 mg into the muscle every 6 (six) months.   cyclobenzaprine  (FLEXERIL ) 5 MG tablet Take 1 tablet (5 mg total) by mouth 3 (three) times daily as needed for muscle spasms. (Patient not taking: Reported on 09/02/2024)   denosumab  (PROLIA ) 60 MG/ML SOSY injection Inject 60 mg into the skin every 6 (six) months. (Patient not taking: Reported on 09/02/2024)   Multiple Vitamin (MULTIVITAMIN) tablet Take 1 tablet by mouth daily.   tamsulosin  (FLOMAX ) 0.4 MG CAPS capsule Take 1 capsule (0.4 mg total) by mouth daily.   No facility-administered encounter medications on file as of 09/02/2024.    Past Medical History:  Diagnosis Date   Arthritis    Cancer (HCC)    Diabetes mellitus without complication (HCC)    GERD (gastroesophageal reflux disease)    Haglund's deformity    History of kidney stones    Hyperlipidemia  Hypertension    Intractable hiccups 02/05/2017   Prostate CA [facility])    Sleep apnea     Past Surgical History:  Procedure Laterality Date   APPENDECTOMY     COLONOSCOPY  2008   benign polyps   COLONOSCOPY WITH PROPOFOL  N/A 08/07/2017   Procedure: COLONOSCOPY WITH PROPOFOL ;  Surgeon: Gaylyn Gladis PENNER, MD;  Location: [facility] ENDOSCOPY;  Service: Endoscopy;  Laterality: N/A;   COLONOSCOPY WITH PROPOFOL  N/A  12/03/2020   Procedure: COLONOSCOPY WITH PROPOFOL ;  Surgeon: Maryruth Ole DASEN, MD;  Location: [facility] ENDOSCOPY;  Service: Endoscopy;  Laterality: N/A;   ESOPHAGOGASTRODUODENOSCOPY (EGD) WITH PROPOFOL  N/A 08/07/2017   Procedure: ESOPHAGOGASTRODUODENOSCOPY (EGD) WITH PROPOFOL ;  Surgeon: Gaylyn Gladis PENNER, MD;  Location[facility] ENDOSCOPY;  Service: Endoscopy;  Laterality: N/A;   INSERTION OF MESH Right 01/16/2022   Procedure: INSERTION OF MESH;  Surgeon: Lane Shope, MD;  Location: [facility] ORS;  Service: General;  Laterality: Right;   lithopexy     PROSTATE BIOPSY  2012   malignant   REPLACEMENT TOTAL KNEE Bilateral    2009,2015    Family History  Problem Relation Age of Onset   Diabetes Mother    Colon cancer Mother    Colon cancer Father    Prostate cancer Neg Hx    Bladder Cancer Neg Hx    Kidney cancer Neg Hx     Social History   Socioeconomic History   Marital status: Married    Spouse name: Diplomatic Services Operational Officer   Number of children: 3   Years of education: Not on file   Highest education level: Master's degree (e.g., MA, MS, MEng, MEd, MSW, MBA)  Occupational History    Comment: works part time catering manager   Occupation: retired  Tobacco Use   Smoking status: Former    Current packs/day: 0.00    Average packs/day: 2.5 packs/day for 16.0 years (40.0 ttl pk-yrs)    Types: Cigarettes    Start date: 76    Quit date: 1976    Years since quitting: 50.0    Passive exposure: Past   Smokeless tobacco: Never  Vaping Use   Vaping status: Never Used  Substance and Sexual Activity   Alcohol use: Not Currently    Alcohol/week: 14.0 standard drinks of alcohol    Types: 14 Shots of liquor per week    Comment: 2 scotch drinks per night, last use 09/2023   Drug use: No   Sexual activity: Not on file  Other Topics Concern   Not on file  Social History Narrative   Lives at home with wife.   Social Drivers of Health   Tobacco Use: Medium Risk (05/28/2024)   [facility]   Patient History    Smoking Tobacco Use: Former    Smokeless Tobacco Use: Never    Passive Exposure: Not on file  Financial Resource Strain: Low Risk (01/02/2024)   Overall Financial Resource Strain (CARDIA)    Difficulty of Paying Living Expenses: Not hard at all  Food Insecurity: No Food Insecurity (01/02/2024)   Hunger Vital Sign    Worried About Running Out of Food in the Last Year: Never true    Ran Out of Food in the Last Year: Never true  Transportation Needs: No Transportation Needs (01/02/2024)   PRAPARE - Administrator, Civil Service (Medical): No    Lack of Transportation (Non-Medical): No  Physical Activity: Sufficiently Active (01/02/2024)   Exercise Vital Sign    Days of Exercise  per Week: 5 days    Minutes of Exercise per Session: 30 min  Stress: No Stress Concern Present (01/02/2024)   [employer] of Occupational Health - Occupational Stress Questionnaire    Feeling of Stress : Not at all  Social Connections: Moderately Isolated (01/02/2024)   Social Connection and Isolation Panel    Frequency of Communication with Friends and Family: More than three times a week    Frequency of Social Gatherings with Friends and Family: More than three times a week    Attends Religious Services: Never    Database Administrator or Organizations: No    Attends Banker Meetings: Never    Marital Status: Married  Catering Manager Violence: Not At Risk (01/02/2024)   Humiliation, Afraid, Rape, and Kick questionnaire    Fear of Current or Ex-Partner: No    Emotionally Abused: No    Physically Abused: No    Sexually Abused: No  Depression (PHQ2-9): Low Risk (05/26/2024)   Depression (PHQ2-9)    PHQ-2 Score: 0  Alcohol Screen: Low Risk (01/02/2024)   Alcohol Screen    Last Alcohol Screening Score (AUDIT): 4  Housing: Low Risk (01/02/2024)   Housing Stability Vital Sign    Unable to Pay for Housing in the Last Year: No    Number of Times Moved  in the Last Year: 0    Homeless in the Last Year: No  Utilities: Not At Risk (01/02/2024)   AHC Utilities    Threatened with loss of utilities: No  Health Literacy: Adequate Health Literacy (01/02/2024)   B1300 Health Literacy    Frequency of need for help with medical instructions: Never    Review of Systems  All other systems reviewed and are negative.       Objective    BP 134/80 (Cuff Size: Large)   Pulse 85   Temp 97.9 F (36.6 C) (Oral)   Resp 16   Ht 5' 8 (1.727 m)   Wt 209 lb 9.6 oz (95.1 kg)   SpO2 98%   BMI 31.87 kg/m   Physical Exam Constitutional:      Appearance: Normal appearance.  HENT:     Head: Normocephalic and atraumatic.     Ears:     Comments: Cyst on left lobule     Mouth/Throat:     Mouth: Mucous membranes are moist.     Pharynx: Oropharynx is clear.  Eyes:     Extraocular Movements: Extraocular movements intact.     Conjunctiva/sclera: Conjunctivae normal.     Pupils: Pupils are equal, round, and reactive to light.  Neck:     Comments: No thyromegaly Cardiovascular:     Rate and Rhythm: Normal rate and regular rhythm.  Pulmonary:     Effort: Pulmonary effort is normal.     Breath sounds: Normal breath sounds.  Musculoskeletal:     Cervical back: No tenderness.     Right lower leg: No edema.     Left lower leg: No edema.  Lymphadenopathy:     Cervical: No cervical adenopathy.  Skin:    General: Skin is warm and dry.  Neurological:     General: No focal deficit present.     Mental Status: He is alert. Mental status is at baseline.  Psychiatric:        Mood and Affect: Mood normal.        Behavior: Behavior normal.         Assessment & Plan:  Assessment & Plan Adult Wellness Visit Blood pressure slightly elevated at 134/80, likely stress-related. Discussed pneumonia vaccine due to immunocompromised status from diabetes and prostate cancer history. - Administered pneumonia vaccine today. - Ordered labs including A1c,  cholesterol, lipoprotein A, kidney and liver function, anemia panel, and vitamin D. - Scheduled follow-up in 6 months.  Type 2 diabetes mellitus w/Hyperglycemia  Well-controlled with A1c at 5.7 for the past two years. No current medication as lifestyle changes have maintained control. - Ordered A1c test.  Benign prostatic hyperplasia with lower urinary tract symptoms Persistent frequent urination despite tamulosin 0.4 mg. Will discuss increasing tamulosin to 0.8 mg with Urologist.   History of prostate cancer Prostate cancer in remission with undetectable PSA levels. Continues on Lupron  injections every six months. No bone metastasis or current bone health issues. - Continue Lupron  injections every six months.  Gastroesophageal reflux disease GERD managed with omeprazole  taken nightly. Symptoms recur if medication is missed for 2-3 days. - Refilled omeprazole  prescription.  Hyperlipidemia Cholesterol slightly elevated last checked in February. Discussed potential genetic component with family history of high lipoprotein A. No current statin therapy due to low risk and potential side effects. - Ordered cholesterol, Lipoprotein A test not covered. - Discussed dietary modifications.  - CBC w/Diff/Platelet - Comprehensive Metabolic Panel (CMET) - omeprazole  (PRILOSEC) 40 MG capsule; Take 1 capsule (40 mg total) by mouth daily.  Dispense: 90 capsule; Refill: 1 - Vitamin D (25 hydroxy) - Pneumococcal conjugate vaccine 20-valent (Prevnar 20) - Lipid Profile  Return in about 6 months (around 03/03/2025).   Sharyle Fischer, DO   "

## 2024-09-03 ENCOUNTER — Ambulatory Visit: Payer: Self-pay | Admitting: Internal Medicine

## 2024-09-03 DIAGNOSIS — E559 Vitamin D deficiency, unspecified: Secondary | ICD-10-CM

## 2024-09-03 LAB — COMPREHENSIVE METABOLIC PANEL WITH GFR
AG Ratio: 1.4 (calc) (ref 1.0–2.5)
ALT: 21 U/L (ref 9–46)
AST: 16 U/L (ref 10–35)
Albumin: 4 g/dL (ref 3.6–5.1)
Alkaline phosphatase (APISO): 67 U/L (ref 35–144)
BUN/Creatinine Ratio: 26 (calc) — ABNORMAL HIGH (ref 6–22)
BUN: 32 mg/dL — ABNORMAL HIGH (ref 7–25)
CO2: 29 mmol/L (ref 20–32)
Calcium: 9.4 mg/dL (ref 8.6–10.3)
Chloride: 102 mmol/L (ref 98–110)
Creat: 1.21 mg/dL (ref 0.70–1.22)
Globulin: 2.9 g/dL (ref 1.9–3.7)
Glucose, Bld: 113 mg/dL — ABNORMAL HIGH (ref 65–99)
Potassium: 4.7 mmol/L (ref 3.5–5.3)
Sodium: 137 mmol/L (ref 135–146)
Total Bilirubin: 0.5 mg/dL (ref 0.2–1.2)
Total Protein: 6.9 g/dL (ref 6.1–8.1)
eGFR: 60 mL/min/1.73m2

## 2024-09-03 LAB — CBC WITH DIFFERENTIAL/PLATELET
Absolute Lymphocytes: 1664 {cells}/uL (ref 850–3900)
Absolute Monocytes: 808 {cells}/uL (ref 200–950)
Basophils Absolute: 38 {cells}/uL (ref 0–200)
Basophils Relative: 0.4 %
Eosinophils Absolute: 235 {cells}/uL (ref 15–500)
Eosinophils Relative: 2.5 %
HCT: 39.5 % (ref 39.4–51.1)
Hemoglobin: 13.1 g/dL — ABNORMAL LOW (ref 13.2–17.1)
MCH: 30.8 pg (ref 27.0–33.0)
MCHC: 33.2 g/dL (ref 31.6–35.4)
MCV: 92.7 fL (ref 81.4–101.7)
MPV: 11.4 fL (ref 7.5–12.5)
Monocytes Relative: 8.6 %
Neutro Abs: 6655 {cells}/uL (ref 1500–7800)
Neutrophils Relative %: 70.8 %
Platelets: 232 Thousand/uL (ref 140–400)
RBC: 4.26 Million/uL (ref 4.20–5.80)
RDW: 13.2 % (ref 11.0–15.0)
Total Lymphocyte: 17.7 %
WBC: 9.4 Thousand/uL (ref 3.8–10.8)

## 2024-09-03 LAB — LIPID PANEL
Cholesterol: 164 mg/dL
HDL: 33 mg/dL — ABNORMAL LOW
LDL Cholesterol (Calc): 87 mg/dL
Non-HDL Cholesterol (Calc): 131 mg/dL — ABNORMAL HIGH
Total CHOL/HDL Ratio: 5 (calc) — ABNORMAL HIGH
Triglycerides: 333 mg/dL — ABNORMAL HIGH

## 2024-09-03 LAB — VITAMIN D 25 HYDROXY (VIT D DEFICIENCY, FRACTURES): Vit D, 25-Hydroxy: 27 ng/mL — ABNORMAL LOW (ref 30–100)

## 2024-09-03 MED ORDER — VITAMIN D (ERGOCALCIFEROL) 1.25 MG (50000 UNIT) PO CAPS: 50000.0000 [IU] | ORAL_CAPSULE | ORAL | 0 refills | Status: AC

## 2024-09-08 ENCOUNTER — Other Ambulatory Visit: Payer: Self-pay | Admitting: Internal Medicine

## 2024-09-08 ENCOUNTER — Telehealth: Payer: Self-pay

## 2024-09-08 DIAGNOSIS — R339 Retention of urine, unspecified: Secondary | ICD-10-CM

## 2024-09-08 NOTE — Telephone Encounter (Signed)
 Pt.notified

## 2024-09-08 NOTE — Telephone Encounter (Signed)
 Copied from CRM [id number]. Topic: Clinical - Medical Advice >> Sep 08, 2024 11:11 AM Burnard DEL wrote: Reason for CRM: Patient called in stating that he was prescribed tamsulosin  (FLOMAX ) 0.4 MG CAPS capsule for his urinary issue that he's having,however it does not seem to be working. He stated that he is still having very frequent urination. No pain,just very frequent.

## 2024-09-22 ENCOUNTER — Telehealth: Payer: Self-pay

## 2024-09-22 NOTE — Progress Notes (Unsigned)
 "    09/23/2024 1:22 PM   Thomas Allen May 15, 1941 [id number]  Referring provider: Bernardo Fend, DO [address] [place],  [place] [address]  Urological history: 1. Metastatic prostate cancer - PSA (11/2023) <0.01 - followed by [facility]  2. Pyuria -cysto (12/2022) meatal stenosis/radiation changes -urine cytology (04/2023) negative  No chief complaint on file.  HPI: Thomas Allen is a 84 y.o. man who presents today for one month follow up his wife, Deane.    Previous records reviewed.   He had an episode of urinary retention back in the fall and he passed a voiding trial.  He is now having urinary frequency.    I PSS ***  He reports sensation of incomplete bladder emptying,   urinary frequency,   urinary intermittency,   urinary urgency,   a weak urinary stream,   having to strain to void,   nocturia x ***,   leaking before being able to reach the restroom,   leaking with coughing,   leaking without awareness,   and post void dribbling.     He is wearing *** pads//depends  daily.    Patient denies any modifying or aggravating factors.  Patient denies any recent UTI's, gross hematuria, dysuria or suprapubic/flank pain.  Patient denies any fevers, chills, nausea or vomiting.  ***  He has a family history of PCa, colon cancer, ovarian cancer and/or breast cancer with ***.   He does not have a family history of PCa, colon cancer, ovarian cancer, and/or breast cancer .***     UA***  PVR***  PSA (05/2024) <0.01  Serum creatinine (08/2024) 1.21  Hemoglobin A1c (05/2024) 5.7   Diuretics:  ***  BPH meds: tamsulosin  0.8 mg daily   Fluid consumption: ***  PMH: Past Medical History:  Diagnosis Date   Arthritis    Cancer (HCC)    Diabetes mellitus without complication (HCC)    GERD (gastroesophageal reflux disease)    Haglund's deformity    History of kidney stones    Hyperlipidemia    Hypertension    Intractable hiccups 02/05/2017    Prostate CA (HCC)    Sleep apnea     Surgical History: Past Surgical History:  Procedure Laterality Date   APPENDECTOMY     COLONOSCOPY  2008   benign polyps   COLONOSCOPY WITH PROPOFOL  N/A 08/07/2017   Procedure: COLONOSCOPY WITH PROPOFOL ;  Surgeon: Gaylyn Gladis PENNER, MD;  Location: [facility] ENDOSCOPY;  Service: Endoscopy;  Laterality: N/A;   COLONOSCOPY WITH PROPOFOL  N/A 12/03/2020   Procedure: COLONOSCOPY WITH PROPOFOL ;  Surgeon: Maryruth Ole DASEN, MD;  Location: [facility] ENDOSCOPY;  Service: Endoscopy;  Laterality: N/A;   ESOPHAGOGASTRODUODENOSCOPY (EGD) WITH PROPOFOL  N/A 08/07/2017   Procedure: ESOPHAGOGASTRODUODENOSCOPY (EGD) WITH PROPOFOL ;  Surgeon: Gaylyn Gladis PENNER, MD;  Location: [facility] ENDOSCOPY;  Service: Endoscopy;  Laterality: N/A;   INSERTION OF MESH Right 01/16/2022   Procedure: INSERTION OF MESH;  Surgeon: Lane Shope, MD;  Location: [facility] ORS;  Service: General;  Laterality: Right;   lithopexy     PROSTATE BIOPSY  2012   malignant   REPLACEMENT TOTAL KNEE Bilateral    [id number]    Home Medications:  Allergies as of 09/23/2024       Reactions   Ace Inhibitors Cough        Medication List        Accurate as of September 22, 2024  1:22 PM. If you have any questions, ask your nurse or doctor.  leuprolide  (6 Month) 45 MG injection Commonly known as: LUPRON  Inject 45 mg into the muscle every 6 (six) months.   multivitamin tablet Take 1 tablet by mouth daily.   omeprazole  40 MG capsule Commonly known as: PRILOSEC Take 1 capsule (40 mg total) by mouth daily.   tamsulosin  0.4 MG Caps capsule Commonly known as: FLOMAX  Take 1 capsule (0.4 mg total) by mouth daily.   Vitamin D  (Ergocalciferol ) 1.25 MG (50000 UNIT) Caps capsule Commonly known as: DRISDOL  Take 1 capsule (50,000 Units total) by mouth every 7 (seven) days.        Allergies:  Allergies  Allergen Reactions   Ace Inhibitors Cough    Family History: Family History  Problem  Relation Age of Onset   Diabetes Mother    Colon cancer Mother    Colon cancer Father    Prostate cancer Neg Hx    Bladder Cancer Neg Hx    Kidney cancer Neg Hx     Social History: See HPI for pertinent social history  ROS: Pertinent ROS in HPI  Physical Exam: There were no vitals taken for this visit.  Constitutional:  Well nourished. Alert and oriented, No acute distress. HEENT: [place] AT, moist mucus membranes.  Trachea midline, no masses. Cardiovascular: No clubbing, cyanosis, or edema. Respiratory: Normal respiratory effort, no increased work of breathing. GI: Abdomen is soft, non tender, non distended, no abdominal masses. Liver and spleen not palpable.  No hernias appreciated.  Stool sample for occult testing is not indicated.   GU: No CVA tenderness.  No bladder fullness or masses.  Patient with circumcised/uncircumcised phallus. ***Foreskin easily retracted***  Urethral meatus is patent.  No penile discharge. No penile lesions or rashes. Scrotum without lesions, cysts, rashes and/or edema.  Testicles are located scrotally bilaterally. No masses are appreciated in the testicles. Left and right epididymis are normal. Rectal: Patient with  normal sphincter tone. Anus and perineum without scarring or rashes. No rectal masses are appreciated. Prostate is approximately *** grams, *** nodules are appreciated. Seminal vesicles are normal. Skin: No rashes, bruises or suspicious lesions. Lymph: No cervical or inguinal adenopathy. Neurologic: Grossly intact, no focal deficits, moving all 4 extremities. Psychiatric: Normal mood and affect.   Laboratory Data: See EPIC and HPI  I have reviewed the labs.   Pertinent Imaging: ***  Assessment & Plan:    1. Urinary retention - ***  2. Metastatic prostate cancer - PSA undetectable - follow by [facility]  No follow-ups on file.  These notes generated with voice recognition software. I apologize for typographical errors.  Thomas Allen  [facility] [address]  [address] [place], [place] [address] [phone]  Next morning:  Spoke with Thomas Allen and he was not taking the Levaquin , so I resent the prescription.  He states he is still feeling like he is empyting his bladder.  He is having a little urge incontinence which I explained was normal.   He also asked about yogurt, but I advised him that calcium can cause the Levaquin  less effective, so to space it out by at least 8 hours.  "

## 2024-09-22 NOTE — Telephone Encounter (Signed)
 Patient called in with c/o increased urinary frequency. Pt states no pain, fevers. Pt wanted antibiotics but I explained we have to test his urine and see if this is not an issue with incomplete emptying versus a infection. I scheduled him for a same day for tomorrow with a PA. Pt voiced understanding.

## 2024-09-23 ENCOUNTER — Encounter: Payer: Self-pay | Admitting: Urology

## 2024-09-23 ENCOUNTER — Ambulatory Visit: Admitting: Urology

## 2024-09-23 ENCOUNTER — Telehealth: Payer: Self-pay

## 2024-09-23 VITALS — BP 96/50 | HR 88 | Wt 184.0 lb

## 2024-09-23 DIAGNOSIS — R3989 Other symptoms and signs involving the genitourinary system: Secondary | ICD-10-CM

## 2024-09-23 DIAGNOSIS — R339 Retention of urine, unspecified: Secondary | ICD-10-CM | POA: Diagnosis not present

## 2024-09-23 DIAGNOSIS — R350 Frequency of micturition: Secondary | ICD-10-CM | POA: Diagnosis not present

## 2024-09-23 LAB — URINALYSIS, COMPLETE
Bilirubin, UA: NEGATIVE
Glucose, UA: NEGATIVE
Ketones, UA: NEGATIVE
Nitrite, UA: NEGATIVE
Specific Gravity, UA: 1.02 (ref 1.005–1.030)
Urobilinogen, Ur: 0.2 mg/dL (ref 0.2–1.0)
pH, UA: 5.5 (ref 5.0–7.5)

## 2024-09-23 LAB — BLADDER SCAN AMB NON-IMAGING

## 2024-09-23 LAB — MICROSCOPIC EXAMINATION: WBC, UA: >30 /HPF — AB (ref 0–5)

## 2024-09-23 MED ORDER — LEVOFLOXACIN 500 MG PO TABS: 500.0000 mg | ORAL_TABLET | Freq: Every day | ORAL | 0 refills | Status: AC

## 2024-09-23 NOTE — Telephone Encounter (Signed)
 Pt called triage reporting that after taking the medication prescribed today, his hands began itching severely; symptoms lasted approximately one hour. Pt was advised to stop taking Levoquin at this time. Will message Clotilda for further recommendations and will update pt once guidance is received. Pt voiced understanding.

## 2024-09-23 NOTE — Telephone Encounter (Signed)
 LVM for pt to return call.   Per shannon, we will wait on urine culture results to prescribe another antibiotic.

## 2024-09-24 NOTE — Telephone Encounter (Signed)
 Pt LM on triage line requesting what he needed to do regarding ATB.   Pt aware per SM we will wait for ucx before rxing another ATB, Advised to not take Levaquin  at this time.   Pt voiced understanding. Aware it may be 3-7 days for ucx results.

## 2024-09-26 ENCOUNTER — Ambulatory Visit: Payer: Self-pay | Admitting: Urology

## 2024-09-26 LAB — CULTURE, URINE COMPREHENSIVE

## 2024-09-26 MED ORDER — OXYBUTYNIN CHLORIDE 5 MG PO TABS: 5.0000 mg | ORAL_TABLET | Freq: Every day | ORAL | 1 refills | Status: DC

## 2024-09-26 NOTE — Telephone Encounter (Signed)
-----   Message from [facility] sent at 09/26/2024  8:18 AM EST ----- Please let [name] know that his urine culture was negative for infection, so we do not need to start any antibiotics at this time.  I would recommend that we try a very low dose over active  bladder agent to see if this helps with his symptoms.  If he would like, we can send the script to his pharmacy.  I would not start it until Monday, because if the medication causes his bladder to  relax too much, I do not want him out on the roads going to the ED in the inclement weather they are predicting this weekend.  The medication will be oxybutynin  XL 5 mg daily.

## 2024-09-26 NOTE — Telephone Encounter (Signed)
-----   Message from [facility] sent at 09/26/2024  8:18 AM EST ----- Please let Thomas Allen know that his urine culture was negative for infection, so we do not need to start any antibiotics at this time.  I would recommend that we try a very low dose over active  bladder agent to see if this helps with his symptoms.  If he would like, we can send the script to his pharmacy.  I would not start it until Monday, because if the medication causes his bladder to  relax too much, I do not want him out on the roads going to the ED in the inclement weather they are predicting this weekend.  The medication will be oxybutynin XL 5 mg daily.

## 2024-09-26 NOTE — Telephone Encounter (Signed)
 Spoke with patient in regards to negative urine culture. Advised that provider recommends starting Oxybutinin 5mg  tab daily to help with over active bladder symptoms. Patient verbalized understanding of information given.  Andrea Kirks LPN

## 2024-09-26 NOTE — Addendum Note (Signed)
 Addended by: Clayvon Parlett A on: 09/26/2024 08:43 AM   Modules accepted: Orders

## 2024-10-07 ENCOUNTER — Telehealth: Payer: Self-pay

## 2024-10-07 NOTE — Telephone Encounter (Signed)
 Pt called in with c/o frequency of urination. He was instructed by on call physician to stop taking his oxybutynin . Pt states urine is clear but he goes every 30 minutes with little output when he goes. He states not being in retention now. We discussed that he may need to have a PVR done here sooner that his end of the month appt so I moved his appt up. Pt voiced understanding. He is aware to go to ER if he goes into retention.

## 2024-10-09 ENCOUNTER — Ambulatory Visit: Admitting: Urology

## 2024-10-09 VITALS — BP 105/68 | HR 78 | Wt 184.0 lb

## 2024-10-09 DIAGNOSIS — R3915 Urgency of urination: Secondary | ICD-10-CM

## 2024-10-09 DIAGNOSIS — R3129 Other microscopic hematuria: Secondary | ICD-10-CM

## 2024-10-09 LAB — MICROSCOPIC EXAMINATION: WBC, UA: >30 /HPF — AB (ref 0–5)

## 2024-10-09 LAB — URINALYSIS, COMPLETE
Bilirubin, UA: NEGATIVE
Glucose, UA: NEGATIVE
Ketones, UA: NEGATIVE
Nitrite, UA: NEGATIVE
Protein,UA: NEGATIVE
Specific Gravity, UA: 1.015 (ref 1.005–1.030)
Urobilinogen, Ur: 0.2 mg/dL (ref 0.2–1.0)
pH, UA: 6 (ref 5.0–7.5)

## 2024-10-09 LAB — BLADDER SCAN AMB NON-IMAGING

## 2024-10-09 MED ORDER — OXYBUTYNIN CHLORIDE 5 MG PO TABS: ORAL_TABLET | ORAL | 2 refills | Status: AC

## 2024-10-09 NOTE — Patient Instructions (Signed)
 Please call [phone] to schedule the CT scan

## 2024-10-16 ENCOUNTER — Ambulatory Visit

## 2024-10-27 ENCOUNTER — Ambulatory Visit: Admitting: Urology

## 2025-01-07 ENCOUNTER — Ambulatory Visit

## 2025-03-03 ENCOUNTER — Ambulatory Visit: Admitting: Internal Medicine
